# Patient Record
Sex: Female | Born: 1990 | Race: Black or African American | Hispanic: No | Marital: Single | Smoking: Never smoker
Health system: Southern US, Community
[De-identification: ages and names within clinical notes are randomized; demographics above are authoritative.]

## PROBLEM LIST (undated history)

## (undated) ENCOUNTER — Inpatient Hospital Stay (HOSPITAL_COMMUNITY): Payer: Self-pay

## (undated) DIAGNOSIS — N3090 Cystitis, unspecified without hematuria: Secondary | ICD-10-CM

## (undated) DIAGNOSIS — I10 Essential (primary) hypertension: Secondary | ICD-10-CM

## (undated) DIAGNOSIS — L509 Urticaria, unspecified: Secondary | ICD-10-CM

## (undated) DIAGNOSIS — IMO0002 Reserved for concepts with insufficient information to code with codable children: Secondary | ICD-10-CM

## (undated) DIAGNOSIS — O1490 Unspecified pre-eclampsia, unspecified trimester: Secondary | ICD-10-CM

## (undated) DIAGNOSIS — T7840XA Allergy, unspecified, initial encounter: Secondary | ICD-10-CM

## (undated) DIAGNOSIS — N390 Urinary tract infection, site not specified: Secondary | ICD-10-CM

## (undated) HISTORY — DX: Unspecified pre-eclampsia, unspecified trimester: O14.90

## (undated) HISTORY — DX: Reserved for concepts with insufficient information to code with codable children: IMO0002

## (undated) HISTORY — PX: TONSILLECTOMY: SUR1361

## (undated) HISTORY — DX: Cystitis, unspecified without hematuria: N30.90

## (undated) HISTORY — DX: Urinary tract infection, site not specified: N39.0

## (undated) HISTORY — DX: Allergy, unspecified, initial encounter: T78.40XA

## (undated) HISTORY — PX: LAPAROSCOPIC GASTRIC SLEEVE RESECTION: SHX5895

## (undated) HISTORY — DX: Urticaria, unspecified: L50.9

---
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
## 2009-01-11 ENCOUNTER — Emergency Department (HOSPITAL_BASED_OUTPATIENT_CLINIC_OR_DEPARTMENT_OTHER): Admission: EM | Admit: 2009-01-11 | Discharge: 2009-01-11 | Payer: Self-pay | Admitting: Emergency Medicine

## 2009-08-22 ENCOUNTER — Emergency Department (HOSPITAL_BASED_OUTPATIENT_CLINIC_OR_DEPARTMENT_OTHER): Admission: EM | Admit: 2009-08-22 | Discharge: 2009-08-22 | Payer: Self-pay | Admitting: Emergency Medicine

## 2009-08-22 ENCOUNTER — Ambulatory Visit: Payer: Self-pay | Admitting: Diagnostic Radiology

## 2010-02-03 ENCOUNTER — Emergency Department (HOSPITAL_COMMUNITY): Admission: EM | Admit: 2010-02-03 | Discharge: 2010-02-03 | Payer: Self-pay | Admitting: Emergency Medicine

## 2010-07-11 ENCOUNTER — Emergency Department (HOSPITAL_BASED_OUTPATIENT_CLINIC_OR_DEPARTMENT_OTHER): Admission: EM | Admit: 2010-07-11 | Discharge: 2010-07-11 | Payer: Self-pay | Admitting: Emergency Medicine

## 2010-07-11 ENCOUNTER — Ambulatory Visit: Payer: Self-pay | Admitting: Diagnostic Radiology

## 2010-08-02 ENCOUNTER — Emergency Department (HOSPITAL_BASED_OUTPATIENT_CLINIC_OR_DEPARTMENT_OTHER): Admission: EM | Admit: 2010-08-02 | Discharge: 2010-08-02 | Payer: Self-pay | Admitting: Emergency Medicine

## 2010-12-20 LAB — RAPID STREP SCREEN (MED CTR MEBANE ONLY): Streptococcus, Group A Screen (Direct): NEGATIVE

## 2010-12-26 LAB — URINALYSIS, ROUTINE W REFLEX MICROSCOPIC
Bilirubin Urine: NEGATIVE
Glucose, UA: NEGATIVE mg/dL
Ketones, ur: NEGATIVE mg/dL
Nitrite: NEGATIVE
Protein, ur: NEGATIVE mg/dL
Specific Gravity, Urine: 1.018 (ref 1.005–1.030)
Urobilinogen, UA: 1 mg/dL (ref 0.0–1.0)
pH: 7.5 (ref 5.0–8.0)

## 2010-12-26 LAB — URINE CULTURE: Colony Count: >=100000

## 2010-12-26 LAB — URINE MICROSCOPIC-ADD ON

## 2010-12-26 LAB — POCT PREGNANCY, URINE: Preg Test, Ur: NEGATIVE

## 2011-01-17 LAB — RAPID STREP SCREEN (MED CTR MEBANE ONLY): Streptococcus, Group A Screen (Direct): NEGATIVE

## 2011-07-05 LAB — URINE MICROSCOPIC-ADD ON

## 2011-07-05 LAB — BASIC METABOLIC PANEL
BUN: 4 — ABNORMAL LOW
CO2: 22
Calcium: 9.1
Chloride: 106
Creatinine, Ser: 0.6
Glucose, Bld: 90
Potassium: 3.4 — ABNORMAL LOW
Sodium: 137

## 2011-07-05 LAB — URINALYSIS, ROUTINE W REFLEX MICROSCOPIC
Bilirubin Urine: NEGATIVE
Glucose, UA: NEGATIVE
Ketones, ur: NEGATIVE
Nitrite: NEGATIVE
Protein, ur: NEGATIVE
Specific Gravity, Urine: 1.023
Urobilinogen, UA: 0.2
pH: 6

## 2011-07-05 LAB — CBC
HCT: 34.1 — ABNORMAL LOW
Hemoglobin: 12
MCHC: 35
MCV: 83.4
Platelets: 199
RBC: 4.09
RDW: 13
WBC: 7.5

## 2011-07-05 LAB — DIFFERENTIAL
Basophils Absolute: 0
Basophils Relative: 1
Eosinophils Absolute: 0.2
Eosinophils Relative: 3
Lymphocytes Relative: 22 — ABNORMAL LOW
Lymphs Abs: 1.7
Monocytes Absolute: 0.3
Monocytes Relative: 5
Neutro Abs: 5.3
Neutrophils Relative %: 70

## 2011-07-06 LAB — URINALYSIS, ROUTINE W REFLEX MICROSCOPIC
Bilirubin Urine: NEGATIVE
Glucose, UA: NEGATIVE
Hgb urine dipstick: NEGATIVE
Ketones, ur: NEGATIVE
Nitrite: NEGATIVE
Protein, ur: NEGATIVE
Specific Gravity, Urine: 1.02
Urobilinogen, UA: 0.2
pH: 6.5

## 2011-07-06 LAB — COMPREHENSIVE METABOLIC PANEL
ALT: 23
AST: 22
Albumin: 2.7 — ABNORMAL LOW
Alkaline Phosphatase: 57
BUN: 2 — ABNORMAL LOW
CO2: 22
Calcium: 8.9
Chloride: 104
Creatinine, Ser: 0.42
Glucose, Bld: 109 — ABNORMAL HIGH
Potassium: 3.3 — ABNORMAL LOW
Sodium: 134 — ABNORMAL LOW
Total Bilirubin: 0.4
Total Protein: 6

## 2011-07-06 LAB — CBC
HCT: 33.3 — ABNORMAL LOW
Hemoglobin: 11.3 — ABNORMAL LOW
MCHC: 34
MCV: 87.5
Platelets: 201
RBC: 3.8
RDW: 14.7
WBC: 7.7

## 2011-07-06 LAB — URIC ACID: Uric Acid, Serum: 3.3

## 2011-07-09 LAB — URINALYSIS, ROUTINE W REFLEX MICROSCOPIC
Bilirubin Urine: NEGATIVE
Glucose, UA: NEGATIVE
Ketones, ur: NEGATIVE
Nitrite: NEGATIVE
Protein, ur: NEGATIVE
Specific Gravity, Urine: 1.02
Urobilinogen, UA: 0.2
pH: 6

## 2011-07-09 LAB — URINE CULTURE

## 2011-07-09 LAB — URINE MICROSCOPIC-ADD ON

## 2011-07-10 LAB — DIFFERENTIAL
Basophils Absolute: 0
Basophils Relative: 0
Eosinophils Absolute: 0.1
Eosinophils Relative: 1
Lymphs Abs: 1.4
Monocytes Relative: 10
Neutrophils Relative %: 76

## 2011-07-10 LAB — CBC
MCHC: 32.9
MCV: 81
RBC: 3.95
RDW: 15.3

## 2011-07-10 LAB — BASIC METABOLIC PANEL
BUN: 11
CO2: 25
Calcium: 8.8
Chloride: 103
Creatinine, Ser: 0.7
Glucose, Bld: 77

## 2011-12-20 ENCOUNTER — Emergency Department (HOSPITAL_BASED_OUTPATIENT_CLINIC_OR_DEPARTMENT_OTHER)
Admission: EM | Admit: 2011-12-20 | Discharge: 2011-12-20 | Disposition: A | Discharge status: Home / Self Care | Payer: Medicaid Other | Attending: Emergency Medicine | Admitting: Emergency Medicine

## 2011-12-20 ENCOUNTER — Encounter (HOSPITAL_BASED_OUTPATIENT_CLINIC_OR_DEPARTMENT_OTHER): Payer: Self-pay | Admitting: Family Medicine

## 2011-12-20 ENCOUNTER — Emergency Department (INDEPENDENT_AMBULATORY_CARE_PROVIDER_SITE_OTHER): Payer: Medicaid Other

## 2011-12-20 DIAGNOSIS — R52 Pain, unspecified: Secondary | ICD-10-CM

## 2011-12-20 DIAGNOSIS — J069 Acute upper respiratory infection, unspecified: Secondary | ICD-10-CM

## 2011-12-20 DIAGNOSIS — R0989 Other specified symptoms and signs involving the circulatory and respiratory systems: Secondary | ICD-10-CM

## 2011-12-20 DIAGNOSIS — H6690 Otitis media, unspecified, unspecified ear: Secondary | ICD-10-CM | POA: Insufficient documentation

## 2011-12-20 DIAGNOSIS — R059 Cough, unspecified: Secondary | ICD-10-CM | POA: Insufficient documentation

## 2011-12-20 DIAGNOSIS — R05 Cough: Secondary | ICD-10-CM

## 2011-12-20 DIAGNOSIS — H9209 Otalgia, unspecified ear: Secondary | ICD-10-CM

## 2011-12-20 DIAGNOSIS — H6692 Otitis media, unspecified, left ear: Secondary | ICD-10-CM

## 2011-12-20 LAB — CBC
MCH: 28.3 pg (ref 26.0–34.0)
MCHC: 34.4 g/dL (ref 30.0–36.0)
Platelets: 206 10*3/uL (ref 150–400)
RBC: 4.91 MIL/uL (ref 3.87–5.11)

## 2011-12-20 LAB — BASIC METABOLIC PANEL
CO2: 28 mEq/L (ref 19–32)
Calcium: 9.5 mg/dL (ref 8.4–10.5)
GFR calc non Af Amer: >90 mL/min (ref >90)
Sodium: 138 mEq/L (ref 135–145)

## 2011-12-20 LAB — URINALYSIS, ROUTINE W REFLEX MICROSCOPIC
Ketones, ur: NEGATIVE mg/dL
Leukocytes, UA: NEGATIVE
Nitrite: NEGATIVE
Protein, ur: NEGATIVE mg/dL
Urobilinogen, UA: 0.2 mg/dL (ref 0.0–1.0)

## 2011-12-20 LAB — PREGNANCY, URINE: Preg Test, Ur: NEGATIVE

## 2011-12-20 MED ORDER — NAPROXEN 500 MG PO TABS: 500.0000 mg | ORAL_TABLET | Freq: Two times a day (BID) | ORAL | Status: DC

## 2011-12-20 MED ORDER — AZITHROMYCIN 250 MG PO TABS: 250.0000 mg | ORAL_TABLET | Freq: Every day | ORAL | Status: AC

## 2011-12-20 NOTE — ED Provider Notes (Addendum)
History     CSN: [id number]  Arrival date & time 12/20/11  1805   First MD Initiated Contact with Patient 12/20/11 1823      Chief Complaint  Patient presents with  . Otalgia  . Generalized Body Aches  . Cough  . Nasal Congestion    (Consider location/radiation/quality/duration/timing/severity/associated sxs/prior treatment) Patient is a 21 y.o. female presenting with ear pain and cough. The history is provided by the patient.  Otalgia This is a new problem. The current episode started more than 2 days ago (5 days ago). There is pain in the left ear. The problem occurs constantly. The problem has been gradually worsening. There has been no fever. The pain is at a severity of 2/10. The pain is mild. Associated symptoms include headaches, sore throat and cough. Pertinent negatives include no ear discharge, no abdominal pain, no diarrhea, no vomiting, no neck pain and no rash.  Cough Associated symptoms include chest pain, ear pain, headaches, sore throat and myalgias. Pertinent negatives include no chills and no eye redness.   Patient with onset of upper respiratory viral infection symptoms 5 days ago she has some mild headache bodyaches congestion left ear pain and upper chest is burning when she breathes no true fever but she does get sweaty no chills.  History reviewed. No pertinent past medical history.  History reviewed. No pertinent past surgical history.  No family history on file.  History  Substance Use Topics  . Smoking status: Never Smoker   . Smokeless tobacco: Not on file  . Alcohol Use: No    OB History    Grav Para Term Preterm Abortions TAB SAB Ect Mult Living                  Review of Systems  Constitutional: Positive for diaphoresis and fatigue. Negative for fever and chills.  HENT: Positive for ear pain, congestion and sore throat. Negative for neck pain and ear discharge.   Eyes: Negative for redness.  Respiratory: Positive for cough.     Cardiovascular: Positive for chest pain.  Gastrointestinal: Negative for nausea, vomiting, abdominal pain and diarrhea.  Genitourinary: Negative for dysuria.  Musculoskeletal: Positive for myalgias and back pain.  Skin: Negative for rash.  Neurological: Positive for headaches.  Hematological: Does not bruise/bleed easily.    Allergies  Review of patient's allergies indicates no known allergies.  Home Medications   Current Outpatient Rx  Name Route Sig Dispense Refill  . ADULT MULTIVITAMIN W/MINERALS CH Oral Take 1 tablet by mouth daily.    Marland Kitchen PSEUDOEPHEDRINE-APAP-DM 30-865-78 MG/30ML PO LIQD Oral Take 10 mLs by mouth daily as needed. Patient used this medication for cold.    . AZITHROMYCIN 250 MG PO TABS Oral Take 1 tablet (250 mg total) by mouth daily. Take first 2 tablets together, then 1 every day until finished. 6 tablet 0  . NAPROXEN 500 MG PO TABS Oral Take 1 tablet (500 mg total) by mouth 2 (two) times daily. 14 tablet 0    BP 140/79  Pulse 71  Temp(Src) 97.7 F (36.5 C) (Oral)  Resp 16  Ht 5\' 6"  (1.676 m)  Wt 250 lb (113.399 kg)  BMI 40.35 kg/m2  SpO2 100%  LMP 12/06/2011  Physical Exam  Nursing note and vitals reviewed. Constitutional: She is oriented to person, place, and time. She appears well-developed and well-nourished. No distress.  HENT:  Head: Normocephalic and atraumatic.  Mouth/Throat: Oropharynx is clear and moist.  Eyes: Conjunctivae and EOM are  normal. Pupils are equal, round, and reactive to light.  Neck: Normal range of motion. Neck supple.  Cardiovascular: Normal rate, regular rhythm, normal heart sounds and intact distal pulses.   No murmur heard. Pulmonary/Chest: Effort normal and breath sounds normal. She has no wheezes. She has no rales. She exhibits no tenderness.  Abdominal: Soft. Bowel sounds are normal. There is no tenderness.  Musculoskeletal: Normal range of motion. She exhibits no edema and no tenderness.  Lymphadenopathy:    She has  no cervical adenopathy.  Neurological: She is alert and oriented to person, place, and time. No cranial nerve deficit. She exhibits normal muscle tone. Coordination normal.  Skin: Skin is warm. No rash noted. She is not diaphoretic.    ED Course  Procedures (including critical care time)   Labs Reviewed  CBC  BASIC METABOLIC PANEL  PREGNANCY, URINE  URINALYSIS, ROUTINE W REFLEX MICROSCOPIC   Dg Chest 2 View  12/20/2011  *RADIOLOGY REPORT*  Clinical Data: Cough.  Congestion.  Bodyaches.  CHEST - 2 VIEW  Comparison: None.  Findings: Midline trachea.  Normal heart size and mediastinal contours. No pleural effusion or pneumothorax.  Mildly low lung volumes. Clear lungs.  IMPRESSION: No acute cardiopulmonary disease.  Original Report Authenticated By: Consuello Bossier, M.D.   Results for orders placed during the hospital encounter of 12/20/11  CBC      Component Value Range   WBC 4.8  4.0 - 10.5 (K/uL)   RBC 4.91  3.87 - 5.11 (MIL/uL)   Hemoglobin 13.9  12.0 - 15.0 (g/dL)   HCT 04.5  40.9 - 81.1 (%)   MCV 82.3  78.0 - 100.0 (fL)   MCH 28.3  26.0 - 34.0 (pg)   MCHC 34.4  30.0 - 36.0 (g/dL)   RDW 91.4  78.2 - 95.6 (%)   Platelets 206  150 - 400 (K/uL)  BASIC METABOLIC PANEL      Component Value Range   Sodium 138  135 - 145 (mEq/L)   Potassium 3.7  3.5 - 5.1 (mEq/L)   Chloride 101  96 - 112 (mEq/L)   CO2 28  19 - 32 (mEq/L)   Glucose, Bld 77  70 - 99 (mg/dL)   BUN 9  6 - 23 (mg/dL)   Creatinine, Ser 2.13  0.50 - 1.10 (mg/dL)   Calcium 9.5  8.4 - 08.6 (mg/dL)   GFR calc non Af Amer >90  >90 (mL/min)   GFR calc Af Amer >90  >90 (mL/min)  PREGNANCY, URINE      Component Value Range   Preg Test, Ur NEGATIVE  NEGATIVE   URINALYSIS, ROUTINE W REFLEX MICROSCOPIC      Component Value Range   Color, Urine YELLOW  YELLOW    APPearance CLEAR  CLEAR    Specific Gravity, Urine 1.027  1.005 - 1.030    pH 6.0  5.0 - 8.0    Glucose, UA NEGATIVE  NEGATIVE (mg/dL)   Hgb urine dipstick  NEGATIVE  NEGATIVE    Bilirubin Urine NEGATIVE  NEGATIVE    Ketones, ur NEGATIVE  NEGATIVE (mg/dL)   Protein, ur NEGATIVE  NEGATIVE (mg/dL)   Urobilinogen, UA 0.2  0.0 - 1.0 (mg/dL)   Nitrite NEGATIVE  NEGATIVE    Leukocytes, UA NEGATIVE  NEGATIVE      1. Upper respiratory infection   2. Otitis media of left ear       MDM  Symptoms consistent with viral upper respiratory infection early left otitis media  with TM redness will be treated with an antibiotic chest x-ray negative for pneumonia basic labs without any significant abnormalities pregnancy test is negative.        Shelda Jakes, MD 12/20/11 2012  Shelda Jakes, MD 12/20/11 2012

## 2011-12-20 NOTE — Discharge Instructions (Signed)
Take antibiotic as directed for the left early urine infection take Naprosyn as directed. Chest x-ray was negative for pneumonia. Suspect upper respiratory infection of viral nature. Return for new or worse symptoms.

## 2011-12-20 NOTE — ED Notes (Signed)
Pt c/o runny nose, cough, congestion and "chest cold" x 5 days. Pt sts she is afraid she has pneumonia.

## 2012-07-18 ENCOUNTER — Inpatient Hospital Stay (HOSPITAL_COMMUNITY): Payer: Medicaid Other

## 2012-07-18 ENCOUNTER — Encounter (HOSPITAL_COMMUNITY): Payer: Self-pay | Admitting: *Deleted

## 2012-07-18 ENCOUNTER — Inpatient Hospital Stay (HOSPITAL_COMMUNITY)
Admission: AD | Admit: 2012-07-18 | Discharge: 2012-07-18 | Disposition: A | Discharge status: Home / Self Care | Payer: Medicaid Other | Source: Ambulatory Visit | Attending: Family Medicine | Admitting: Family Medicine

## 2012-07-18 DIAGNOSIS — O99891 Other specified diseases and conditions complicating pregnancy: Secondary | ICD-10-CM | POA: Insufficient documentation

## 2012-07-18 DIAGNOSIS — O26899 Other specified pregnancy related conditions, unspecified trimester: Secondary | ICD-10-CM

## 2012-07-18 DIAGNOSIS — R197 Diarrhea, unspecified: Secondary | ICD-10-CM | POA: Insufficient documentation

## 2012-07-18 DIAGNOSIS — Z3201 Encounter for pregnancy test, result positive: Secondary | ICD-10-CM

## 2012-07-18 DIAGNOSIS — R109 Unspecified abdominal pain: Secondary | ICD-10-CM | POA: Insufficient documentation

## 2012-07-18 LAB — URINALYSIS, ROUTINE W REFLEX MICROSCOPIC
Bilirubin Urine: NEGATIVE
Ketones, ur: 15 mg/dL — AB
Nitrite: NEGATIVE
Urobilinogen, UA: 0.2 mg/dL (ref 0.0–1.0)

## 2012-07-18 LAB — WET PREP, GENITAL: Trich, Wet Prep: NONE SEEN

## 2012-07-18 MED ORDER — PRENATAL VITAMINS 28-0.8 MG PO TABS: 1.0000 | ORAL_TABLET | Freq: Every day | ORAL | Status: DC

## 2012-07-18 MED ORDER — ONDANSETRON HCL 4 MG PO TABS: 4.0000 mg | ORAL_TABLET | Freq: Three times a day (TID) | ORAL | Status: DC | PRN

## 2012-07-18 NOTE — MAU Provider Note (Signed)
Chart reviewed and agree with management and plan.  

## 2012-07-18 NOTE — MAU Provider Note (Signed)
History     CSN: [id number]  Arrival date and time: 07/18/12 4098   First Provider Initiated Contact with Patient 07/18/12 0932      Chief Complaint  Patient presents with  . Possible Pregnancy  . Abdominal Cramping  Cathy Webster 21 y.o. G2P1001 [redacted]w[redacted]d by questionable LMP.    HPI  Possible pregnancy- Patient presents today after having positive home pregnancy test. She was not trying to get pregnant. She was using natural family planning based on her cycles. She reports that her LMP is questionable as September 9th because it was not "normal" with only 3 days of light spotting. Patient reports having diffuse abdominal cramping. She has had no vaginal bleeding or discharge.  She has no current plans for prenatal care and would like a list of providers. She also reports needing prenatal vitamins. Patient reports no complication with previous pregnancy besides unplanned C-section due to prolonged labor and drops in fetal heart rate.   Diarrhea-  Patient has acute complaint of diarrhea with diffuse abdominal cramping for 3-4 days. Stools have been loose 2-3 times per day. She has had some nausea. She has had no blood in stool, fever, chills, vomiting.   She denies vaginal bleeding, vaginal itching/burning, urinary symptoms, h/a, or dizziness.   OB History    Grav Para Term Preterm Abortions TAB SAB Ect Mult Living   2 1 1       1       Past Medical History  Diagnosis Date  . No pertinent past medical history     Past Surgical History  Procedure Date  . Cesarean section   . Tonsillectomy     No family history on file.  History  Substance Use Topics  . Smoking status: Never Smoker   . Smokeless tobacco: Not on file  . Alcohol Use: No    Allergies: No Known Allergies  No prescriptions prior to admission    Review of Systems  Constitutional: Positive for diaphoresis (some sweating at night with diarrhea ). Negative for fever and chills.  Eyes: Negative for  blurred vision and double vision.  Respiratory: Negative for shortness of breath.   Cardiovascular: Negative for chest pain, palpitations and leg swelling.  Gastrointestinal: Positive for nausea, abdominal pain (diffuse cramping) and diarrhea. Negative for vomiting, constipation and blood in stool.  Genitourinary: Negative for dysuria, urgency and frequency.  Musculoskeletal: Negative for myalgias.  Skin: Negative for rash.  Neurological: Negative for dizziness, loss of consciousness, weakness and headaches.   Physical Exam   Blood pressure 121/51, pulse 85, temperature 98.3 F (36.8 C), temperature source Oral, resp. rate 18, height 5\' 4"  (1.626 m), weight 125.193 kg (276 lb), last menstrual period 06/16/2012, SpO2 100.00%.  Physical Exam  Nursing note and vitals reviewed. Constitutional: She is oriented to person, place, and time. She appears well-developed and well-nourished. No distress.  HENT:  Head: Normocephalic and atraumatic.  Mouth/Throat: Oropharynx is clear and moist.  Eyes: Conjunctivae normal and EOM are normal. Pupils are equal, round, and reactive to light. No scleral icterus.  Neck: Normal range of motion. Neck supple.  Cardiovascular: Normal rate, regular rhythm, normal heart sounds and intact distal pulses.  Exam reveals no gallop and no friction rub.   No murmur heard. Respiratory: Effort normal and breath sounds normal. No respiratory distress. She has no wheezes. She has no rales.  GI: Soft. She exhibits no distension and no mass. There is no hepatosplenomegaly. There is tenderness (mild right sided tenderness). There is  no rebound and no guarding.       Bowel sounds are distant.  Musculoskeletal: Normal range of motion. She exhibits no edema and no tenderness.  Neurological: She is alert and oriented to person, place, and time.  Skin: Skin is warm and dry. No rash noted. She is not diaphoretic. No erythema.  Psychiatric: She has a normal mood and affect. Her  behavior is normal. Thought content normal.    MAU Course  Procedures  Results for orders placed during the hospital encounter of 07/18/12 (from the past 24 hour(s))  URINALYSIS, ROUTINE W REFLEX MICROSCOPIC     Status: Abnormal   Collection Time   07/18/12  9:00 AM      Component Value Range   Color, Urine YELLOW  YELLOW   APPearance CLEAR  CLEAR   Specific Gravity, Urine 1.025  1.005 - 1.030   pH 6.0  5.0 - 8.0   Glucose, UA NEGATIVE  NEGATIVE mg/dL   Hgb urine dipstick NEGATIVE  NEGATIVE   Bilirubin Urine NEGATIVE  NEGATIVE   Ketones, ur 15 (*) NEGATIVE mg/dL   Protein, ur NEGATIVE  NEGATIVE mg/dL   Urobilinogen, UA 0.2  0.0 - 1.0 mg/dL   Nitrite NEGATIVE  NEGATIVE   Leukocytes, UA NEGATIVE  NEGATIVE  POCT PREGNANCY, URINE     Status: Abnormal   Collection Time   07/18/12  9:08 AM      Component Value Range   Preg Test, Ur POSITIVE (*) NEGATIVE  HCG, QUANTITATIVE, PREGNANCY     Status: Abnormal   Collection Time   07/18/12 10:10 AM      Component Value Range   hCG, Beta Chain, Quant, S 436 (*) <5 mIU/mL  WET PREP, GENITAL     Status: Abnormal   Collection Time   07/18/12 11:15 AM      Component Value Range   Yeast Wet Prep HPF POC NONE SEEN  NONE SEEN   Trich, Wet Prep NONE SEEN  NONE SEEN   Clue Cells Wet Prep HPF POC FEW (*) NONE SEEN   WBC, Wet Prep HPF POC FEW (*) NONE SEEN        GC/Chlamydia- results pending.    Assessment and Plan  Abdominal pain in early pregnancy- hCG quant was 436. Pregnancy was too early to rule out ectopic pregnancy. We will have her return in 48hours for repeat hCG and further evaluation. Patient instructed to return before then if she begins to have more abdominal pain, fever/chill, NV.  Once IUP is confirmed patient would like to be scheduled to [facility] for prenatal care.  We will prescribe for her prenatal vitamins to begin today and Zofran 4mg  PO to take as needed for nausea.  GC/Chlamydia results pending.  Will follow up with results as needed.   Acute diarrhea- Signs and symptoms indicate that this is likely viral in nature. Patient encouraged to maintain hydration and return if not getting better in 3-5 days.    Winfield Cunas 07/18/2012, 9:43 AM   I have seen this patient and agree with the above PA student's note.  LEFTWICH-KIRBY, Koen Antilla Certified Nurse-Midwife

## 2012-07-18 NOTE — MAU Note (Signed)
Patient states she had a positive home pregnancy test about 3-4 days ago. Has been having abdominal cramping and loose stool for about 3-4 days. Nausea, no vomiting. Denies any bleeding or vaginal discharge.

## 2012-07-20 ENCOUNTER — Inpatient Hospital Stay (HOSPITAL_COMMUNITY)
Admission: AD | Admit: 2012-07-20 | Discharge: 2012-07-20 | Disposition: A | Discharge status: Home / Self Care | Payer: Medicaid Other | Source: Ambulatory Visit | Attending: Obstetrics and Gynecology | Admitting: Obstetrics and Gynecology

## 2012-07-20 DIAGNOSIS — O99891 Other specified diseases and conditions complicating pregnancy: Secondary | ICD-10-CM | POA: Insufficient documentation

## 2012-07-20 DIAGNOSIS — R109 Unspecified abdominal pain: Secondary | ICD-10-CM | POA: Insufficient documentation

## 2012-07-20 DIAGNOSIS — O26899 Other specified pregnancy related conditions, unspecified trimester: Secondary | ICD-10-CM

## 2012-07-20 LAB — HCG, QUANTITATIVE, PREGNANCY: hCG, Beta Chain, Quant, S: 1120 m[IU]/mL — ABNORMAL HIGH (ref <5)

## 2012-07-20 NOTE — MAU Note (Signed)
Patient to MAU for repeat BHCG. Denies any pain or bleeding but does continue to have diarrhea.

## 2012-07-28 ENCOUNTER — Encounter (HOSPITAL_COMMUNITY): Payer: Self-pay | Admitting: Advanced Practice Midwife

## 2012-07-28 ENCOUNTER — Inpatient Hospital Stay (HOSPITAL_COMMUNITY)
Admission: AD | Admit: 2012-07-28 | Discharge: 2012-07-28 | Disposition: A | Discharge status: Home / Self Care | Payer: Medicaid Other | Source: Ambulatory Visit | Attending: Obstetrics and Gynecology | Admitting: Obstetrics and Gynecology

## 2012-07-28 ENCOUNTER — Ambulatory Visit (HOSPITAL_COMMUNITY)
Admission: RE | Admit: 2012-07-28 | Discharge: 2012-07-28 | Disposition: A | Discharge status: Home / Self Care | Payer: Medicaid Other | Source: Ambulatory Visit | Attending: Advanced Practice Midwife | Admitting: Advanced Practice Midwife

## 2012-07-28 DIAGNOSIS — O99891 Other specified diseases and conditions complicating pregnancy: Secondary | ICD-10-CM | POA: Insufficient documentation

## 2012-07-28 DIAGNOSIS — R109 Unspecified abdominal pain: Secondary | ICD-10-CM | POA: Insufficient documentation

## 2012-07-28 DIAGNOSIS — Z1389 Encounter for screening for other disorder: Secondary | ICD-10-CM

## 2012-07-28 DIAGNOSIS — O26899 Other specified pregnancy related conditions, unspecified trimester: Secondary | ICD-10-CM

## 2012-07-28 DIAGNOSIS — Z3689 Encounter for other specified antenatal screening: Secondary | ICD-10-CM | POA: Insufficient documentation

## 2012-07-28 DIAGNOSIS — Z3490 Encounter for supervision of normal pregnancy, unspecified, unspecified trimester: Secondary | ICD-10-CM

## 2012-07-28 DIAGNOSIS — O3680X0 Pregnancy with inconclusive fetal viability, not applicable or unspecified: Secondary | ICD-10-CM | POA: Insufficient documentation

## 2012-07-28 NOTE — MAU Note (Signed)
Patient to MAU after ultrasound. Patient denies any pain or bleeding.  

## 2012-08-06 ENCOUNTER — Ambulatory Visit (INDEPENDENT_AMBULATORY_CARE_PROVIDER_SITE_OTHER): Payer: Medicaid Other | Admitting: Obstetrics and Gynecology

## 2012-08-06 ENCOUNTER — Other Ambulatory Visit (HOSPITAL_COMMUNITY)
Admission: RE | Admit: 2012-08-06 | Discharge: 2012-08-06 | Disposition: A | Discharge status: Home / Self Care | Payer: Medicaid Other | Source: Ambulatory Visit | Attending: Obstetrics and Gynecology | Admitting: Obstetrics and Gynecology

## 2012-08-06 ENCOUNTER — Encounter: Payer: Self-pay | Admitting: Obstetrics and Gynecology

## 2012-08-06 VITALS — BP 126/75 | Temp 96.9°F | Wt 273.6 lb

## 2012-08-06 DIAGNOSIS — Z113 Encounter for screening for infections with a predominantly sexual mode of transmission: Secondary | ICD-10-CM | POA: Insufficient documentation

## 2012-08-06 DIAGNOSIS — Z01419 Encounter for gynecological examination (general) (routine) without abnormal findings: Secondary | ICD-10-CM | POA: Insufficient documentation

## 2012-08-06 DIAGNOSIS — O9989 Other specified diseases and conditions complicating pregnancy, childbirth and the puerperium: Secondary | ICD-10-CM

## 2012-08-06 DIAGNOSIS — R109 Unspecified abdominal pain: Secondary | ICD-10-CM

## 2012-08-06 DIAGNOSIS — Z3480 Encounter for supervision of other normal pregnancy, unspecified trimester: Secondary | ICD-10-CM | POA: Insufficient documentation

## 2012-08-06 LAB — POCT URINALYSIS DIP (DEVICE)
Glucose, UA: NEGATIVE mg/dL
Hgb urine dipstick: NEGATIVE
Ketones, ur: 40 mg/dL — AB
Protein, ur: NEGATIVE mg/dL
Specific Gravity, Urine: 1.025 (ref 1.005–1.030)

## 2012-08-06 MED ORDER — CONCEPT OB 130-92.4-1 MG PO CAPS: 1.0000 | ORAL_CAPSULE | ORAL | Status: DC

## 2012-08-06 NOTE — Progress Notes (Signed)
   Subjective:    Cathy Webster is a G2P1001 [redacted]w[redacted]d being seen today for her first obstetrical visit.  Her obstetrical history is significant for obesity. Patient does intend to breast feed. Pregnancy history fully reviewed.  Patient reports no complaints.  Filed Vitals:   08/06/12 0837  BP: 126/75  Temp: 96.9 F (36.1 C)  Weight: 273 lb 9.6 oz (124.104 kg)    HISTORY: OB History    Grav Para Term Preterm Abortions TAB SAB Ect Mult Living   2 1 1       1      # Outc Date GA Lbr Len/2nd Wgt Sex Del Anes PTL Lv   1 TRM 10/09 [redacted]w[redacted]d  6lb14oz(3.118kg) F CS EPI  Yes   2 CUR              Past Medical History  Diagnosis Date  . Abnormal Pap smear 2008   Past Surgical History  Procedure Date  . Cesarean section   . Tonsillectomy    Family History  Problem Relation Age of Onset  . Diabetes Mother   . Asthma Brother      Exam    Uterus:     Pelvic Exam:    Perineum: No Hemorrhoids   Vulva: normal, Bartholin's, Urethra, Skene's normal   Vagina:  normal mucosa       Cervix: no lesions   Adnexa: no mass, fullness, tenderness   Bony Pelvis: gynecoid  System: Breast:  normal appearance, no masses or tenderness, Inspection negative   Skin: normal coloration and turgor, no rashes    Neurologic: oriented, normal, normal mood   Extremities: no deformities, gait was normal for age   HEENT PERRLA and thyroid without masses   Mouth/Teeth mucous membranes moist, pharynx normal without lesions   Neck supple   Cardiovascular: regular rate and rhythm   Respiratory:  appears well, vitals normal, no respiratory distress, acyanotic, normal RR, ear and throat exam is normal, neck free of mass or lymphadenopathy, chest clear, no wheezing, crepitations, rhonchi, normal symmetric air entry   Abdomen: soft, obese NT   Urinary: urethral meatus normal      Assessment:    Pregnancy: G2P1001 Patient Active Problem List  Diagnosis  . Positive pregnancy test  . Abdominal pain in  pregnancy  . Supervision of normal subsequent pregnancy  G2P1001 at [redacted]w[redacted]d viable IUP      Plan:     Initial labs drawn. Glucola d/t RF Prenatal vitamins. Problem list reviewed and updated. Genetic Screening discussed First Screen and Integrated Screen: declined.  Ultrasound discussed; fetal survey: requested.  Follow up in 4 weeks. 50 % of 25 min visit spent on counseling and coordination of care.    Cuyler Vandyken 08/06/2012

## 2012-08-06 NOTE — Patient Instructions (Signed)
Pregnancy - First Trimester During sexual intercourse, millions of sperm go into the vagina. Only 1 sperm will penetrate and fertilize the female egg while it is in the Fallopian tube. One week later, the fertilized egg implants into the wall of the uterus. An embryo begins to develop into a baby. At 6 to 8 weeks, the eyes and face are formed and the heartbeat can be seen on ultrasound. At the end of 12 weeks (first trimester), all the baby's organs are formed. Now that you are pregnant, you will want to do everything you can to have a healthy baby. Two of the most important things are to get good prenatal care and follow your caregiver's instructions. Prenatal care is all the medical care you receive before the baby's birth. It is given to prevent, find, and treat problems during the pregnancy and childbirth. PRENATAL EXAMS  During prenatal visits, your weight, blood pressure and urine are checked. This is done to make sure you are healthy and progressing normally during the pregnancy.  A pregnant woman should gain 25 to 35 pounds during the pregnancy. However, if you are over weight or underweight, your caregiver will advise you regarding your weight.  Your caregiver will ask and answer questions for you.  Blood work, cervical cultures, other necessary tests and a Pap test are done during your prenatal exams. These tests are done to check on your health and the probable health of your baby. Tests are strongly recommended and done for HIV with your permission. This is the virus that causes AIDS. These tests are done because medications can be given to help prevent your baby from being born with this infection should you have been infected without knowing it. Blood work is also used to find out your blood type, previous infections and follow your blood levels (hemoglobin).  Low hemoglobin (anemia) is common during pregnancy. Iron and vitamins are given to help prevent this. Later in the pregnancy, blood  tests for diabetes will be done along with any other tests if any problems develop. You may need tests to make sure you and the baby are doing well.  You may need other tests to make sure you and the baby are doing well. CHANGES DURING THE FIRST TRIMESTER (THE FIRST 3 MONTHS OF PREGNANCY) Your body goes through many changes during pregnancy. They vary from person to person. Talk to your caregiver about changes you notice and are concerned about. Changes can include:  Your menstrual period stops.  The egg and sperm carry the genes that determine what you look like. Genes from you and your partner are forming a baby. The female genes determine whether the baby is a boy or a girl.  Your body increases in girth and you may feel bloated.  Feeling sick to your stomach (nauseous) and throwing up (vomiting). If the vomiting is uncontrollable, call your caregiver.  Your breasts will begin to enlarge and become tender.  Your nipples may stick out more and become darker.  The need to urinate more. Painful urination may mean you have a bladder infection.  Tiring easily.  Loss of appetite.  Cravings for certain kinds of food.  At first, you may gain or lose a couple of pounds.  You may have changes in your emotions from day to day (excited to be pregnant or concerned something may go wrong with the pregnancy and baby).  You may have more vivid and strange dreams. HOME CARE INSTRUCTIONS   It is very important   to avoid all smoking, alcohol and un-prescribed drugs during your pregnancy. These affect the formation and growth of the baby. Avoid chemicals while pregnant to ensure the delivery of a healthy infant.  Start your prenatal visits by the 12th week of pregnancy. They are usually scheduled monthly at first, then more often in the last 2 months before delivery. Keep your caregiver's appointments. Follow your caregiver's instructions regarding medication use, blood and lab tests, exercise, and  diet.  During pregnancy, you are providing food for you and your baby. Eat regular, well-balanced meals. Choose foods such as meat, fish, milk and other low fat dairy products, vegetables, fruits, and whole-grain breads and cereals. Your caregiver will tell you of the ideal weight gain.  You can help morning sickness by keeping soda crackers at the bedside. Eat a couple before arising in the morning. You may want to use the crackers without salt on them.  Eating 4 to 5 small meals rather than 3 large meals a day also may help the nausea and vomiting.  Drinking liquids between meals instead of during meals also seems to help nausea and vomiting.  A physical sexual relationship may be continued throughout pregnancy if there are no other problems. Problems may be early (premature) leaking of amniotic fluid from the membranes, vaginal bleeding, or belly (abdominal) pain.  Exercise regularly if there are no restrictions. Check with your caregiver or physical therapist if you are unsure of the safety of some of your exercises. Greater weight gain will occur in the last 2 trimesters of pregnancy. Exercising will help:  Control your weight.  Keep you in shape.  Prepare you for labor and delivery.  Help you lose your pregnancy weight after you deliver your baby.  Wear a good support or jogging bra for breast tenderness during pregnancy. This may help if worn during sleep too.  Ask when prenatal classes are available. Begin classes when they are offered.  Do not use hot tubs, steam rooms or saunas.  Wear your seat belt when driving. This protects you and your baby if you are in an accident.  Avoid raw meat, uncooked cheese, cat litter boxes and soil used by cats throughout the pregnancy. These carry germs that can cause birth defects in the baby.  The first trimester is a good time to visit your dentist for your dental health. Getting your teeth cleaned is OK. Use a softer toothbrush and brush  gently during pregnancy.  Ask for help if you have financial, counseling or nutritional needs during pregnancy. Your caregiver will be able to offer counseling for these needs as well as refer you for other special needs.  Do not take any medications or herbs unless told by your caregiver.  Inform your caregiver if there is any mental or physical domestic violence.  Make a list of emergency phone numbers of family, friends, hospital, and police and fire departments.  Write down your questions. Take them to your prenatal visit.  Do not douche.  Do not cross your legs.  If you have to stand for long periods of time, rotate you feet or take small steps in a circle.  You may have more vaginal secretions that may require a sanitary pad. Do not use tampons or scented sanitary pads. MEDICATIONS AND DRUG USE IN PREGNANCY  Take prenatal vitamins as directed. The vitamin should contain 1 milligram of folic acid. Keep all vitamins out of reach of children. Only a couple vitamins or tablets containing iron may be   fatal to a baby or young child when ingested.  Avoid use of all medications, including herbs, over-the-counter medications, not prescribed or suggested by your caregiver. Only take over-the-counter or prescription medicines for pain, discomfort, or fever as directed by your caregiver. Do not use aspirin, ibuprofen, or naproxen unless directed by your caregiver.  Let your caregiver also know about herbs you may be using.  Alcohol is related to a number of birth defects. This includes fetal alcohol syndrome. All alcohol, in any form, should be avoided completely. Smoking will cause low birth rate and premature babies.  Street or illegal drugs are very harmful to the baby. They are absolutely forbidden. A baby born to an addicted mother will be addicted at birth. The baby will go through the same withdrawal an adult does.  Let your caregiver know about any medications that you have to take  and for what reason you take them. MISCARRIAGE IS COMMON DURING PREGNANCY A miscarriage does not mean you did something wrong. It is not a reason to worry about getting pregnant again. Your caregiver will help you with questions you may have. If you have a miscarriage, you may need minor surgery. SEEK MEDICAL CARE IF:  You have any concerns or worries during your pregnancy. It is better to call with your questions if you feel they cannot wait, rather than worry about them. SEEK IMMEDIATE MEDICAL CARE IF:   An unexplained oral temperature above 102 F (38.9 C) develops, or as your caregiver suggests.  You have leaking of fluid from the vagina (birth canal). If leaking membranes are suspected, take your temperature and inform your caregiver of this when you call.  There is vaginal spotting or bleeding. Notify your caregiver of the amount and how many pads are used.  You develop a bad smelling vaginal discharge with a change in the color.  You continue to feel sick to your stomach (nauseated) and have no relief from remedies suggested. You vomit blood or coffee ground-like materials.  You lose more than 2 pounds of weight in 1 week.  You gain more than 2 pounds of weight in 1 week and you notice swelling of your face, hands, feet, or legs.  You gain 5 pounds or more in 1 week (even if you do not have swelling of your hands, face, legs, or feet).  You get exposed to German measles and have never had them.  You are exposed to fifth disease or chickenpox.  You develop belly (abdominal) pain. Round ligament discomfort is a common non-cancerous (benign) cause of abdominal pain in pregnancy. Your caregiver still must evaluate this.  You develop headache, fever, diarrhea, pain with urination, or shortness of breath.  You fall or are in a car accident or have any kind of trauma.  There is mental or physical violence in your home. Document Released: 09/18/2001 Document Revised: 12/17/2011  Document Reviewed: 03/22/2009 [employer] Patient Information 2013 [employer].  

## 2012-08-06 NOTE — Progress Notes (Signed)
Pulse- 80 Weight gain discussed 11-20 lbs New ob packet given

## 2012-08-07 ENCOUNTER — Emergency Department (HOSPITAL_BASED_OUTPATIENT_CLINIC_OR_DEPARTMENT_OTHER)
Admission: EM | Admit: 2012-08-07 | Discharge: 2012-08-07 | Disposition: A | Discharge status: Home / Self Care | Payer: Medicaid Other | Attending: Emergency Medicine | Admitting: Emergency Medicine

## 2012-08-07 ENCOUNTER — Encounter (HOSPITAL_BASED_OUTPATIENT_CLINIC_OR_DEPARTMENT_OTHER): Payer: Self-pay | Admitting: *Deleted

## 2012-08-07 DIAGNOSIS — O9989 Other specified diseases and conditions complicating pregnancy, childbirth and the puerperium: Secondary | ICD-10-CM | POA: Insufficient documentation

## 2012-08-07 DIAGNOSIS — R51 Headache: Secondary | ICD-10-CM | POA: Insufficient documentation

## 2012-08-07 DIAGNOSIS — J069 Acute upper respiratory infection, unspecified: Secondary | ICD-10-CM | POA: Insufficient documentation

## 2012-08-07 DIAGNOSIS — R5383 Other fatigue: Secondary | ICD-10-CM | POA: Insufficient documentation

## 2012-08-07 DIAGNOSIS — R5381 Other malaise: Secondary | ICD-10-CM | POA: Insufficient documentation

## 2012-08-07 DIAGNOSIS — J029 Acute pharyngitis, unspecified: Secondary | ICD-10-CM | POA: Insufficient documentation

## 2012-08-07 LAB — OBSTETRIC PANEL
Basophils Absolute: 0 10*3/uL (ref 0.0–0.1)
Basophils Relative: 0 % (ref 0–1)
Eosinophils Absolute: 0.1 10*3/uL (ref 0.0–0.7)
Hepatitis B Surface Ag: NEGATIVE
MCH: 28.3 pg (ref 26.0–34.0)
MCHC: 34.4 g/dL (ref 30.0–36.0)
Monocytes Relative: 6 % (ref 3–12)
Neutro Abs: 2.3 10*3/uL (ref 1.7–7.7)
Neutrophils Relative %: 54 % (ref 43–77)
Platelets: 221 10*3/uL (ref 150–400)
RDW: 13.1 % (ref 11.5–15.5)
Rh Type: POSITIVE

## 2012-08-07 LAB — RAPID STREP SCREEN (MED CTR MEBANE ONLY): Streptococcus, Group A Screen (Direct): NEGATIVE

## 2012-08-07 LAB — GLUCOSE TOLERANCE, 1 HOUR (50G) W/O FASTING: Glucose, 1 Hour GTT: 73 mg/dL (ref 70–140)

## 2012-08-07 MED ORDER — ACETAMINOPHEN 325 MG PO TABS
650.0000 mg | ORAL_TABLET | Freq: Once | ORAL | Status: AC
  Administered 2012-08-07: 650 mg via ORAL
  Filled 2012-08-07: qty 2

## 2012-08-07 NOTE — ED Provider Notes (Signed)
History     CSN: [id number]  Arrival date & time 08/07/12  1028   First MD Initiated Contact with Patient 08/07/12 1039      Chief Complaint  Patient presents with  . Otalgia  . Sore Throat    (Consider location/radiation/quality/duration/timing/severity/associated sxs/prior treatment) HPI Pt reports she developed sore throat, ear ache headache and general malaise yesterday evening. She states symptoms have continued this AM. She denies any fever, no vomiting. Symptoms moderate. No particular provoking or relieving factors. She is approx [redacted]wks pregnant, followed at [facility].   Past Medical History  Diagnosis Date  . Abnormal Pap smear 2008    Past Surgical History  Procedure Date  . Cesarean section   . Tonsillectomy     Family History  Problem Relation Age of Onset  . Diabetes Mother   . Asthma Brother     History  Substance Use Topics  . Smoking status: Never Smoker   . Smokeless tobacco: Never Used  . Alcohol Use: No    OB History    Grav Para Term Preterm Abortions TAB SAB Ect Mult Living   2 1 1       1       Review of Systems All other systems reviewed and are negative except as noted in HPI.   Allergies  Review of patient's allergies indicates no known allergies.  Home Medications   Current Outpatient Rx  Name Route Sig Dispense Refill  . ONDANSETRON HCL 4 MG PO TABS Oral Take 1 tablet (4 mg total) by mouth every 8 (eight) hours as needed for nausea. 20 tablet 0  . CONCEPT OB 130-92.4-1 MG PO CAPS Oral Take 1 tablet by mouth 1 day or 1 dose. 30 capsule 5  . PRENATAL VITAMINS 28-0.8 MG PO TABS Oral Take 1 tablet by mouth daily. 30 tablet 10    BP 147/80  Pulse 86  Temp 98.4 F (36.9 C) (Oral)  Resp 20  Ht 5\' 6"  (1.676 m)  Wt 273 lb (123.832 kg)  BMI 44.06 kg/m2  SpO2 100%  LMP 06/16/2012  Physical Exam  Nursing note and vitals reviewed. Constitutional: She is oriented to person, place, and time. She appears well-developed and  well-nourished.  HENT:  Head: Normocephalic and atraumatic.  Mouth/Throat: No oropharyngeal exudate.       TM normal, s/p tonsillectomy, no pharyngeal erythema  Eyes: EOM are normal. Pupils are equal, round, and reactive to light.  Neck: Normal range of motion. Neck supple.  Cardiovascular: Normal rate, normal heart sounds and intact distal pulses.   Pulmonary/Chest: Effort normal and breath sounds normal.  Abdominal: Bowel sounds are normal. She exhibits no distension. There is no tenderness.  Musculoskeletal: Normal range of motion. She exhibits no edema and no tenderness.  Neurological: She is alert and oriented to person, place, and time. She has normal strength. No cranial nerve deficit or sensory deficit.  Skin: Skin is warm and dry. No rash noted.  Psychiatric: She has a normal mood and affect.    ED Course  Procedures (including critical care time)   Labs Reviewed  RAPID STREP SCREEN   No results found.   No diagnosis found.    MDM  Strep neg, likely a viral URI. Advised OTC symptomatic relief.         Amanpreet Delmont B. Bernette Mayers, MD 08/07/12 1200

## 2012-08-07 NOTE — ED Notes (Signed)
Pt reports bilateral ear "pressure" and head "pressure" since going on a field trip with children this week. Also reports occasional cough.

## 2012-08-07 NOTE — Discharge Instructions (Signed)

## 2012-08-08 LAB — HEMOGLOBINOPATHY EVALUATION
Hemoglobin Other: 0 %
Hgb A: 97.4 % (ref 96.8–97.8)

## 2012-08-08 LAB — CULTURE, OB URINE

## 2012-09-03 ENCOUNTER — Other Ambulatory Visit: Payer: Self-pay | Admitting: Obstetrics & Gynecology

## 2012-09-03 ENCOUNTER — Ambulatory Visit (INDEPENDENT_AMBULATORY_CARE_PROVIDER_SITE_OTHER): Payer: Medicaid Other | Admitting: Obstetrics & Gynecology

## 2012-09-03 VITALS — BP 133/73 | Temp 97.3°F | Wt 268.6 lb

## 2012-09-03 DIAGNOSIS — Z3682 Encounter for antenatal screening for nuchal translucency: Secondary | ICD-10-CM

## 2012-09-03 DIAGNOSIS — Z3480 Encounter for supervision of other normal pregnancy, unspecified trimester: Secondary | ICD-10-CM

## 2012-09-03 DIAGNOSIS — O9989 Other specified diseases and conditions complicating pregnancy, childbirth and the puerperium: Secondary | ICD-10-CM

## 2012-09-03 LAB — POCT URINALYSIS DIP (DEVICE)
Glucose, UA: NEGATIVE mg/dL
Hgb urine dipstick: NEGATIVE
Ketones, ur: NEGATIVE mg/dL
Specific Gravity, Urine: 1.02 (ref 1.005–1.030)

## 2012-09-03 NOTE — Patient Instructions (Signed)
Pregnancy - Second Trimester The second trimester of pregnancy (3 to 6 months) is a period of rapid growth for you and your baby. At the end of the sixth month, your baby is about 9 inches long and weighs 1 1/2 pounds. You will begin to feel the baby move between 18 and 20 weeks of the pregnancy. This is called quickening. Weight gain is faster. A clear fluid (colostrum) may leak out of your breasts. You may feel small contractions of the womb (uterus). This is known as false labor or Braxton-Hicks contractions. This is like a practice for labor when the baby is ready to be born. Usually, the problems with morning sickness have usually passed by the end of your first trimester. Some women develop small dark blotches (called cholasma, mask of pregnancy) on their face that usually goes away after the baby is born. Exposure to the sun makes the blotches worse. Acne may also develop in some pregnant women and pregnant women who have acne, may find that it goes away. PRENATAL EXAMS  Blood work may continue to be done during prenatal exams. These tests are done to check on your health and the probable health of your baby. Blood work is used to follow your blood levels (hemoglobin). Anemia (low hemoglobin) is common during pregnancy. Iron and vitamins are given to help prevent this. You will also be checked for diabetes between 24 and 28 weeks of the pregnancy. Some of the previous blood tests may be repeated.  The size of the uterus is measured during each visit. This is to make sure that the baby is continuing to grow properly according to the dates of the pregnancy.  Your blood pressure is checked every prenatal visit. This is to make sure you are not getting toxemia.  Your urine is checked to make sure you do not have an infection, diabetes or protein in the urine.  Your weight is checked often to make sure gains are happening at the suggested rate. This is to ensure that both you and your baby are growing  normally.  Sometimes, an ultrasound is performed to confirm the proper growth and development of the baby. This is a test which bounces harmless sound waves off the baby so your caregiver can more accurately determine due dates. Sometimes, a specialized test is done on the amniotic fluid surrounding the baby. This test is called an amniocentesis. The amniotic fluid is obtained by sticking a needle into the belly (abdomen). This is done to check the chromosomes in instances where there is a concern about possible genetic problems with the baby. It is also sometimes done near the end of pregnancy if an early delivery is required. In this case, it is done to help make sure the baby's lungs are mature enough for the baby to live outside of the womb. CHANGES OCCURING IN THE SECOND TRIMESTER OF PREGNANCY Your body goes through many changes during pregnancy. They vary from person to person. Talk to your caregiver about changes you notice that you are concerned about.  During the second trimester, you will likely have an increase in your appetite. It is normal to have cravings for certain foods. This varies from person to person and pregnancy to pregnancy.  Your lower abdomen will begin to bulge.  You may have to urinate more often because the uterus and baby are pressing on your bladder. It is also common to get more bladder infections during pregnancy (pain with urination). You can help this by   drinking lots of fluids and emptying your bladder before and after intercourse.  You may begin to get stretch marks on your hips, abdomen, and breasts. These are normal changes in the body during pregnancy. There are no exercises or medications to take that prevent this change.  You may begin to develop swollen and bulging veins (varicose veins) in your legs. Wearing support hose, elevating your feet for 15 minutes, 3 to 4 times a day and limiting salt in your diet helps lessen the problem.  Heartburn may develop  as the uterus grows and pushes up against the stomach. Antacids recommended by your caregiver helps with this problem. Also, eating smaller meals 4 to 5 times a day helps.  Constipation can be treated with a stool softener or adding bulk to your diet. Drinking lots of fluids, vegetables, fruits, and whole grains are helpful.  Exercising is also helpful. If you have been very active up until your pregnancy, most of these activities can be continued during your pregnancy. If you have been less active, it is helpful to start an exercise program such as walking.  Hemorrhoids (varicose veins in the rectum) may develop at the end of the second trimester. Warm sitz baths and hemorrhoid cream recommended by your caregiver helps hemorrhoid problems.  Backaches may develop during this time of your pregnancy. Avoid heavy lifting, wear low heal shoes and practice good posture to help with backache problems.  Some pregnant women develop tingling and numbness of their hand and fingers because of swelling and tightening of ligaments in the wrist (carpel tunnel syndrome). This goes away after the baby is born.  As your breasts enlarge, you may have to get a bigger bra. Get a comfortable, cotton, support bra. Do not get a nursing bra until the last month of the pregnancy if you will be nursing the baby.  You may get a dark line from your belly button to the pubic area called the linea nigra.  You may develop rosy cheeks because of increase blood flow to the face.  You may develop spider looking lines of the face, neck, arms and chest. These go away after the baby is born. HOME CARE INSTRUCTIONS   It is extremely important to avoid all smoking, herbs, alcohol, and unprescribed drugs during your pregnancy. These chemicals affect the formation and growth of the baby. Avoid these chemicals throughout the pregnancy to ensure the delivery of a healthy infant.  Most of your home care instructions are the same as  suggested for the first trimester of your pregnancy. Keep your caregiver's appointments. Follow your caregiver's instructions regarding medication use, exercise and diet.  During pregnancy, you are providing food for you and your baby. Continue to eat regular, well-balanced meals. Choose foods such as meat, fish, milk and other low fat dairy products, vegetables, fruits, and whole-grain breads and cereals. Your caregiver will tell you of the ideal weight gain.  A physical sexual relationship may be continued up until near the end of pregnancy if there are no other problems. Problems could include early (premature) leaking of amniotic fluid from the membranes, vaginal bleeding, abdominal pain, or other medical or pregnancy problems.  Exercise regularly if there are no restrictions. Check with your caregiver if you are unsure of the safety of some of your exercises. The greatest weight gain will occur in the last 2 trimesters of pregnancy. Exercise will help you:  Control your weight.  Get you in shape for labor and delivery.  Lose weight   after you have the baby.  Wear a good support or jogging bra for breast tenderness during pregnancy. This may help if worn during sleep. Pads or tissues may be used in the bra if you are leaking colostrum.  Do not use hot tubs, steam rooms or saunas throughout the pregnancy.  Wear your seat belt at all times when driving. This protects you and your baby if you are in an accident.  Avoid raw meat, uncooked cheese, cat litter boxes and soil used by cats. These carry germs that can cause birth defects in the baby.  The second trimester is also a good time to visit your dentist for your dental health if this has not been done yet. Getting your teeth cleaned is OK. Use a soft toothbrush. Brush gently during pregnancy.  It is easier to loose urine during pregnancy. Tightening up and strengthening the pelvic muscles will help with this problem. Practice stopping your  urination while you are going to the bathroom. These are the same muscles you need to strengthen. It is also the muscles you would use as if you were trying to stop from passing gas. You can practice tightening these muscles up 10 times a set and repeating this about 3 times per day. Once you know what muscles to tighten up, do not perform these exercises during urination. It is more likely to contribute to an infection by backing up the urine.  Ask for help if you have financial, counseling or nutritional needs during pregnancy. Your caregiver will be able to offer counseling for these needs as well as refer you for other special needs.  Your skin may become oily. If so, wash your face with mild soap, use non-greasy moisturizer and oil or cream based makeup. MEDICATIONS AND DRUG USE IN PREGNANCY  Take prenatal vitamins as directed. The vitamin should contain 1 milligram of folic acid. Keep all vitamins out of reach of children. Only a couple vitamins or tablets containing iron may be fatal to a baby or young child when ingested.  Avoid use of all medications, including herbs, over-the-counter medications, not prescribed or suggested by your caregiver. Only take over-the-counter or prescription medicines for pain, discomfort, or fever as directed by your caregiver. Do not use aspirin.  Let your caregiver also know about herbs you may be using.  Alcohol is related to a number of birth defects. This includes fetal alcohol syndrome. All alcohol, in any form, should be avoided completely. Smoking will cause low birth rate and premature babies.  Street or illegal drugs are very harmful to the baby. They are absolutely forbidden. A baby born to an addicted mother will be addicted at birth. The baby will go through the same withdrawal an adult does. SEEK MEDICAL CARE IF:  You have any concerns or worries during your pregnancy. It is better to call with your questions if you feel they cannot wait, rather  than worry about them. SEEK IMMEDIATE MEDICAL CARE IF:   An unexplained oral temperature above 102 F (38.9 C) develops, or as your caregiver suggests.  You have leaking of fluid from the vagina (birth canal). If leaking membranes are suspected, take your temperature and tell your caregiver of this when you call.  There is vaginal spotting, bleeding, or passing clots. Tell your caregiver of the amount and how many pads are used. Light spotting in pregnancy is common, especially following intercourse.  You develop a bad smelling vaginal discharge with a change in the color from clear   to white.  You continue to feel sick to your stomach (nauseated) and have no relief from remedies suggested. You vomit blood or coffee ground-like materials.  You lose more than 2 pounds of weight or gain more than 2 pounds of weight over 1 week, or as suggested by your caregiver.  You notice swelling of your face, hands, feet, or legs.  You get exposed to German measles and have never had them.  You are exposed to fifth disease or chickenpox.  You develop belly (abdominal) pain. Round ligament discomfort is a common non-cancerous (benign) cause of abdominal pain in pregnancy. Your caregiver still must evaluate you.  You develop a bad headache that does not go away.  You develop fever, diarrhea, pain with urination, or shortness of breath.  You develop visual problems, blurry, or double vision.  You fall or are in a car accident or any kind of trauma.  There is mental or physical violence at home. Document Released: 09/18/2001 Document Revised: 12/17/2011 Document Reviewed: 03/23/2009 [employer] Patient Information 2013 [employer].  

## 2012-09-03 NOTE — Progress Notes (Signed)
Pulse: 91

## 2012-09-03 NOTE — Progress Notes (Signed)
No other complaints or concerns.  Obstetric precautions reviewed. Desires first trimester screening, will be ordered.

## 2012-09-12 ENCOUNTER — Ambulatory Visit (HOSPITAL_COMMUNITY)
Admission: RE | Admit: 2012-09-12 | Discharge: 2012-09-12 | Disposition: A | Discharge status: Home / Self Care | Payer: Medicaid Other | Source: Ambulatory Visit | Attending: Obstetrics & Gynecology | Admitting: Obstetrics & Gynecology

## 2012-09-12 ENCOUNTER — Other Ambulatory Visit: Payer: Self-pay

## 2012-09-12 ENCOUNTER — Encounter: Payer: Self-pay | Admitting: Obstetrics & Gynecology

## 2012-09-12 DIAGNOSIS — O3510X0 Maternal care for (suspected) chromosomal abnormality in fetus, unspecified, not applicable or unspecified: Secondary | ICD-10-CM | POA: Insufficient documentation

## 2012-09-12 DIAGNOSIS — O351XX0 Maternal care for (suspected) chromosomal abnormality in fetus, not applicable or unspecified: Secondary | ICD-10-CM | POA: Insufficient documentation

## 2012-09-12 DIAGNOSIS — Z3682 Encounter for antenatal screening for nuchal translucency: Secondary | ICD-10-CM

## 2012-09-12 DIAGNOSIS — O99210 Obesity complicating pregnancy, unspecified trimester: Secondary | ICD-10-CM | POA: Insufficient documentation

## 2012-09-12 DIAGNOSIS — E669 Obesity, unspecified: Secondary | ICD-10-CM | POA: Insufficient documentation

## 2012-09-12 DIAGNOSIS — Z3689 Encounter for other specified antenatal screening: Secondary | ICD-10-CM | POA: Insufficient documentation

## 2012-09-12 NOTE — Progress Notes (Signed)
Cathy Webster was seen for ultrasound appointment today.  Please see [facility] report for details.

## 2012-09-24 ENCOUNTER — Ambulatory Visit (INDEPENDENT_AMBULATORY_CARE_PROVIDER_SITE_OTHER): Payer: Medicaid Other | Admitting: Obstetrics & Gynecology

## 2012-09-24 ENCOUNTER — Encounter: Payer: Self-pay | Admitting: Obstetrics & Gynecology

## 2012-09-24 VITALS — BP 140/73 | Temp 97.2°F | Wt 262.4 lb

## 2012-09-24 DIAGNOSIS — O34219 Maternal care for unspecified type scar from previous cesarean delivery: Secondary | ICD-10-CM | POA: Insufficient documentation

## 2012-09-24 LAB — POCT URINALYSIS DIP (DEVICE)
Bilirubin Urine: NEGATIVE
Glucose, UA: NEGATIVE mg/dL
Hgb urine dipstick: NEGATIVE
Specific Gravity, Urine: 1.025 (ref 1.005–1.030)

## 2012-09-24 NOTE — Patient Instructions (Addendum)
Pregnancy - Second Trimester The second trimester of pregnancy (3 to 6 months) is a period of rapid growth for you and your baby. At the end of the sixth month, your baby is about 9 inches long and weighs 1 1/2 pounds. You will begin to feel the baby move between 18 and 20 weeks of the pregnancy. This is called quickening. Weight gain is faster. A clear fluid (colostrum) may leak out of your breasts. You may feel small contractions of the womb (uterus). This is known as false labor or Braxton-Hicks contractions. This is like a practice for labor when the baby is ready to be born. Usually, the problems with morning sickness have usually passed by the end of your first trimester. Some women develop small dark blotches (called cholasma, mask of pregnancy) on their face that usually goes away after the baby is born. Exposure to the sun makes the blotches worse. Acne may also develop in some pregnant women and pregnant women who have acne, may find that it goes away. PRENATAL EXAMS  Blood work may continue to be done during prenatal exams. These tests are done to check on your health and the probable health of your baby. Blood work is used to follow your blood levels (hemoglobin). Anemia (low hemoglobin) is common during pregnancy. Iron and vitamins are given to help prevent this. You will also be checked for diabetes between 24 and 28 weeks of the pregnancy. Some of the previous blood tests may be repeated.  The size of the uterus is measured during each visit. This is to make sure that the baby is continuing to grow properly according to the dates of the pregnancy.  Your blood pressure is checked every prenatal visit. This is to make sure you are not getting toxemia.  Your urine is checked to make sure you do not have an infection, diabetes or protein in the urine.  Your weight is checked often to make sure gains are happening at the suggested rate. This is to ensure that both you and your baby are growing  normally.  Sometimes, an ultrasound is performed to confirm the proper growth and development of the baby. This is a test which bounces harmless sound waves off the baby so your caregiver can more accurately determine due dates. Sometimes, a specialized test is done on the amniotic fluid surrounding the baby. This test is called an amniocentesis. The amniotic fluid is obtained by sticking a needle into the belly (abdomen). This is done to check the chromosomes in instances where there is a concern about possible genetic problems with the baby. It is also sometimes done near the end of pregnancy if an early delivery is required. In this case, it is done to help make sure the baby's lungs are mature enough for the baby to live outside of the womb. CHANGES OCCURING IN THE SECOND TRIMESTER OF PREGNANCY Your body goes through many changes during pregnancy. They vary from person to person. Talk to your caregiver about changes you notice that you are concerned about.  During the second trimester, you will likely have an increase in your appetite. It is normal to have cravings for certain foods. This varies from person to person and pregnancy to pregnancy.  Your lower abdomen will begin to bulge.  You may have to urinate more often because the uterus and baby are pressing on your bladder. It is also common to get more bladder infections during pregnancy (pain with urination). You can help this by   drinking lots of fluids and emptying your bladder before and after intercourse.  You may begin to get stretch marks on your hips, abdomen, and breasts. These are normal changes in the body during pregnancy. There are no exercises or medications to take that prevent this change.  You may begin to develop swollen and bulging veins (varicose veins) in your legs. Wearing support hose, elevating your feet for 15 minutes, 3 to 4 times a day and limiting salt in your diet helps lessen the problem.  Heartburn may develop  as the uterus grows and pushes up against the stomach. Antacids recommended by your caregiver helps with this problem. Also, eating smaller meals 4 to 5 times a day helps.  Constipation can be treated with a stool softener or adding bulk to your diet. Drinking lots of fluids, vegetables, fruits, and whole grains are helpful.  Exercising is also helpful. If you have been very active up until your pregnancy, most of these activities can be continued during your pregnancy. If you have been less active, it is helpful to start an exercise program such as walking.  Hemorrhoids (varicose veins in the rectum) may develop at the end of the second trimester. Warm sitz baths and hemorrhoid cream recommended by your caregiver helps hemorrhoid problems.  Backaches may develop during this time of your pregnancy. Avoid heavy lifting, wear low heal shoes and practice good posture to help with backache problems.  Some pregnant women develop tingling and numbness of their hand and fingers because of swelling and tightening of ligaments in the wrist (carpel tunnel syndrome). This goes away after the baby is born.  As your breasts enlarge, you may have to get a bigger bra. Get a comfortable, cotton, support bra. Do not get a nursing bra until the last month of the pregnancy if you will be nursing the baby.  You may get a dark line from your belly button to the pubic area called the linea nigra.  You may develop rosy cheeks because of increase blood flow to the face.  You may develop spider looking lines of the face, neck, arms and chest. These go away after the baby is born. HOME CARE INSTRUCTIONS   It is extremely important to avoid all smoking, herbs, alcohol, and unprescribed drugs during your pregnancy. These chemicals affect the formation and growth of the baby. Avoid these chemicals throughout the pregnancy to ensure the delivery of a healthy infant.  Most of your home care instructions are the same as  suggested for the first trimester of your pregnancy. Keep your caregiver's appointments. Follow your caregiver's instructions regarding medication use, exercise and diet.  During pregnancy, you are providing food for you and your baby. Continue to eat regular, well-balanced meals. Choose foods such as meat, fish, milk and other low fat dairy products, vegetables, fruits, and whole-grain breads and cereals. Your caregiver will tell you of the ideal weight gain.  A physical sexual relationship may be continued up until near the end of pregnancy if there are no other problems. Problems could include early (premature) leaking of amniotic fluid from the membranes, vaginal bleeding, abdominal pain, or other medical or pregnancy problems.  Exercise regularly if there are no restrictions. Check with your caregiver if you are unsure of the safety of some of your exercises. The greatest weight gain will occur in the last 2 trimesters of pregnancy. Exercise will help you:  Control your weight.  Get you in shape for labor and delivery.  Lose weight   after you have the baby.  Wear a good support or jogging bra for breast tenderness during pregnancy. This may help if worn during sleep. Pads or tissues may be used in the bra if you are leaking colostrum.  Do not use hot tubs, steam rooms or saunas throughout the pregnancy.  Wear your seat belt at all times when driving. This protects you and your baby if you are in an accident.  Avoid raw meat, uncooked cheese, cat litter boxes and soil used by cats. These carry germs that can cause birth defects in the baby.  The second trimester is also a good time to visit your dentist for your dental health if this has not been done yet. Getting your teeth cleaned is OK. Use a soft toothbrush. Brush gently during pregnancy.  It is easier to loose urine during pregnancy. Tightening up and strengthening the pelvic muscles will help with this problem. Practice stopping your  urination while you are going to the bathroom. These are the same muscles you need to strengthen. It is also the muscles you would use as if you were trying to stop from passing gas. You can practice tightening these muscles up 10 times a set and repeating this about 3 times per day. Once you know what muscles to tighten up, do not perform these exercises during urination. It is more likely to contribute to an infection by backing up the urine.  Ask for help if you have financial, counseling or nutritional needs during pregnancy. Your caregiver will be able to offer counseling for these needs as well as refer you for other special needs.  Your skin may become oily. If so, wash your face with mild soap, use non-greasy moisturizer and oil or cream based makeup. MEDICATIONS AND DRUG USE IN PREGNANCY  Take prenatal vitamins as directed. The vitamin should contain 1 milligram of folic acid. Keep all vitamins out of reach of children. Only a couple vitamins or tablets containing iron may be fatal to a baby or young child when ingested.  Avoid use of all medications, including herbs, over-the-counter medications, not prescribed or suggested by your caregiver. Only take over-the-counter or prescription medicines for pain, discomfort, or fever as directed by your caregiver. Do not use aspirin.  Let your caregiver also know about herbs you may be using.  Alcohol is related to a number of birth defects. This includes fetal alcohol syndrome. All alcohol, in any form, should be avoided completely. Smoking will cause low birth rate and premature babies.  Street or illegal drugs are very harmful to the baby. They are absolutely forbidden. A baby born to an addicted mother will be addicted at birth. The baby will go through the same withdrawal an adult does. SEEK MEDICAL CARE IF:  You have any concerns or worries during your pregnancy. It is better to call with your questions if you feel they cannot wait, rather  than worry about them. SEEK IMMEDIATE MEDICAL CARE IF:   An unexplained oral temperature above 102 F (38.9 C) develops, or as your caregiver suggests.  You have leaking of fluid from the vagina (birth canal). If leaking membranes are suspected, take your temperature and tell your caregiver of this when you call.  There is vaginal spotting, bleeding, or passing clots. Tell your caregiver of the amount and how many pads are used. Light spotting in pregnancy is common, especially following intercourse.  You develop a bad smelling vaginal discharge with a change in the color from clear   to white.  You continue to feel sick to your stomach (nauseated) and have no relief from remedies suggested. You vomit blood or coffee ground-like materials.  You lose more than 2 pounds of weight or gain more than 2 pounds of weight over 1 week, or as suggested by your caregiver.  You notice swelling of your face, hands, feet, or legs.  You get exposed to German measles and have never had them.  You are exposed to fifth disease or chickenpox.  You develop belly (abdominal) pain. Round ligament discomfort is a common non-cancerous (benign) cause of abdominal pain in pregnancy. Your caregiver still must evaluate you.  You develop a bad headache that does not go away.  You develop fever, diarrhea, pain with urination, or shortness of breath.  You develop visual problems, blurry, or double vision.  You fall or are in a car accident or any kind of trauma.  There is mental or physical violence at home. Document Released: 09/18/2001 Document Revised: 12/17/2011 Document Reviewed: 03/23/2009 Exit[employer] Patient Information 2013 [employer].   Vaginal Birth After Cesarean Delivery Vaginal birth after Cesarean delivery (VBAC) is giving birth vaginally after previously delivering a baby by a cesarean. In the past, if a woman had a Cesarean delivery, all births afterwards would be done by Cesarean delivery.  This is no longer true. It can be safe for the mother to try a vaginal delivery after having a Cesarean. The final decision to have a VBAC or repeat Cesarean delivery should be between the patient and her caregiver. The risks and benefits can be discussed relative to the reason for, and the type of the previous Cesarean delivery. WOMEN WHO PLAN TO HAVE A VBAC SHOULD CHECK WITH THEIR DOCTOR TO BE SURE THAT:  The previous Cesarean was done with a low transverse uterine incision (not a vertical classical incision).  The birth canal is big enough for the baby.  There were no other operations on the uterus.  They will have an electronic fetal monitor (EFM) on at all times during labor.  An operating room would be available and ready in case an emergency Cesarean is needed.  A doctor and surgical nursing staff would be available at all times during labor to be ready to do an emergency Cesarean if necessary.  An anesthesiologist would be present in case an emergency Cesarean is needed.  The nursery is prepared and has adequate personnel and necessary equipment available to care for the baby in case of an emergency Cesarean. BENEFITS OF VBAC:  Shorter stay in the hospital.  Lower delivery, nursery and hospital costs.  Less blood loss and need for blood transfusions.  Less fever and discomfort from major surgery.  Lower risk of blood clots.  Lower risk of infection.  Shorter recovery after going home.  Lower risk of other surgical complications, such as opening of the incision or hernia in the incision.  Decreased risk of injury to other organs.  Decreased risk for having to remove the uterus (hysterectomy).  Decreased risk for the placenta to completely or partially cover the opening of the uterus (placenta previa) with a future pregnancy.  Ability to have a larger family if desired. RISKS OF A VBAC:  Rupture of the uterus.  Having to remove the uterus (hysterectomy) if it  ruptures.  All the complications of major surgery and/or injury to other organs.  Excessive bleeding, blood clots and infection.  Lower Apgar scores (method to evaluate the newborn based on appearance, pulse, grimace,   activity, and respiration) and more risks to the baby.  There is a higher risk of uterine rupture if you induce or augment labor.  There is a higher risk of uterine rupture if you use medications to ripen the cervix. VBAC SHOULD NOT BE DONE IF:  The previous Cesarean was done with a vertical (classical) or T-shaped incision, or you do not know what kind of an incision was made.  You had a ruptured uterus.  You had surgery on your uterus.  You have medical or obstetrical problems.  There are problems with the baby.  There were two previous Cesarean deliveries and no vaginal deliveries. OTHER FACTS TO KNOW ABOUT VBAC:  It is safe to have an epidural anesthetic with VBAC.  It is safe to turn the baby from a breech position (attempt an external cephalic version).  It is safe to try a VBAC with twins.  Pregnancies later than 40 weeks have not been successful with VBAC.  There is an increased failure rate of a VBAC in obese pregnant women.  There is an increased failure rate with VABC if the baby weighs 8.8 pounds (4000 grams) or more.  There is an increased failure rate if the time between the Cesarean and VBAC is less than 19 months.  There is an increased failure rate if pre-eclampsia is present (high blood pressure, protein in the urine and swelling of face and extremities).  VBAC is very successful if there was a previous vaginal birth.  VBAC is very successful when the labor starts spontaneously before the due date.  Delivery of VBAC is similar to having a normal spontaneous vaginal delivery. It is important to discuss VBAC with your caregiver early in the pregnancy so you can understand the risks, benefits and options. It will give you time to decide what  is best in your particular case relevant to the reason for your previous Cesarean delivery. It should be understood that medical changes in the mother or pregnancy may occur during the pregnancy, which make it necessary to change you or your caregiver's initial decision. The counseling, concerns and decisions should be documented in the medical record and signed by all parties. Document Released: 03/17/2007 Document Revised: 12/17/2011 Document Reviewed: 11/05/2008 [employer] Patient Information 2013 [employer].  

## 2012-09-24 NOTE — Progress Notes (Signed)
Pulse 93 Patient reports some cramping and upper abdominal pain that makes it difficult to eat sometimes

## 2012-09-24 NOTE — Progress Notes (Signed)
Pt with no complaints.  Pt desires TOLAC.  Papers signed.  Encouraged po intake.  F/u 4 weeks Quad screen next visit.

## 2012-09-26 ENCOUNTER — Emergency Department (HOSPITAL_BASED_OUTPATIENT_CLINIC_OR_DEPARTMENT_OTHER)
Admission: EM | Admit: 2012-09-26 | Discharge: 2012-09-26 | Disposition: A | Discharge status: Home / Self Care | Payer: Medicaid Other | Attending: Emergency Medicine | Admitting: Emergency Medicine

## 2012-09-26 ENCOUNTER — Encounter (HOSPITAL_BASED_OUTPATIENT_CLINIC_OR_DEPARTMENT_OTHER): Payer: Self-pay | Admitting: Emergency Medicine

## 2012-09-26 DIAGNOSIS — R1013 Epigastric pain: Secondary | ICD-10-CM | POA: Insufficient documentation

## 2012-09-26 DIAGNOSIS — K2970 Gastritis, unspecified, without bleeding: Secondary | ICD-10-CM | POA: Insufficient documentation

## 2012-09-26 DIAGNOSIS — R1110 Vomiting, unspecified: Secondary | ICD-10-CM

## 2012-09-26 DIAGNOSIS — O219 Vomiting of pregnancy, unspecified: Secondary | ICD-10-CM | POA: Insufficient documentation

## 2012-09-26 DIAGNOSIS — R110 Nausea: Secondary | ICD-10-CM | POA: Insufficient documentation

## 2012-09-26 DIAGNOSIS — R197 Diarrhea, unspecified: Secondary | ICD-10-CM | POA: Insufficient documentation

## 2012-09-26 DIAGNOSIS — O9989 Other specified diseases and conditions complicating pregnancy, childbirth and the puerperium: Secondary | ICD-10-CM | POA: Insufficient documentation

## 2012-09-26 DIAGNOSIS — R630 Anorexia: Secondary | ICD-10-CM | POA: Insufficient documentation

## 2012-09-26 LAB — URINE MICROSCOPIC-ADD ON

## 2012-09-26 LAB — URINALYSIS, ROUTINE W REFLEX MICROSCOPIC
Glucose, UA: NEGATIVE mg/dL
Hgb urine dipstick: NEGATIVE
Specific Gravity, Urine: 1.015 (ref 1.005–1.030)
pH: 7.5 (ref 5.0–8.0)

## 2012-09-26 MED ORDER — SUCRALFATE 1 GM/10ML PO SUSP: 1.0000 g | Freq: Four times a day (QID) | ORAL | Status: DC

## 2012-09-26 MED ORDER — RANITIDINE HCL 150 MG PO TABS: 150.0000 mg | ORAL_TABLET | Freq: Two times a day (BID) | ORAL | Status: DC

## 2012-09-26 MED ORDER — ONDANSETRON 4 MG PO TBDP
4.0000 mg | ORAL_TABLET | Freq: Once | ORAL | Status: AC
  Administered 2012-09-26: 4 mg via ORAL
  Filled 2012-09-26: qty 1

## 2012-09-26 NOTE — ED Notes (Signed)
MD in with patient. Performed US of fetus.

## 2012-09-26 NOTE — ED Notes (Signed)
Started 1.5 weeks ago with "sharp" abdominal pain, generalized. Pain relieved with change in position. Had diarrhea and vomiting yesterday. Pt is [redacted] weeks pregnant.

## 2012-09-26 NOTE — ED Provider Notes (Signed)
History     CSN: [id number]  Arrival date & time 09/26/12  1028   First MD Initiated Contact with Patient 09/26/12 1054      No chief complaint on file.   (Consider location/radiation/quality/duration/timing/severity/associated sxs/prior treatment) Patient is a 21 y.o. female presenting with cramps. The history is provided by the patient.  Abdominal Cramping The primary symptoms of the illness include abdominal pain, nausea, vomiting and diarrhea. The primary symptoms of the illness do not include fever, shortness of breath, hematemesis, dysuria, vaginal discharge or vaginal bleeding. Episode onset: Abdominal pain started several weeks ago but vomiting and diarrhea who was yesterday. The onset of the illness was sudden. The problem has not changed since onset. Onset: 2 weeks ago. The abdominal pain is located in the epigastric region. The abdominal pain does not radiate. The severity of the abdominal pain is 6/10. The abdominal pain is relieved by nothing. The abdominal pain is exacerbated by eating.  The vomiting began yesterday. Vomiting occurs 2 to 5 times per day. The emesis contains stomach contents.  The diarrhea began yesterday. The diarrhea is watery. The diarrhea occurs 2 to 4 times per day.  The patient states that she believes she is currently pregnant. Additional symptoms associated with the illness include anorexia. Symptoms associated with the illness do not include chills, constipation, urgency, frequency or back pain.    Past Medical History  Diagnosis Date  . Abnormal Pap smear 2008    Past Surgical History  Procedure Date  . Cesarean section   . Tonsillectomy     Family History  Problem Relation Age of Onset  . Diabetes Mother   . Asthma Brother     History  Substance Use Topics  . Smoking status: Never Smoker   . Smokeless tobacco: Never Used  . Alcohol Use: No    OB History    Grav Para Term Preterm Abortions TAB SAB Ect Mult Living   2 1 1       1        Review of Systems  Constitutional: Negative for fever and chills.  Respiratory: Negative for shortness of breath.   Gastrointestinal: Positive for nausea, vomiting, abdominal pain, diarrhea and anorexia. Negative for constipation and hematemesis.  Genitourinary: Negative for dysuria, urgency, frequency, vaginal bleeding and vaginal discharge.  Musculoskeletal: Negative for back pain.  All other systems reviewed and are negative.    Allergies  Review of patient's allergies indicates no known allergies.  Home Medications   Current Outpatient Rx  Name  Route  Sig  Dispense  Refill  . ONDANSETRON HCL 4 MG PO TABS   Oral   Take 1 tablet (4 mg total) by mouth every 8 (eight) hours as needed for nausea.   20 tablet   0   . CONCEPT OB 130-92.4-1 MG PO CAPS   Oral   Take 1 tablet by mouth 1 day or 1 dose.   30 capsule   5     BP 118/64  Pulse 90  Temp 99.1 F (37.3 C) (Oral)  Resp 18  SpO2 100%  LMP 06/16/2012  Physical Exam  Nursing note and vitals reviewed. Constitutional: She is oriented to person, place, and time. She appears well-developed and well-nourished. No distress.  HENT:  Head: Normocephalic and atraumatic.  Mouth/Throat: Oropharynx is clear and moist.  Eyes: Conjunctivae normal and EOM are normal. Pupils are equal, round, and reactive to light.  Neck: Normal range of motion. Neck supple.  Cardiovascular: Normal rate, regular  rhythm and intact distal pulses.   No murmur heard. Pulmonary/Chest: Effort normal and breath sounds normal. No respiratory distress. She has no wheezes. She has no rales.  Abdominal: Soft. Normal appearance. She exhibits no distension. There is tenderness in the epigastric area. There is no rebound, no guarding and no CVA tenderness.  Musculoskeletal: Normal range of motion. She exhibits no edema and no tenderness.  Neurological: She is alert and oriented to person, place, and time.  Skin: Skin is warm and dry. No rash noted. No  erythema.  Psychiatric: She has a normal mood and affect. Her behavior is normal.    ED Course  Procedures (including critical care time)  Labs Reviewed  URINALYSIS, ROUTINE W REFLEX MICROSCOPIC - Abnormal; Notable for the following:    Ketones, ur 15 (*)     Leukocytes, UA TRACE (*)     All other components within normal limits  URINE MICROSCOPIC-ADD ON - Abnormal; Notable for the following:    Squamous Epithelial / LPF FEW (*)     All other components within normal limits   No results found.  EMERGENCY DEPARTMENT [place] PREGNANCY "Study: Limited Ultrasound of the Pelvis"  INDICATIONS:Pregnancy(required) Multiple views of the uterus and pelvic cavity are obtained with a multi-frequency probe.  APPROACH:Transabdominal   PERFORMED BY: Myself  IMAGES ARCHIVED?: Yes  LIMITATIONS: Body habitus  PREGNANCY FREE FLUID: None  PREGNANCY UTERUS FINDINGS:Uterus enlarged ADNEXAL FINDINGS:Left ovary not seen and Right ovary not seen  PREGNANCY FINDINGS: Fetal heart activity seen  INTERPRETATION: Viable intrauterine pregnancy  GESTATIONAL AGE, ESTIMATE: 15 w  FETAL HEART RATE: 143  COMMENT(Estimate of Gestational Age):  Fetal movement present    No diagnosis found.    MDM   Patient now [redacted] weeks pregnant with an IUP. She saw her OB/GYN 2 days ago and at that time was told she was dehydrated and need to increase her fluids. She denies receiving any antibiotics at that time. Today she presents do to 4 episodes of vomiting yesterday and several episodes of diarrhea. She claims epigastric tenderness but denies any lower abdominal pain or cramping. She denies any vaginal discharge or dysuria. On UA done 2 days ago her PCPs office and showed Trichomonas but she denied any medication for that. Her urine today shows improvement in the key tones and no longer any Trichomonas. Feel her symptoms are mostly due to gastritis and possibly viral syndrome. These were discussed with the patient.  She was given Zantac and Carafate for the epigastric tenderness which has been ongoing for 2 weeks. She will followup with her OB/GYN.        Gwyneth Sprout, MD 09/26/12 1134

## 2012-10-08 NOTE — L&D Delivery Note (Signed)
Delivery Note At 11:06 PM a viable female was delivered via VBAC, Spontaneous (Presentation: Left Occiput Anterior).  APGAR: 9, 9; weight 6 lb 10.9 oz (3030 g).   Placenta status: Intact, Spontaneous.  Cord: 3 vessels with the following complications: None.   Anesthesia: Epidural  Episiotomy: None Lacerations: Vaginal;Labial;2nd degree Suture Repair: 3.0 vicryl Est. Blood Loss (mL): 350  Mom to postpartum.  Baby to nursery-stable.  [facility] 03/25/2013, 12:54 AM

## 2012-10-08 NOTE — L&D Delivery Note (Signed)
I have seen and examined this patient and I agree with the above. Dr Shawnie Pons called to eval lac repair as it was still oozing; she placed two more continuous throws and hemostasis was achieved. Cam Hai 1:52 AM 03/25/2013

## 2012-10-22 ENCOUNTER — Ambulatory Visit (INDEPENDENT_AMBULATORY_CARE_PROVIDER_SITE_OTHER): Payer: Medicaid Other | Admitting: Family Medicine

## 2012-10-22 ENCOUNTER — Encounter: Payer: Self-pay | Admitting: *Deleted

## 2012-10-22 VITALS — BP 144/78 | Temp 96.9°F | Wt 267.6 lb

## 2012-10-22 DIAGNOSIS — K5900 Constipation, unspecified: Secondary | ICD-10-CM

## 2012-10-22 DIAGNOSIS — Z3480 Encounter for supervision of other normal pregnancy, unspecified trimester: Secondary | ICD-10-CM

## 2012-10-22 DIAGNOSIS — O169 Unspecified maternal hypertension, unspecified trimester: Secondary | ICD-10-CM

## 2012-10-22 DIAGNOSIS — O34219 Maternal care for unspecified type scar from previous cesarean delivery: Secondary | ICD-10-CM

## 2012-10-22 LAB — COMPREHENSIVE METABOLIC PANEL
Alkaline Phosphatase: 43 U/L (ref 39–117)
Glucose, Bld: 68 mg/dL — ABNORMAL LOW (ref 70–99)
Sodium: 138 mEq/L (ref 135–145)
Total Bilirubin: 0.3 mg/dL (ref 0.3–1.2)
Total Protein: 6.3 g/dL (ref 6.0–8.3)

## 2012-10-22 LAB — POCT URINALYSIS DIP (DEVICE)
Protein, ur: NEGATIVE mg/dL
Specific Gravity, Urine: 1.02 (ref 1.005–1.030)
Urobilinogen, UA: 0.2 mg/dL (ref 0.0–1.0)

## 2012-10-22 MED ORDER — DOCUSATE SODIUM 100 MG PO CAPS: 100.0000 mg | ORAL_CAPSULE | Freq: Two times a day (BID) | ORAL | Status: DC | PRN

## 2012-10-22 NOTE — Patient Instructions (Addendum)
Pregnancy - Third Trimester The third trimester begins at the 28th week of pregnancy and ends at birth. It is important to follow your doctor's instructions. HOME CARE   Go to your doctor's visits.  Do not smoke.  Do not drink alcohol or use drugs.  Only take medicine as told by your doctor.  Take prenatal vitamins as told. The vitamin should contain 1 milligram of folic acid.  Exercise.  Eat healthy foods. Eat regular, well-balanced meals.  You can have sex (intercourse) if there are no other problems with the pregnancy.  Do not use hot tubs, steam rooms, or saunas.  Wear a seat belt while driving.  Avoid raw meat, uncooked cheese, and litter boxes and soil used by cats.  Rest with your legs raised (elevated).  Make a list of emergency phone numbers. Keep this list with you.  Arrange for help when you come back home after delivering the baby.  Make a trial run to the hospital.  Take prenatal classes.  Prepare the baby's nursery.  Do not travel out of the city. If you absolutely have to, get permission from your doctor first.  Wear flat shoes. Do not wear high heels. GET HELP RIGHT AWAY IF:   You have a temperature by mouth above 102 F (38.9 C), not controlled by medicine.  You have not felt the baby move for more than 1 hour. If you think the baby is not moving as much as normal, eat something with sugar in it or lie down on your left side for an hour. The baby should move at least 4 to 5 times per hour.  Fluid is coming from the vagina.  Blood is coming from the vagina. Light spotting is common, especially after sex (intercourse).  You have belly (abdominal) pain.  You have a bad smelling fluid (discharge) coming from the vagina. The fluid changes from clear to white.  You still feel sick to your stomach (nauseous).  You throw up (vomit) for more than 24 hours.  You have the chills.  You have shortness of breath.  You have a burning feeling when you  pee (urinate).  You lose or gain more than 2 pounds (0.9 kilograms) of weight over a week, or as told by your doctor.  Your face, hands, feet, or legs get puffy (swell).  You have a bad headache that will not go away.  You start to have problems seeing (blurry or double vision).  You fall, are in a car accident, or have any kind of trauma.  There is mental or physical violence at home.  You have any concerns or worries during your pregnancy. MAKE SURE YOU:   Understand these instructions.  Will watch your condition.  Will get help right away if you are not doing well or get worse. Document Released: 12/19/2009 Document Revised: 12/17/2011 Document Reviewed: 12/19/2009 [facility] Patient Information 2013 [place], [place].  Constipation, Adult Constipation is when a person has fewer than 3 bowel movements a week; has difficulty having a bowel movement; or has stools that are dry, hard, or larger than normal. As people grow older, constipation is more common. If you try to fix constipation with medicines that make you have a bowel movement (laxatives), the problem may get worse. Long-term laxative use may cause the muscles of the colon to become weak. A low-fiber diet, not taking in enough fluids, and taking certain medicines may make constipation worse. CAUSES   Certain medicines, such as antidepressants, pain medicine, iron  supplements, antacids, and water pills.   Certain diseases, such as diabetes, irritable bowel syndrome (IBS), thyroid disease, or depression.   Not drinking enough water.   Not eating enough fiber-rich foods.   Stress or travel.  Lack of physical activity or exercise.  Not going to the restroom when there is the urge to have a bowel movement.  Ignoring the urge to have a bowel movement.  Using laxatives too much. SYMPTOMS   Having fewer than 3 bowel movements a week.   Straining to have a bowel movement.   Having hard, dry, or larger than  normal stools.   Feeling full or bloated.   Pain in the lower abdomen.  Not feeling relief after having a bowel movement. DIAGNOSIS  Your caregiver will take a medical history and perform a physical exam. Further testing may be done for severe constipation. Some tests may include:   A barium enema X-ray to examine your rectum, colon, and sometimes, your small intestine.  A sigmoidoscopy to examine your lower colon.  A colonoscopy to examine your entire colon. TREATMENT  Treatment will depend on the severity of your constipation and what is causing it. Some dietary treatments include drinking more fluids and eating more fiber-rich foods. Lifestyle treatments may include regular exercise. If these diet and lifestyle recommendations do not help, your caregiver may recommend taking over-the-counter laxative medicines to help you have bowel movements. Prescription medicines may be prescribed if over-the-counter medicines do not work.  HOME CARE INSTRUCTIONS   Increase dietary fiber in your diet, such as fruits, vegetables, whole grains, and beans. Limit high-fat and processed sugars in your diet, such as [place] fries, hamburgers, cookies, candies, and soda.   A fiber supplement may be added to your diet if you cannot get enough fiber from foods.   Drink enough fluids to keep your urine clear or pale yellow.   Exercise regularly or as directed by your caregiver.   Go to the restroom when you have the urge to go. Do not hold it.  Only take medicines as directed by your caregiver. Do not take other medicines for constipation without talking to your caregiver first. SEEK IMMEDIATE MEDICAL CARE IF:   You have bright red blood in your stool.   Your constipation lasts for more than 4 days or gets worse.   You have abdominal or rectal pain.   You have thin, pencil-like stools.  You have unexplained weight loss. MAKE SURE YOU:   Understand these instructions.  Will watch your  condition.  Will get help right away if you are not doing well or get worse. Document Released: 06/22/2004 Document Revised: 12/17/2011 Document Reviewed: 08/28/2011 [facility] Patient Information 2013 [place], [place].

## 2012-10-22 NOTE — Progress Notes (Signed)
Round ligament pain. No discharge, bleeding or dysuria. Constipation. Elevated BP for second time this pregnancy. Had GHTN last pregnancy (pt says she did not have preeclampsia). Does not take medication for HTN and does not have dx of HTN between pregnancies. Will get CMP and baseline 24 hour urine. Possible transfer to [facility].  Hx c-section for FTP (dilated to 6). Desires TOLAC. Will get records from [facility] and Dr. Tawni Levy office. Ultrasound for fetal anatomy in next few weeks. First trimester genetic screen and NT negetive. AFP drawn today.

## 2012-10-22 NOTE — Progress Notes (Signed)
Pulse: 108

## 2012-10-27 ENCOUNTER — Ambulatory Visit (HOSPITAL_COMMUNITY)
Admission: RE | Admit: 2012-10-27 | Discharge: 2012-10-27 | Disposition: A | Discharge status: Home / Self Care | Payer: Medicaid Other | Source: Ambulatory Visit | Attending: Family Medicine | Admitting: Family Medicine

## 2012-10-27 DIAGNOSIS — O34219 Maternal care for unspecified type scar from previous cesarean delivery: Secondary | ICD-10-CM

## 2012-10-27 DIAGNOSIS — O09299 Supervision of pregnancy with other poor reproductive or obstetric history, unspecified trimester: Secondary | ICD-10-CM | POA: Insufficient documentation

## 2012-10-27 DIAGNOSIS — O139 Gestational [pregnancy-induced] hypertension without significant proteinuria, unspecified trimester: Secondary | ICD-10-CM | POA: Insufficient documentation

## 2012-10-29 ENCOUNTER — Encounter: Payer: Self-pay | Admitting: Family Medicine

## 2012-11-05 ENCOUNTER — Encounter: Payer: Self-pay | Admitting: *Deleted

## 2012-11-06 ENCOUNTER — Encounter: Payer: Self-pay | Admitting: Family Medicine

## 2012-11-06 ENCOUNTER — Emergency Department (HOSPITAL_BASED_OUTPATIENT_CLINIC_OR_DEPARTMENT_OTHER)
Admission: EM | Admit: 2012-11-06 | Discharge: 2012-11-06 | Disposition: A | Discharge status: Home / Self Care | Payer: Medicaid Other | Attending: Emergency Medicine | Admitting: Emergency Medicine

## 2012-11-06 ENCOUNTER — Encounter (HOSPITAL_BASED_OUTPATIENT_CLINIC_OR_DEPARTMENT_OTHER): Payer: Self-pay | Admitting: Emergency Medicine

## 2012-11-06 DIAGNOSIS — Z79899 Other long term (current) drug therapy: Secondary | ICD-10-CM | POA: Insufficient documentation

## 2012-11-06 DIAGNOSIS — R059 Cough, unspecified: Secondary | ICD-10-CM | POA: Insufficient documentation

## 2012-11-06 DIAGNOSIS — J069 Acute upper respiratory infection, unspecified: Secondary | ICD-10-CM | POA: Insufficient documentation

## 2012-11-06 DIAGNOSIS — Z8742 Personal history of other diseases of the female genital tract: Secondary | ICD-10-CM | POA: Insufficient documentation

## 2012-11-06 DIAGNOSIS — R05 Cough: Secondary | ICD-10-CM | POA: Insufficient documentation

## 2012-11-06 LAB — RAPID STREP SCREEN (MED CTR MEBANE ONLY): Streptococcus, Group A Screen (Direct): NEGATIVE

## 2012-11-06 MED ORDER — GUAIFENESIN 100 MG/5ML PO LIQD: 100.0000 mg | ORAL | Status: DC | PRN

## 2012-11-06 NOTE — ED Notes (Signed)
Body aches, nasal congestion started Monday, sore throat started tues, , pt reports that she loses voice on and off

## 2012-11-06 NOTE — ED Provider Notes (Signed)
History     CSN: [id number]  Arrival date & time 11/06/12  2009   First MD Initiated Contact with Patient 11/06/12 2025      Chief Complaint  Patient presents with  . Sore Throat    (Consider location/radiation/quality/duration/timing/severity/associated sxs/prior treatment) HPI Patient presents to the emergency department with cough and nasal congestion, sore throat for the last 4 days.  Patient, states that she's had some body aches, as well.  Patient denies chest pain, shortness of breath, nausea, vomiting, diarrhea, weakness, dizziness, headache, or syncope patient did not take any medications for her symptoms.  Patient is also 5 months pregnant.  Past Medical History  Diagnosis Date  . Abnormal Pap smear 2008    Past Surgical History  Procedure Date  . Cesarean section   . Tonsillectomy     Family History  Problem Relation Age of Onset  . Diabetes Mother   . Asthma Brother     History  Substance Use Topics  . Smoking status: Never Smoker   . Smokeless tobacco: Never Used  . Alcohol Use: No    OB History    Grav Para Term Preterm Abortions TAB SAB Ect Mult Living   2 1 1       1       Review of Systems All other systems negative except as documented in the HPI. All pertinent positives and negatives as reviewed in the HPI.  Allergies  Review of patient's allergies indicates no known allergies.  Home Medications   Current Outpatient Rx  Name  Route  Sig  Dispense  Refill  . CONCEPT OB 130-92.4-1 MG PO CAPS   Oral   Take 1 tablet by mouth 1 day or 1 dose.   30 capsule   5   . DOCUSATE SODIUM 100 MG PO CAPS   Oral   Take 1 capsule (100 mg total) by mouth 2 (two) times daily as needed for constipation.   30 capsule   2   . ONDANSETRON HCL 4 MG PO TABS   Oral   Take 1 tablet (4 mg total) by mouth every 8 (eight) hours as needed for nausea.   20 tablet   0   . RANITIDINE HCL 150 MG PO TABS   Oral   Take 1 tablet (150 mg total) by mouth 2 (two)  times daily.   28 tablet   0   . SUCRALFATE 1 GM/10ML PO SUSP   Oral   Take 10 mLs (1 g total) by mouth 4 (four) times daily.   420 mL   0     BP 148/64  Pulse 106  Temp 98.7 F (37.1 C) (Oral)  Resp 18  Ht 5\' 6"  (1.676 m)  Wt 263 lb (119.296 kg)  BMI 42.45 kg/m2  SpO2 100%  LMP 06/16/2012  Physical Exam  Nursing note and vitals reviewed. Constitutional: She is oriented to person, place, and time. She appears well-developed and well-nourished.  HENT:  Head: Normocephalic and atraumatic.  Eyes: Pupils are equal, round, and reactive to light.  Neck: Normal range of motion. Neck supple.  Cardiovascular: Normal rate, regular rhythm and normal heart sounds.  Exam reveals no gallop and no friction rub.   No murmur heard. Pulmonary/Chest: Effort normal and breath sounds normal.  Neurological: She is alert and oriented to person, place, and time.  Skin: Skin is warm and dry. No rash noted.    ED Course  Procedures (including critical care time)  The patient  retreated for viral URI symptoms.  She is told to return here as needed for any worsening in her condition. Increase your fluids.   MDM          Carlyle Dolly, PA-C 11/06/12 2139

## 2012-11-06 NOTE — ED Provider Notes (Signed)
Medical screening examination/treatment/procedure(s) were performed by non-physician practitioner and as supervising physician I was immediately available for consultation/collaboration.  Camauri Fleece J. Damarcus Reggio, MD 11/06/12 2243 

## 2012-11-07 ENCOUNTER — Telehealth: Payer: Self-pay

## 2012-11-07 NOTE — Telephone Encounter (Signed)
Pt called and only stated her name and DOB.  Called pt and pt informed me that she has already spoken to the on-call nurse and did not have any further questions.

## 2012-11-19 ENCOUNTER — Ambulatory Visit (INDEPENDENT_AMBULATORY_CARE_PROVIDER_SITE_OTHER): Payer: Medicaid Other | Admitting: Advanced Practice Midwife

## 2012-11-19 ENCOUNTER — Encounter: Payer: Self-pay | Admitting: Advanced Practice Midwife

## 2012-11-19 VITALS — BP 124/76 | Temp 97.8°F | Wt 265.5 lb

## 2012-11-19 DIAGNOSIS — O34219 Maternal care for unspecified type scar from previous cesarean delivery: Secondary | ICD-10-CM

## 2012-11-19 LAB — POCT URINALYSIS DIP (DEVICE)
Hgb urine dipstick: NEGATIVE
Ketones, ur: NEGATIVE mg/dL
Protein, ur: NEGATIVE mg/dL
Specific Gravity, Urine: 1.02 (ref 1.005–1.030)
pH: 8.5 — ABNORMAL HIGH (ref 5.0–8.0)

## 2012-11-19 NOTE — Progress Notes (Signed)
S: G2P1001 presents today for 4 week follow up. Doing well.  No complaints.  Good fetal movement. Denies vaginal bleeding, cramping, or loss of fluid.  No contractions.  O: See flowsheet.  A/P:  Patient doing well. Will get 24 hour urine protein for baseline (patient did not complete following last visit). Follow up in 4 weeks. Glucola at next visit.

## 2012-11-19 NOTE — Patient Instructions (Addendum)
Pregnancy - Second Trimester The second trimester of pregnancy (3 to 6 months) is a period of rapid growth for you and your baby. At the end of the sixth month, your baby is about 9 inches long and weighs 1 1/2 pounds. You will begin to feel the baby move between 18 and 20 weeks of the pregnancy. This is called quickening. Weight gain is faster. A clear fluid (colostrum) may leak out of your breasts. You may feel small contractions of the womb (uterus). This is known as false labor or Braxton-Hicks contractions. This is like a practice for labor when the baby is ready to be born. Usually, the problems with morning sickness have usually passed by the end of your first trimester. Some women develop small dark blotches (called cholasma, mask of pregnancy) on their face that usually goes away after the baby is born. Exposure to the sun makes the blotches worse. Acne may also develop in some pregnant women and pregnant women who have acne, may find that it goes away. PRENATAL EXAMS  Blood work may continue to be done during prenatal exams. These tests are done to check on your health and the probable health of your baby. Blood work is used to follow your blood levels (hemoglobin). Anemia (low hemoglobin) is common during pregnancy. Iron and vitamins are given to help prevent this. You will also be checked for diabetes between 24 and 28 weeks of the pregnancy. Some of the previous blood tests may be repeated.  The size of the uterus is measured during each visit. This is to make sure that the baby is continuing to grow properly according to the dates of the pregnancy.  Your blood pressure is checked every prenatal visit. This is to make sure you are not getting toxemia.  Your urine is checked to make sure you do not have an infection, diabetes or protein in the urine.  Your weight is checked often to make sure gains are happening at the suggested rate. This is to ensure that both you and your baby are growing  normally.  Sometimes, an ultrasound is performed to confirm the proper growth and development of the baby. This is a test which bounces harmless sound waves off the baby so your caregiver can more accurately determine due dates. Sometimes, a specialized test is done on the amniotic fluid surrounding the baby. This test is called an amniocentesis. The amniotic fluid is obtained by sticking a needle into the belly (abdomen). This is done to check the chromosomes in instances where there is a concern about possible genetic problems with the baby. It is also sometimes done near the end of pregnancy if an early delivery is required. In this case, it is done to help make sure the baby's lungs are mature enough for the baby to live outside of the womb. CHANGES OCCURING IN THE SECOND TRIMESTER OF PREGNANCY Your body goes through many changes during pregnancy. They vary from person to person. Talk to your caregiver about changes you notice that you are concerned about.  During the second trimester, you will likely have an increase in your appetite. It is normal to have cravings for certain foods. This varies from person to person and pregnancy to pregnancy.  Your lower abdomen will begin to bulge.  You may have to urinate more often because the uterus and baby are pressing on your bladder. It is also common to get more bladder infections during pregnancy (pain with urination). You can help this by   drinking lots of fluids and emptying your bladder before and after intercourse.  You may begin to get stretch marks on your hips, abdomen, and breasts. These are normal changes in the body during pregnancy. There are no exercises or medications to take that prevent this change.  You may begin to develop swollen and bulging veins (varicose veins) in your legs. Wearing support hose, elevating your feet for 15 minutes, 3 to 4 times a day and limiting salt in your diet helps lessen the problem.  Heartburn may develop  as the uterus grows and pushes up against the stomach. Antacids recommended by your caregiver helps with this problem. Also, eating smaller meals 4 to 5 times a day helps.  Constipation can be treated with a stool softener or adding bulk to your diet. Drinking lots of fluids, vegetables, fruits, and whole grains are helpful.  Exercising is also helpful. If you have been very active up until your pregnancy, most of these activities can be continued during your pregnancy. If you have been less active, it is helpful to start an exercise program such as walking.  Hemorrhoids (varicose veins in the rectum) may develop at the end of the second trimester. Warm sitz baths and hemorrhoid cream recommended by your caregiver helps hemorrhoid problems.  Backaches may develop during this time of your pregnancy. Avoid heavy lifting, wear low heal shoes and practice good posture to help with backache problems.  Some pregnant women develop tingling and numbness of their hand and fingers because of swelling and tightening of ligaments in the wrist (carpel tunnel syndrome). This goes away after the baby is born.  As your breasts enlarge, you may have to get a bigger bra. Get a comfortable, cotton, support bra. Do not get a nursing bra until the last month of the pregnancy if you will be nursing the baby.  You may get a dark line from your belly button to the pubic area called the linea nigra.  You may develop rosy cheeks because of increase blood flow to the face.  You may develop spider looking lines of the face, neck, arms and chest. These go away after the baby is born. HOME CARE INSTRUCTIONS   It is extremely important to avoid all smoking, herbs, alcohol, and unprescribed drugs during your pregnancy. These chemicals affect the formation and growth of the baby. Avoid these chemicals throughout the pregnancy to ensure the delivery of a healthy infant.  Most of your home care instructions are the same as  suggested for the first trimester of your pregnancy. Keep your caregiver's appointments. Follow your caregiver's instructions regarding medication use, exercise and diet.  During pregnancy, you are providing food for you and your baby. Continue to eat regular, well-balanced meals. Choose foods such as meat, fish, milk and other low fat dairy products, vegetables, fruits, and whole-grain breads and cereals. Your caregiver will tell you of the ideal weight gain.  A physical sexual relationship may be continued up until near the end of pregnancy if there are no other problems. Problems could include early (premature) leaking of amniotic fluid from the membranes, vaginal bleeding, abdominal pain, or other medical or pregnancy problems.  Exercise regularly if there are no restrictions. Check with your caregiver if you are unsure of the safety of some of your exercises. The greatest weight gain will occur in the last 2 trimesters of pregnancy. Exercise will help you:  Control your weight.  Get you in shape for labor and delivery.  Lose weight   after you have the baby.  Wear a good support or jogging bra for breast tenderness during pregnancy. This may help if worn during sleep. Pads or tissues may be used in the bra if you are leaking colostrum.  Do not use hot tubs, steam rooms or saunas throughout the pregnancy.  Wear your seat belt at all times when driving. This protects you and your baby if you are in an accident.  Avoid raw meat, uncooked cheese, cat litter boxes and soil used by cats. These carry germs that can cause birth defects in the baby.  The second trimester is also a good time to visit your dentist for your dental health if this has not been done yet. Getting your teeth cleaned is OK. Use a soft toothbrush. Brush gently during pregnancy.  It is easier to loose urine during pregnancy. Tightening up and strengthening the pelvic muscles will help with this problem. Practice stopping your  urination while you are going to the bathroom. These are the same muscles you need to strengthen. It is also the muscles you would use as if you were trying to stop from passing gas. You can practice tightening these muscles up 10 times a set and repeating this about 3 times per day. Once you know what muscles to tighten up, do not perform these exercises during urination. It is more likely to contribute to an infection by backing up the urine.  Ask for help if you have financial, counseling or nutritional needs during pregnancy. Your caregiver will be able to offer counseling for these needs as well as refer you for other special needs.  Your skin may become oily. If so, wash your face with mild soap, use non-greasy moisturizer and oil or cream based makeup. MEDICATIONS AND DRUG USE IN PREGNANCY  Take prenatal vitamins as directed. The vitamin should contain 1 milligram of folic acid. Keep all vitamins out of reach of children. Only a couple vitamins or tablets containing iron may be fatal to a baby or young child when ingested.  Avoid use of all medications, including herbs, over-the-counter medications, not prescribed or suggested by your caregiver. Only take over-the-counter or prescription medicines for pain, discomfort, or fever as directed by your caregiver. Do not use aspirin.  Let your caregiver also know about herbs you may be using.  Alcohol is related to a number of birth defects. This includes fetal alcohol syndrome. All alcohol, in any form, should be avoided completely. Smoking will cause low birth rate and premature babies.  Street or illegal drugs are very harmful to the baby. They are absolutely forbidden. A baby born to an addicted mother will be addicted at birth. The baby will go through the same withdrawal an adult does. SEEK MEDICAL CARE IF:  You have any concerns or worries during your pregnancy. It is better to call with your questions if you feel they cannot wait, rather  than worry about them. SEEK IMMEDIATE MEDICAL CARE IF:   An unexplained oral temperature above 102 F (38.9 C) develops, or as your caregiver suggests.  You have leaking of fluid from the vagina (birth canal). If leaking membranes are suspected, take your temperature and tell your caregiver of this when you call.  There is vaginal spotting, bleeding, or passing clots. Tell your caregiver of the amount and how many pads are used. Light spotting in pregnancy is common, especially following intercourse.  You develop a bad smelling vaginal discharge with a change in the color from clear   to white.  You continue to feel sick to your stomach (nauseated) and have no relief from remedies suggested. You vomit blood or coffee ground-like materials.  You lose more than 2 pounds of weight or gain more than 2 pounds of weight over 1 week, or as suggested by your caregiver.  You notice swelling of your face, hands, feet, or legs.  You get exposed to German measles and have never had them.  You are exposed to fifth disease or chickenpox.  You develop belly (abdominal) pain. Round ligament discomfort is a common non-cancerous (benign) cause of abdominal pain in pregnancy. Your caregiver still must evaluate you.  You develop a bad headache that does not go away.  You develop fever, diarrhea, pain with urination, or shortness of breath.  You develop visual problems, blurry, or double vision.  You fall or are in a car accident or any kind of trauma.  There is mental or physical violence at home. Document Released: 09/18/2001 Document Revised: 12/17/2011 Document Reviewed: 03/23/2009 [employer] Patient Information 2013 [employer].  

## 2012-11-19 NOTE — Progress Notes (Signed)
Seen and agree with note Mahagony Grieb CNM 

## 2012-11-22 ENCOUNTER — Other Ambulatory Visit: Payer: Self-pay

## 2012-12-17 ENCOUNTER — Encounter: Payer: Medicaid Other | Admitting: Advanced Practice Midwife

## 2012-12-22 ENCOUNTER — Telehealth: Payer: Self-pay

## 2012-12-22 NOTE — Telephone Encounter (Signed)
Called [facility] and left a message we are returning your call, sorry we missed you, you may call clinic back to discuss answer with [place]. ( patient may have hot bath, may use epsom salts per Dr. Debroah Loop)

## 2012-12-22 NOTE — Telephone Encounter (Signed)
Patient wants to know if she is able to soak in a hot bath and use epsom salts.

## 2012-12-23 NOTE — Telephone Encounter (Signed)
Called pt and left message that what she asked about is fine. She can call back if she has additional questions.

## 2012-12-24 ENCOUNTER — Encounter: Payer: Self-pay | Admitting: Advanced Practice Midwife

## 2012-12-24 ENCOUNTER — Ambulatory Visit (INDEPENDENT_AMBULATORY_CARE_PROVIDER_SITE_OTHER): Payer: Medicaid Other | Admitting: Advanced Practice Midwife

## 2012-12-24 ENCOUNTER — Other Ambulatory Visit: Payer: Self-pay | Admitting: Advanced Practice Midwife

## 2012-12-24 VITALS — BP 137/78 | Temp 97.1°F | Wt 270.3 lb

## 2012-12-24 LAB — POCT URINALYSIS DIP (DEVICE)
Glucose, UA: NEGATIVE mg/dL
Hgb urine dipstick: NEGATIVE
Specific Gravity, Urine: 1.025 (ref 1.005–1.030)
Urobilinogen, UA: 0.2 mg/dL (ref 0.0–1.0)
pH: 6.5 (ref 5.0–8.0)

## 2012-12-24 NOTE — Progress Notes (Signed)
Cannot do glucola today. Will schedule as outpatient. Will schedule followup anatomy US.

## 2012-12-31 ENCOUNTER — Other Ambulatory Visit: Payer: Medicaid Other

## 2012-12-31 ENCOUNTER — Ambulatory Visit (HOSPITAL_COMMUNITY)
Admission: RE | Admit: 2012-12-31 | Discharge: 2012-12-31 | Disposition: A | Discharge status: Home / Self Care | Payer: Medicaid Other | Source: Ambulatory Visit | Attending: Advanced Practice Midwife | Admitting: Advanced Practice Midwife

## 2012-12-31 DIAGNOSIS — Z3482 Encounter for supervision of other normal pregnancy, second trimester: Secondary | ICD-10-CM

## 2012-12-31 DIAGNOSIS — Z3490 Encounter for supervision of normal pregnancy, unspecified, unspecified trimester: Secondary | ICD-10-CM

## 2012-12-31 DIAGNOSIS — Z3689 Encounter for other specified antenatal screening: Secondary | ICD-10-CM | POA: Insufficient documentation

## 2012-12-31 DIAGNOSIS — O09299 Supervision of pregnancy with other poor reproductive or obstetric history, unspecified trimester: Secondary | ICD-10-CM | POA: Insufficient documentation

## 2012-12-31 DIAGNOSIS — O99210 Obesity complicating pregnancy, unspecified trimester: Secondary | ICD-10-CM | POA: Insufficient documentation

## 2012-12-31 DIAGNOSIS — E669 Obesity, unspecified: Secondary | ICD-10-CM | POA: Insufficient documentation

## 2012-12-31 DIAGNOSIS — O34219 Maternal care for unspecified type scar from previous cesarean delivery: Secondary | ICD-10-CM | POA: Insufficient documentation

## 2012-12-31 LAB — CBC
Hemoglobin: 11.8 g/dL — ABNORMAL LOW (ref 12.0–15.0)
MCH: 27.6 pg (ref 26.0–34.0)
RBC: 4.28 MIL/uL (ref 3.87–5.11)

## 2013-01-01 LAB — RPR

## 2013-01-01 LAB — GLUCOSE TOLERANCE, 1 HOUR (50G) W/O FASTING: Glucose, 1 Hour GTT: 68 mg/dL — ABNORMAL LOW (ref 70–140)

## 2013-01-07 ENCOUNTER — Encounter: Payer: Medicaid Other | Admitting: Advanced Practice Midwife

## 2013-01-14 ENCOUNTER — Other Ambulatory Visit: Payer: Self-pay | Admitting: Family Medicine

## 2013-01-14 ENCOUNTER — Ambulatory Visit (INDEPENDENT_AMBULATORY_CARE_PROVIDER_SITE_OTHER): Payer: Medicaid Other | Admitting: Family Medicine

## 2013-01-14 VITALS — BP 126/80 | Temp 96.7°F | Wt 268.9 lb

## 2013-01-14 DIAGNOSIS — O34219 Maternal care for unspecified type scar from previous cesarean delivery: Secondary | ICD-10-CM

## 2013-01-14 DIAGNOSIS — E669 Obesity, unspecified: Secondary | ICD-10-CM

## 2013-01-14 DIAGNOSIS — Z3483 Encounter for supervision of other normal pregnancy, third trimester: Secondary | ICD-10-CM

## 2013-01-14 LAB — POCT URINALYSIS DIP (DEVICE)
Ketones, ur: NEGATIVE mg/dL
Protein, ur: NEGATIVE mg/dL
Specific Gravity, Urine: 1.025 (ref 1.005–1.030)
pH: 7 (ref 5.0–8.0)

## 2013-01-14 NOTE — Progress Notes (Signed)
Pt. C/o severe hip pain.  It comes and goes and is not relieved by sitting, standing, or lying down.

## 2013-01-14 NOTE — Patient Instructions (Addendum)
Normal Labor and Delivery  Your caregiver must first be sure you are in labor. Signs of labor include:  · You may pass what is called "the mucus plug" before labor begins. This is a small amount of blood stained mucus.  · Regular uterine contractions.  · The time between contractions get closer together.  · The discomfort and pain gradually gets more intense.  · Pains are mostly located in the back.  · Pains get worse when walking.  · The cervix (the opening of the uterus becomes thinner (begins to efface) and opens up (dilates).  Once you are in labor and admitted into the hospital or care center, your caregiver will do the following:  · A complete physical examination.  · Check your vital signs (blood pressure, pulse, temperature and the fetal heart rate).  · Do a vaginal examination (using a sterile glove and lubricant) to determine:  · The position (presentation) of the baby (head [vertex] or buttock first).  · The level (station) of the baby's head in the birth canal.  · The effacement and dilatation of the cervix.  · You may have your pubic hair shaved and be given an enema depending on your caregiver and the circumstance.  · An electronic monitor is usually placed on your abdomen. The monitor follows the length and intensity of the contractions, as well as the baby's heart rate.  · Usually, your caregiver will insert an IV in your arm with a bottle of sugar water. This is done as a precaution so that medications can be given to you quickly during labor or delivery.  NORMAL LABOR AND DELIVERY IS DIVIDED UP INTO 3 STAGES:  First Stage  This is when regular contractions begin and the cervix begins to efface and dilate. This stage can last from 3 to 15 hours. The end of the first stage is when the cervix is 100% effaced and 10 centimeters dilated. Pain medications may be given by   · Injection (morphine, demerol, etc.)  · Regional anesthesia (spinal, caudal or epidural, anesthetics given in different locations of  the spine). Paracervical pain medication may be given, which is an injection of and anesthetic on each side of the cervix.  A pregnant woman may request to have "Natural Childbirth" which is not to have any medications or anesthesia during her labor and delivery.  Second Stage  This is when the baby comes down through the birth canal (vagina) and is born. This can take 1 to 4 hours. As the baby's head comes down through the birth canal, you may feel like you are going to have a bowel movement. You will get the urge to bear down and push until the baby is delivered. As the baby's head is being delivered, the caregiver will decide if an episiotomy (a cut in the perineum and vagina area) is needed to prevent tearing of the tissue in this area. The episiotomy is sewn up after the delivery of the baby and placenta. Sometimes a mask with nitrous oxide is given for the mother to breath during the delivery of the baby to help if there is too much pain. The end of Stage 2 is when the baby is fully delivered. Then when the umbilical cord stops pulsating it is clamped and cut.  Third Stage  The third stage begins after the baby is completely delivered and ends after the placenta (afterbirth) is delivered. This usually takes 5 to 30 minutes. After the placenta is delivered, a medication   is given either by intravenous or injection to help contract the uterus and prevent bleeding. The third stage is not painful and pain medication is usually not necessary. If an episiotomy was done, it is repaired at this time.  After the delivery, the mother is watched and monitored closely for 1 to 2 hours to make sure there is no postpartum bleeding (hemorrhage). If there is a lot of bleeding, medication is given to contract the uterus and stop the bleeding.  Document Released: 07/03/2008 Document Revised: 12/17/2011 Document Reviewed: 07/03/2008  ExitCare® Patient Information ©2013 [employer].

## 2013-01-14 NOTE — Progress Notes (Signed)
Doing well. Contractions every few days, just a few at a time.

## 2013-01-14 NOTE — Progress Notes (Signed)
I saw and examined patient agree with resident note. Doing well. Napoleon Form, MD

## 2013-01-28 ENCOUNTER — Ambulatory Visit (INDEPENDENT_AMBULATORY_CARE_PROVIDER_SITE_OTHER): Payer: Medicaid Other | Admitting: Obstetrics and Gynecology

## 2013-01-28 VITALS — BP 130/77 | Temp 97.1°F | Wt 271.8 lb

## 2013-01-28 DIAGNOSIS — Z3483 Encounter for supervision of other normal pregnancy, third trimester: Secondary | ICD-10-CM

## 2013-01-28 DIAGNOSIS — Z3480 Encounter for supervision of other normal pregnancy, unspecified trimester: Secondary | ICD-10-CM

## 2013-01-28 LAB — POCT URINALYSIS DIP (DEVICE)
Glucose, UA: NEGATIVE mg/dL
Hgb urine dipstick: NEGATIVE
Protein, ur: NEGATIVE mg/dL
Specific Gravity, Urine: 1.02 (ref 1.005–1.030)
Urobilinogen, UA: 0.2 mg/dL (ref 0.0–1.0)

## 2013-01-28 NOTE — Progress Notes (Signed)
Doing well. Gestational edema and shooting hip pains> discussed repositioning, ice, Tylenol. Occ B-H. Has cut back to 2 hr /d After [employer].

## 2013-01-28 NOTE — Progress Notes (Signed)
Pt c/o pain in hips and legs.  Contractions 5-10 mins apart for 2 hours and irregular contractions throught out weekend.

## 2013-01-28 NOTE — Patient Instructions (Signed)
Round Ligament Pain  The round ligament is made up of muscle and fibrous tissue. It is attached to the uterus near the fallopian tube. The round ligament is located on both sides of the uterus and helps support the position of the uterus. It usually begins in the second trimester of pregnancy when the uterus comes out of the pelvis. The pain can come and go until the baby is delivered. Round ligament pain is not a serious problem and does not cause harm to the baby.  CAUSE  During pregnancy the uterus grows the most from the second trimester to delivery. As it grows, it stretches and slightly twists the round ligaments. When the uterus leans from one side to the other, the round ligament on the opposite side pulls and stretches. This can cause pain.  SYMPTOMS   Pain can occur on one side or both sides. The pain is usually a short, sharp, and pinching-like. Sometimes it can be a dull, lingering and aching pain. The pain is located in the lower side of the abdomen or in the groin. The pain is internal and usually starts deep in the groin and moves up to the outside of the hip area. Pain can occur with:  · Sudden change in position like getting out of bed or a chair.  · Rolling over in bed.  · Coughing or sneezing.  · Walking too much.  · Any type of physical activity.  DIAGNOSIS   Your caregiver will make sure there are no serious problems causing the pain. When nothing serious is found, the symptoms usually indicate that the pain is from the round ligament.  TREATMENT   · Sit down and relax when the pain starts.  · Flex your knees up to your belly.  · Lay on your side with a pillow under your belly (abdomen) and another one between your legs.  · Sit in a hot bath for 15 to 20 minutes or until the pain goes away.  HOME CARE INSTRUCTIONS   · Only take over-the-counter or prescriptions medicines for pain, discomfort or fever as directed by your caregiver.  · Sit and stand slowly.  · Avoid long walks if it causes  pain.  · Stop or lessen your physical activities if it causes pain.  SEEK MEDICAL CARE IF:   · The pain does not go away with any of your treatment.  · You need stronger medication for the pain.  · You develop back pain that you did not have before with the side pain.  SEEK IMMEDIATE MEDICAL CARE IF:   · You develop a temperature of 102° F (38.9° C) or higher.  · You develop uterine contractions.  · You develop vaginal bleeding.  · You develop nausea, vomiting or diarrhea.  · You develop chills.  · You have pain when you urinate.  Document Released: 07/03/2008 Document Revised: 12/17/2011 Document Reviewed: 07/03/2008  ExitCare® Patient Information ©2013 [employer].

## 2013-02-10 IMAGING — US US OB DETAIL+14 WK
1 of 2 series · 12 of 28 positions shown · non-contrast
Comparison: none

[Series 1: us ob detail +14 wk · 12 of 86 slices shown]
[im 4/86]
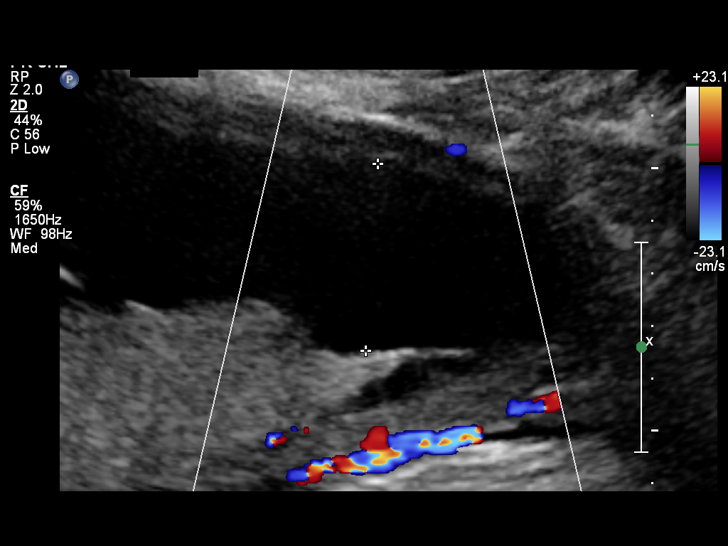
[im 10/86]
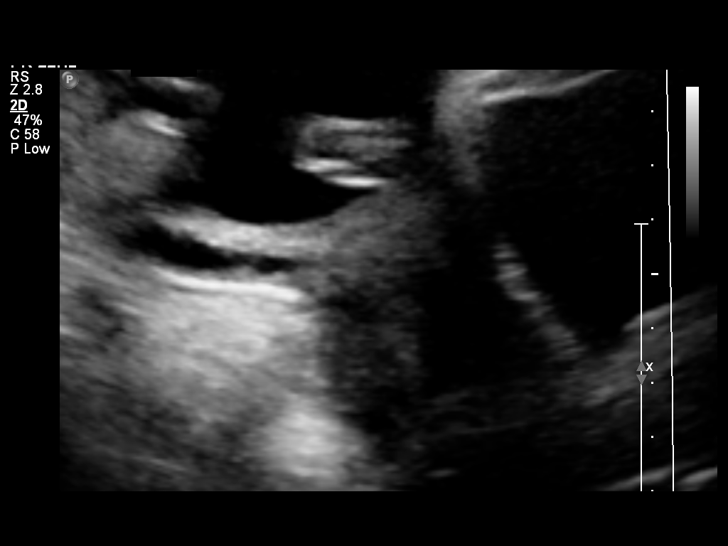
[im 17/86]
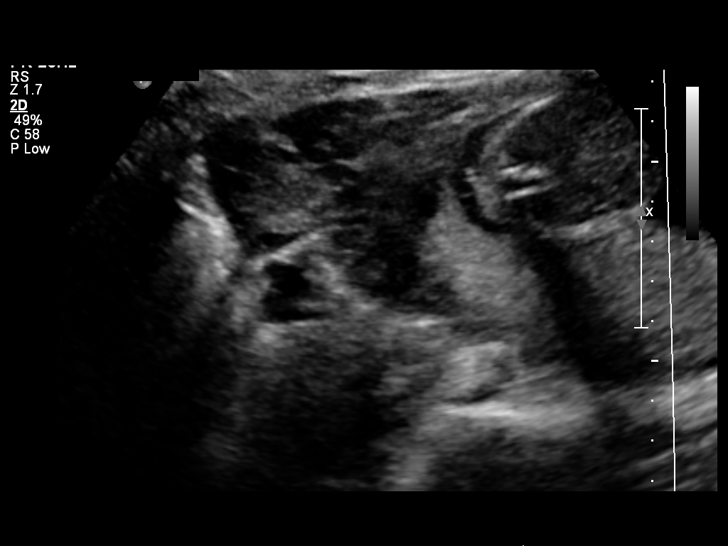
[im 27/86]
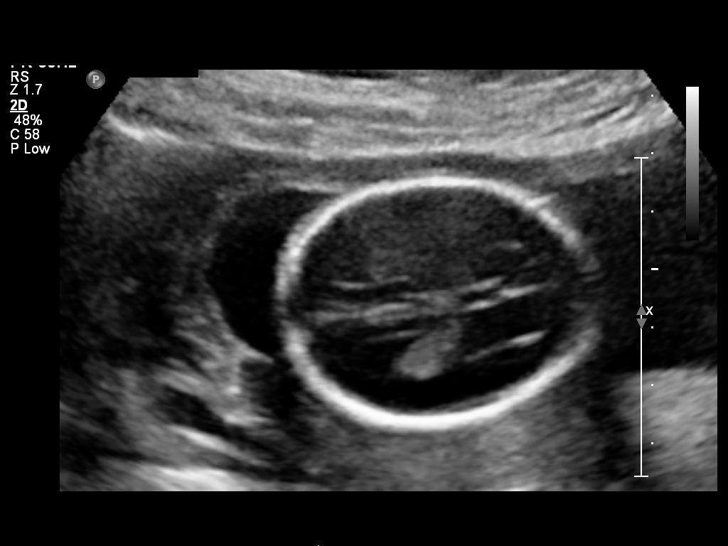
[im 33/86]
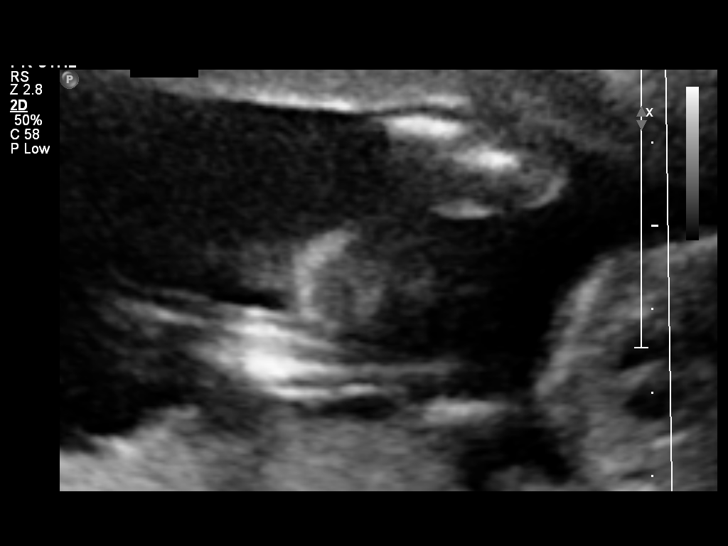
[im 40/86]
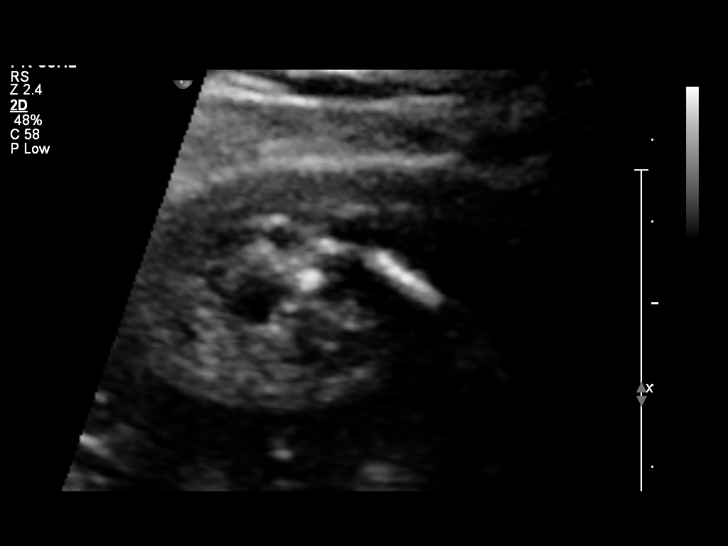
[im 50/86]
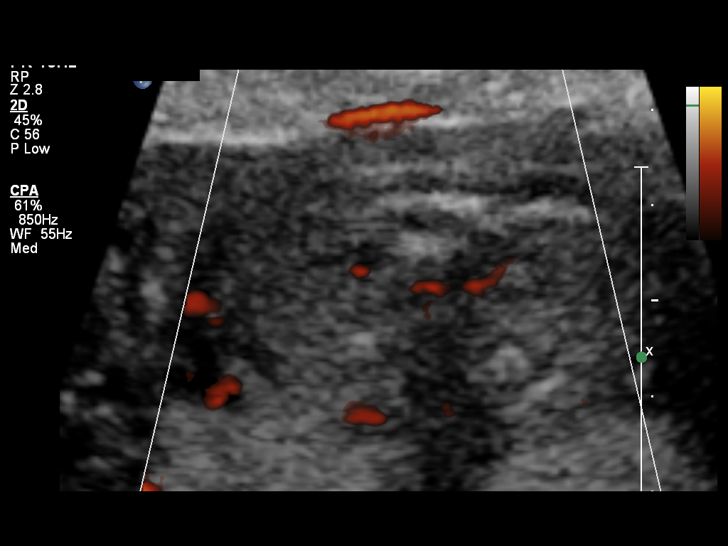
[im 56/86]
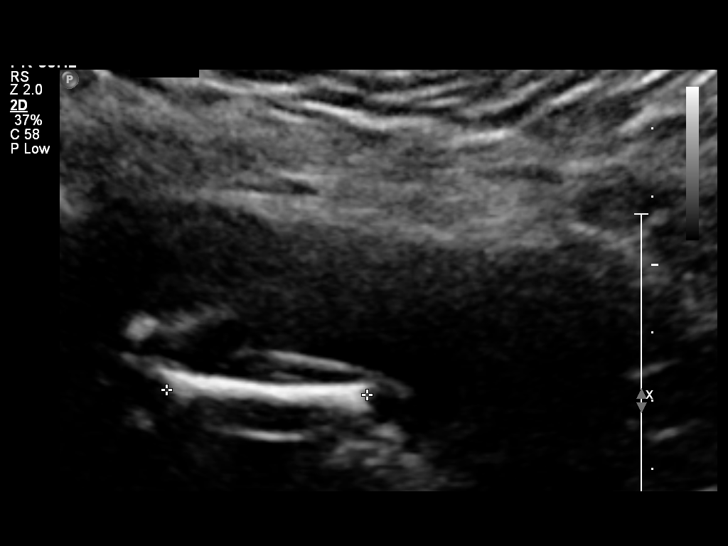
[im 63/86]
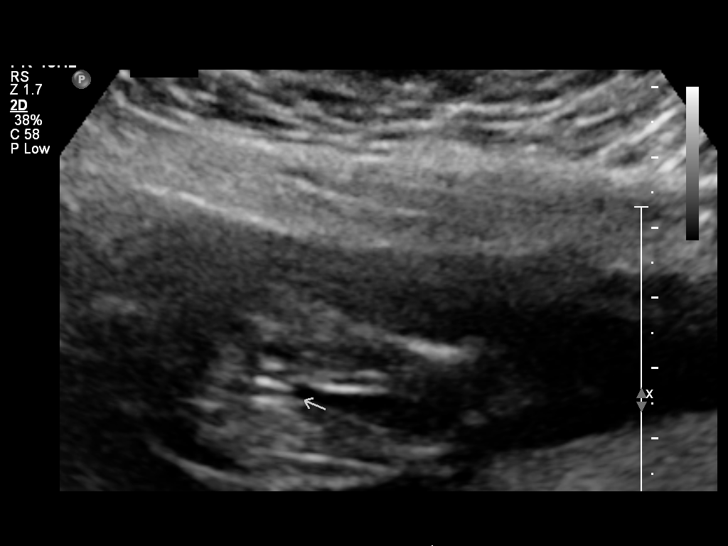
[im 72/86]
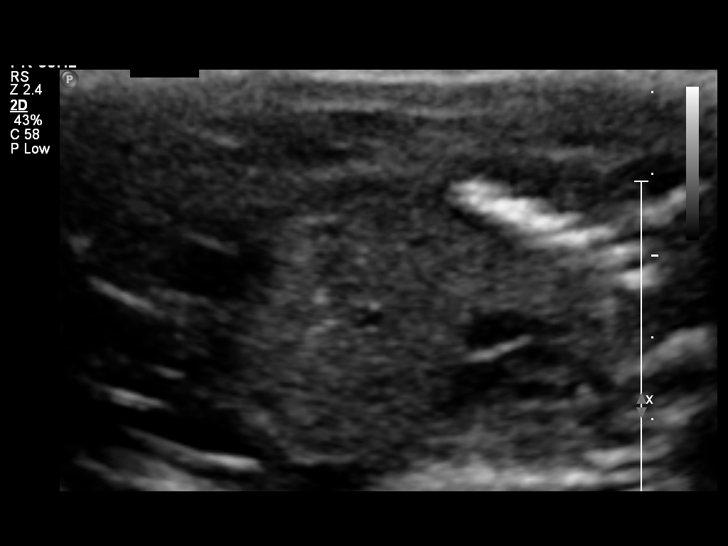
[im 79/86]
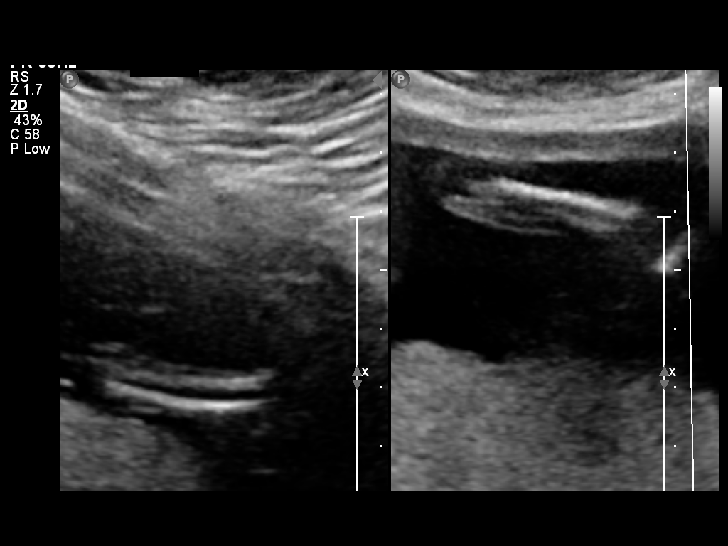
[im 86/86]
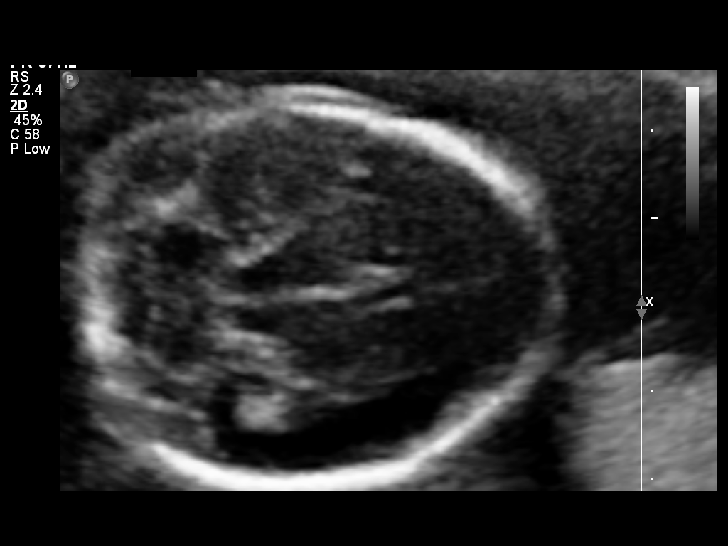

[12 of 28 positions shown; findings below may reference images not displayed]

OBSTETRICS REPORT
                      (Signed Final 10/27/2012 [DATE])

Service(s) Provided

 US OB DETAIL + 14 WK                                  76811.0
Indications

 Prior Csection
 Poor obstetric history: Previous gestational HTN
 Hypertension - Gestational  (possible)
Fetal Evaluation

 Num Of Fetuses:    1
 Fetal Heart Rate:  160                         bpm
 Cardiac Activity:  Observed
 Presentation:      Breech
 Placenta:          Posterior, above cervical
                    os
 P. Cord            Visualized, central
 Insertion:

 Amniotic Fluid
 AFI FV:      Subjectively within normal limits
                                             Larg Pckt:   3.56   cm
Biometry

 BPD:     41.1  mm    G. Age:   18w 3d                CI:        67.94   70 - 86
                                                      FL/HC:      19.3   16.1 -

 HC:     159.5  mm    G. Age:   18w 6d       33  %    HC/AC:      1.18   1.09 -

 AC:     135.5  mm    G. Age:   19w 0d       46  %    FL/BPD:
 FL:      30.8  mm    G. Age:   19w 4d       63  %    FL/AC:      22.7   20 - 24
 HUM:     28.5  mm    G. Age:   19w 1d       56  %
 NFT:     4.38  mm

 Est. FW:     279  gm    0 lb 10 oz      48  %
Gestational Age

 LMP:           19w 0d       Date:   06/16/12                 EDD:   03/23/13
 U/S Today:     19w 0d                                        EDD:   03/23/13
 Best:          19w 0d    Det. By:   LMP  (06/16/12)          EDD:   03/23/13
Anatomy
 Cranium:          Appears normal         Aortic Arch:      Not well visualized
 Fetal Cavum:      Appears normal         Ductal Arch:      Not well visualized
 Ventricles:       Appears normal         Diaphragm:        Appears normal
 Choroid Plexus:   Appears normal         Stomach:          Appears normal, left
                                                            sided
 Cerebellum:       Appears normal         Abdomen:          Appears normal
 Posterior Fossa:  Appears normal         Abdominal Wall:   Appears nml (cord
                                                            insert, abd wall)
 Nuchal Fold:      Appears normal         Cord Vessels:     Appears normal (3
                                                            vessel cord)
 Face:             Appears normal         Kidneys:          Appear normal
                   (orbits and profile)
 Lips:             Appears normal         Bladder:          Appears normal
 Heart:            Appears normal         Spine:            Appears normal
                   (4CH, axis, and
                   situs)
 RVOT:             Previously seen        Lower             Appears normal
                                          Extremities:
 LVOT:             Appears normal         Upper             Appears normal
                                          Extremities:

 Other:  Fetus appears to be a female. Heels visualized. Nasal bone
         visualized. Technically difficult due to fetal position.
Targeted Anatomy

 Fetal Central Nervous System
 Lat. Ventricles:
Cervix Uterus Adnexa

 Cervical Length:   3.93      cm

 Cervix:       Normal appearance by transabdominal scan.
 Left Ovary:   Within normal limits.
 Right Ovary:  Within normal limits.

 Adnexa:     No abnormality visualized.
Impression

 Single living intrauterine pregnancy in breech presentation.
 The estimated gestational age is 19w 0d based on LMP
 (06/16/12). No fetal anomalies are identified.

## 2013-02-11 ENCOUNTER — Inpatient Hospital Stay (HOSPITAL_COMMUNITY)
Admission: AD | Admit: 2013-02-11 | Discharge: 2013-02-11 | Disposition: A | Discharge status: Home / Self Care | Payer: Medicaid Other | Source: Ambulatory Visit | Attending: Obstetrics & Gynecology | Admitting: Obstetrics & Gynecology

## 2013-02-11 ENCOUNTER — Encounter (HOSPITAL_COMMUNITY): Payer: Self-pay | Admitting: *Deleted

## 2013-02-11 ENCOUNTER — Encounter: Payer: Medicaid Other | Admitting: Family Medicine

## 2013-02-11 DIAGNOSIS — R197 Diarrhea, unspecified: Secondary | ICD-10-CM | POA: Insufficient documentation

## 2013-02-11 DIAGNOSIS — O26899 Other specified pregnancy related conditions, unspecified trimester: Secondary | ICD-10-CM

## 2013-02-11 DIAGNOSIS — O1203 Gestational edema, third trimester: Secondary | ICD-10-CM

## 2013-02-11 DIAGNOSIS — IMO0002 Reserved for concepts with insufficient information to code with codable children: Secondary | ICD-10-CM | POA: Insufficient documentation

## 2013-02-11 DIAGNOSIS — O99891 Other specified diseases and conditions complicating pregnancy: Secondary | ICD-10-CM | POA: Insufficient documentation

## 2013-02-11 DIAGNOSIS — R109 Unspecified abdominal pain: Secondary | ICD-10-CM | POA: Insufficient documentation

## 2013-02-11 LAB — URINALYSIS, ROUTINE W REFLEX MICROSCOPIC
Glucose, UA: NEGATIVE mg/dL
Hgb urine dipstick: NEGATIVE
Protein, ur: NEGATIVE mg/dL

## 2013-02-11 LAB — URINE MICROSCOPIC-ADD ON

## 2013-02-11 LAB — WET PREP, GENITAL

## 2013-02-11 NOTE — MAU Note (Signed)
Patient state she made a day trip to the beach on Sunday and has had swelling in the feet and ankles since that time. Usually has swelling but it goes away. Missed her appointment this am at the [facility]. States she has some irregular contractions. State she has diarrhea 1-2 times a day for the past 1-2 weeks. Denies leaking or bleeding and reports good fetal movement.

## 2013-02-11 NOTE — MAU Provider Note (Signed)
History     CSN: [id number]  Arrival date & time 02/11/13  1309   First Provider Initiated Contact with Patient 02/11/13 1412      Chief Complaint  Patient presents with  . Leg Swelling  . Diarrhea  . Abdominal Cramping    HPI  Cathy Webster is a 22 y.o. G2P1001 at [redacted]w[redacted]d who presents today with complaints of feet and leg swelling, abdominal cramping, and diarrhea.  Pt says that for the last four days her feet have been swollen. The swelling started two weeks ago and usually goes away when she props hr feet up, but it hasn't gone away this time. Denies any headache, vision changes, or RUQ pain. Occasionally has some epigastric pain.  She has had diarrhea on a daily basis for the last two weeks. No blood in her stool. The stool is always loose over the last 2 weeks. She has not had any normal stools. None of her contacts have had any diarrhea.  She also complains of bilateral posterior hip pain. The pain alternates between sides. She notices it more with walking. Has also had cramping that started three days ago. Says it feels like Braxton-Hicks contractions. Hasn't taken any medicines for this.   Denies vaginal bleeding or fluid leaking. Has had good fetal movement, last time she felt baby move was just now. Has had good PO intake. Has had normal vaginal discharge.   For prenatal care goes to the [facility]. Denies having any problems during this pregnancy.  Past Medical History  Diagnosis Date  . Abnormal Pap smear 2008    Past Surgical History  Procedure Laterality Date  . Cesarean section    . Tonsillectomy      Family History  Problem Relation Age of Onset  . Diabetes Mother   . Asthma Brother     History  Substance Use Topics  . Smoking status: Never Smoker   . Smokeless tobacco: Never Used  . Alcohol Use: No    OB History   Grav Para Term Preterm Abortions TAB SAB Ect Mult Living   2 1 1       1     one prior cesarean section at 38w  gestation, desires VBAC during this pregnancy.  Review of Systems  Allergies  Review of patient's allergies indicates no known allergies.  Home Medications  Prenatal vitamins Takes tylenol prn pain Robitussin if has cold  BP 134/74  Pulse 96  Temp(Src) 98.4 F (36.9 C) (Oral)  Resp 20  Ht 5' 3.5" (1.613 m)  Wt 279 lb 3.2 oz (126.644 kg)  BMI 48.68 kg/m2  SpO2 100%  LMP 06/16/2012 Fundal height: 35cm  Physical Exam Gen: NAD. Morbidly obese HEENT: MMM Heart: RRR Lungs: CTAB, NWOB Abd: gravid but otherwise soft, nontender to palpation Ext: 1+ pitting edema in bilateral lower extremities Neuro: grossly nonfocal, speech intact GU: normal appearing external genitalia. Normal white discharge present in the vagina. Dilation: Closed Effacement (%): Thick Cervical Position: Middle Station: Ballotable Presentation: Undeterminable Exam by:: Dr. Pollie Meyer   MAU Course  Procedures (including critical care time)  Labs Reviewed  URINALYSIS, ROUTINE W REFLEX MICROSCOPIC - Abnormal; Notable for the following:    APPearance HAZY (*)    Ketones, ur 15 (*)    Leukocytes, UA TRACE (*)    All other components within normal limits  URINE MICROSCOPIC-ADD ON - Abnormal; Notable for the following:    Squamous Epithelial / LPF FEW (*)    All other components  within normal limits  GC/CHLAMYDIA PROBE AMP  WET PREP, GENITAL   No results found.   1. Pain of round ligament complicating pregnancy, antepartum   2. Edema in pregnancy, third trimester      FHR: baseline 140s, moderate variability, + accels, no decels Toco: no ctx seen    MDM  [name] is a 22 y.o. G2P1001 at [redacted]w[redacted]d who presents complaining of lower extremity edema, cramping, and diarrhea.  Cramping: cervix closed. Will send gc/chlamydia and wet prep. No ctx seen on monitor. Not concerning for preterm labor at this time.  Diarrhea: upon further questioning this is just a soft stool occuring once per day.  Counseled pt that this is not a problem as long as it is just once a day.  Hip pain: likely musculoskeletal pain from pregnancy  Swelling: likely physiologic given normal BP, no protein on dip, no HA or vision changes, no RUQ pain. Continue to monitor.  Pt instructed to f/u in clinic in one week  Levert Feinstein, MD [facility] Medicine PGY-1  I examined pt and agree with documentation above and resident plan of care. [facility]

## 2013-02-11 NOTE — MAU Note (Signed)
Cramping, diarrhea, and swelling of feet and hands x 1-2 weeks.  Swelling not resolved by laying for 3 days.

## 2013-02-12 LAB — GC/CHLAMYDIA PROBE AMP
CT Probe RNA: NEGATIVE
GC Probe RNA: NEGATIVE

## 2013-02-18 ENCOUNTER — Encounter: Payer: Self-pay | Admitting: Obstetrics and Gynecology

## 2013-02-18 ENCOUNTER — Ambulatory Visit (INDEPENDENT_AMBULATORY_CARE_PROVIDER_SITE_OTHER): Payer: Medicaid Other | Admitting: Obstetrics and Gynecology

## 2013-02-18 VITALS — BP 119/81 | Temp 97.7°F | Wt 279.0 lb

## 2013-02-18 DIAGNOSIS — Z3480 Encounter for supervision of other normal pregnancy, unspecified trimester: Secondary | ICD-10-CM

## 2013-02-18 DIAGNOSIS — Z3483 Encounter for supervision of other normal pregnancy, third trimester: Secondary | ICD-10-CM

## 2013-02-18 DIAGNOSIS — R609 Edema, unspecified: Secondary | ICD-10-CM

## 2013-02-18 DIAGNOSIS — O163 Unspecified maternal hypertension, third trimester: Secondary | ICD-10-CM

## 2013-02-18 DIAGNOSIS — O139 Gestational [pregnancy-induced] hypertension without significant proteinuria, unspecified trimester: Secondary | ICD-10-CM

## 2013-02-18 LAB — POCT URINALYSIS DIP (DEVICE)
Bilirubin Urine: NEGATIVE
Hgb urine dipstick: NEGATIVE
Ketones, ur: 15 mg/dL — AB
pH: 7 (ref 5.0–8.0)

## 2013-02-18 NOTE — Progress Notes (Signed)
Discussed dependent edema, backache and work at [employer], school in [employer] less standing, note to stop work. GBS done.

## 2013-02-18 NOTE — Patient Instructions (Signed)
Edema Edema is an abnormal build-up of fluids in tissues. Because this is partly dependent on gravity (water flows to the lowest place), it is more common in the legs and thighs (lower extremities). It is also common in the looser tissues, like around the eyes. Painless swelling of the feet and ankles is common and increases as a person ages. It may affect both legs and may include the calves or even thighs. When squeezed, the fluid may move out of the affected area and may leave a dent for a few moments. CAUSES   Prolonged standing or sitting in one place for extended periods of time. Movement helps pump tissue fluid into the veins, and absence of movement prevents this, resulting in edema.  Varicose veins. The valves in the veins do not work as well as they should. This causes fluid to leak into the tissues.  Fluid and salt overload.  Injury, burn, or surgery to the leg, ankle, or foot, may damage veins and allow fluid to leak out.  Sunburn damages vessels. Leaky vessels allow fluid to go out into the sunburned tissues.  Allergies (from insect bites or stings, medications or chemicals) cause swelling by allowing vessels to become leaky.  Protein in the blood helps keep fluid in your vessels. Low protein, as in malnutrition, allows fluid to leak out.  Hormonal changes, including pregnancy and menstruation, cause fluid retention. This fluid may leak out of vessels and cause edema.  Medications that cause fluid retention. Examples are sex hormones, blood pressure medications, steroid treatment, or anti-depressants.  Some illnesses cause edema, especially heart failure, kidney disease, or liver disease.  Surgery that cuts veins or lymph nodes, such as surgery done for the heart or for breast cancer, may result in edema. DIAGNOSIS  Your caregiver is usually easily able to determine what is causing your swelling (edema) by simply asking what is wrong (getting a history) and examining you (doing  a physical). Sometimes x-rays, EKG (electrocardiogram or heart tracing), and blood work may be done to evaluate for underlying medical illness. TREATMENT  General treatment includes:  Leg elevation (or elevation of the affected body part).  Restriction of fluid intake.  Prevention of fluid overload.  Compression of the affected body part. Compression with elastic bandages or support stockings squeezes the tissues, preventing fluid from entering and forcing it back into the blood vessels.  Diuretics (also called water pills or fluid pills) pull fluid out of your body in the form of increased urination. These are effective in reducing the swelling, but can have side effects and must be used only under your caregiver's supervision. Diuretics are appropriate only for some types of edema. The specific treatment can be directed at any underlying causes discovered. Heart, liver, or kidney disease should be treated appropriately. HOME CARE INSTRUCTIONS   Elevate the legs (or affected body part) above the level of the heart, while lying down.  Avoid sitting or standing still for prolonged periods of time.  Avoid putting anything directly under the knees when lying down, and do not wear constricting clothing or garters on the upper legs.  Exercising the legs causes the fluid to work back into the veins and lymphatic channels. This may help the swelling go down.  The pressure applied by elastic bandages or support stockings can help reduce ankle swelling.  A low-salt diet may help reduce fluid retention and decrease the ankle swelling.  Take any medications exactly as prescribed. SEEK MEDICAL CARE IF:  Your edema is   not responding to recommended treatments. SEEK IMMEDIATE MEDICAL CARE IF:   You develop shortness of breath or chest pain.  You cannot breathe when you lay down; or if, while lying down, you have to get up and go to the window to get your breath.  You are having increasing  swelling without relief from treatment.  You develop a fever over 102 F (38.9 C).  You develop pain or redness in the areas that are swollen.  Tell your caregiver right away if you have gained 3 lb/1.4 kg in 1 day or 5 lb/2.3 kg in a week. MAKE SURE YOU:   Understand these instructions.  Will watch your condition.  Will get help right away if you are not doing well or get worse. Document Released: 09/24/2005 Document Revised: 03/25/2012 Document Reviewed: 05/12/2008 ExitCare Patient Information 2013 [employer].  

## 2013-02-18 NOTE — Progress Notes (Signed)
P=98, 1+ edema in feet , c/o pelvic pressure, c/o irregular braxton hicks,

## 2013-02-21 LAB — CULTURE, BETA STREP (GROUP B ONLY)

## 2013-02-23 ENCOUNTER — Telehealth: Payer: Self-pay | Admitting: *Deleted

## 2013-02-23 NOTE — Telephone Encounter (Signed)
Patient called and stated that she is having cramps this morning. They feel like gas pain to her. They are constant and worse with walking around. She denies any bleeding, discharge, or fluid leakage. Baby is moving well. I advised patient that she could come to MAU to be evaluated if she would like to. Patient opted to wait at home for a while and drink some water and rest. I advised that if she develops bleeding or fluid leakage or if baby is not moving that she should go to MAU immediately. Pt agrees.

## 2013-03-04 ENCOUNTER — Encounter: Payer: Self-pay | Admitting: Advanced Practice Midwife

## 2013-03-04 ENCOUNTER — Ambulatory Visit (INDEPENDENT_AMBULATORY_CARE_PROVIDER_SITE_OTHER): Payer: Medicaid Other | Admitting: Advanced Practice Midwife

## 2013-03-04 VITALS — BP 125/76 | Temp 99.2°F | Wt 278.0 lb

## 2013-03-04 DIAGNOSIS — Z3483 Encounter for supervision of other normal pregnancy, third trimester: Secondary | ICD-10-CM

## 2013-03-04 DIAGNOSIS — O139 Gestational [pregnancy-induced] hypertension without significant proteinuria, unspecified trimester: Secondary | ICD-10-CM

## 2013-03-04 DIAGNOSIS — O34219 Maternal care for unspecified type scar from previous cesarean delivery: Secondary | ICD-10-CM

## 2013-03-04 LAB — POCT URINALYSIS DIP (DEVICE)
Bilirubin Urine: NEGATIVE
Glucose, UA: NEGATIVE mg/dL
Specific Gravity, Urine: 1.025 (ref 1.005–1.030)

## 2013-03-04 NOTE — Patient Instructions (Signed)
Pregnancy - Third Trimester  The third trimester begins at the 28th week of pregnancy and ends at birth. It is important to follow your doctor's instructions.  HOME CARE    Go to your doctor's visits.   Do not smoke.   Do not drink alcohol or use drugs.   Only take medicine as told by your doctor.   Take prenatal vitamins as told. The vitamin should contain 1 milligram of folic acid.   Exercise.   Eat healthy foods. Eat regular, well-balanced meals.   You can have sex (intercourse) if there are no other problems with the pregnancy.   Do not use hot tubs, steam rooms, or saunas.   Wear a seat belt while driving.   Avoid raw meat, uncooked cheese, and litter boxes and soil used by cats.   Rest with your legs raised (elevated).   Make a list of emergency phone numbers. Keep this list with you.   Arrange for help when you come back home after delivering the baby.   Make a trial run to the hospital.   Take prenatal classes.   Prepare the baby's nursery.   Do not travel out of the city. If you absolutely have to, get permission from your doctor first.   Wear flat shoes. Do not wear high heels.  GET HELP RIGHT AWAY IF:    You have a temperature by mouth above 102 F (38.9 C), not controlled by medicine.   You have not felt the baby move for more than 1 hour. If you think the baby is not moving as much as normal, eat something with sugar in it or lie down on your left side for an hour. The baby should move at least 4 to 5 times per hour.   Fluid is coming from the vagina.   Blood is coming from the vagina. Light spotting is common, especially after sex (intercourse).   You have belly (abdominal) pain.   You have a bad smelling fluid (discharge) coming from the vagina. The fluid changes from clear to white.   You still feel sick to your stomach (nauseous).   You throw up (vomit) for more than 24 hours.   You have the chills.   You have shortness of breath.   You have a burning feeling when you  pee (urinate).   You lose or gain more than 2 pounds (0.9 kilograms) of weight over a week, or as told by your doctor.   Your face, hands, feet, or legs get puffy (swell).   You have a bad headache that will not go away.   You start to have problems seeing (blurry or double vision).   You fall, are in a car accident, or have any kind of trauma.   There is mental or physical violence at home.   You have any concerns or worries during your pregnancy.  MAKE SURE YOU:    Understand these instructions.   Will watch your condition.   Will get help right away if you are not doing well or get worse.  Document Released: 12/19/2009 Document Revised: 12/17/2011 Document Reviewed: 12/19/2009  ExitCare Patient Information 2014 [employer].

## 2013-03-04 NOTE — Progress Notes (Signed)
Well, c/o some swelling which improves with rest. Some pain and a possible lump felt in ankle when swollen, not present today. Rev'd precautions.

## 2013-03-04 NOTE — Progress Notes (Signed)
Pulse 100 Edema trace in feet. C/o pain on right ankle.

## 2013-03-05 ENCOUNTER — Encounter: Payer: Self-pay | Admitting: *Deleted

## 2013-03-11 ENCOUNTER — Ambulatory Visit (INDEPENDENT_AMBULATORY_CARE_PROVIDER_SITE_OTHER): Payer: Medicaid Other | Admitting: Advanced Practice Midwife

## 2013-03-11 VITALS — BP 126/78 | Temp 98.1°F | Wt 278.6 lb

## 2013-03-11 DIAGNOSIS — O9989 Other specified diseases and conditions complicating pregnancy, childbirth and the puerperium: Secondary | ICD-10-CM

## 2013-03-11 DIAGNOSIS — R109 Unspecified abdominal pain: Secondary | ICD-10-CM

## 2013-03-11 DIAGNOSIS — O26899 Other specified pregnancy related conditions, unspecified trimester: Secondary | ICD-10-CM

## 2013-03-11 LAB — POCT URINALYSIS DIP (DEVICE)
Glucose, UA: NEGATIVE mg/dL
Hgb urine dipstick: NEGATIVE
Ketones, ur: 40 mg/dL — AB
Nitrite: NEGATIVE
Specific Gravity, Urine: >=1.03 (ref 1.005–1.030)

## 2013-03-11 NOTE — Progress Notes (Signed)
P= 110,  

## 2013-03-11 NOTE — Patient Instructions (Signed)

## 2013-03-11 NOTE — Progress Notes (Signed)
Cathy Webster 21 y.o. [redacted]w[redacted]d feeling contractions every intermittently, otherwise feeling tired. Swelling in bilateral legs and feet, that recedes when she places feet up. O: BP 126/78  Temp(Src) 98.1 F (36.7 C)  Wt 278 lb 9.6 oz (126.372 kg)  BMI 48.57 kg/m2  LMP 06/16/2012 Abd: gravid  A/P: - VBAC, papers signed - baby girl - O positive - GBS negative - F/U 1 week

## 2013-03-11 NOTE — Progress Notes (Signed)
I have seen the patient with the resident/student and agree with the above.  Kamyra Schroeck Donovan  

## 2013-03-15 ENCOUNTER — Inpatient Hospital Stay (HOSPITAL_COMMUNITY)
Admission: AD | Admit: 2013-03-15 | Discharge: 2013-03-15 | Disposition: A | Discharge status: Home / Self Care | Payer: Medicaid Other | Source: Ambulatory Visit | Attending: Obstetrics & Gynecology | Admitting: Obstetrics & Gynecology

## 2013-03-15 ENCOUNTER — Encounter (HOSPITAL_COMMUNITY): Payer: Self-pay | Admitting: *Deleted

## 2013-03-15 DIAGNOSIS — O479 False labor, unspecified: Secondary | ICD-10-CM | POA: Insufficient documentation

## 2013-03-15 NOTE — MAU Note (Signed)
Roney Marion CNM notified of pt SVE.  Orders rec'd.

## 2013-03-15 NOTE — MAU Note (Signed)
Labor

## 2013-03-15 NOTE — MAU Note (Signed)
Pt laughing, states she hopes this is it

## 2013-03-18 ENCOUNTER — Ambulatory Visit (INDEPENDENT_AMBULATORY_CARE_PROVIDER_SITE_OTHER): Payer: Medicaid Other | Admitting: Advanced Practice Midwife

## 2013-03-18 VITALS — BP 129/73 | Temp 97.1°F | Wt 281.4 lb

## 2013-03-18 DIAGNOSIS — O34219 Maternal care for unspecified type scar from previous cesarean delivery: Secondary | ICD-10-CM

## 2013-03-18 DIAGNOSIS — O139 Gestational [pregnancy-induced] hypertension without significant proteinuria, unspecified trimester: Secondary | ICD-10-CM

## 2013-03-18 DIAGNOSIS — Z3483 Encounter for supervision of other normal pregnancy, third trimester: Secondary | ICD-10-CM

## 2013-03-18 LAB — POCT URINALYSIS DIP (DEVICE)
Bilirubin Urine: NEGATIVE
Glucose, UA: NEGATIVE mg/dL
Hgb urine dipstick: NEGATIVE
Ketones, ur: 15 mg/dL — AB
Nitrite: NEGATIVE
Specific Gravity, Urine: 1.02 (ref 1.005–1.030)

## 2013-03-18 NOTE — Progress Notes (Signed)
Feeling some occasional contractions. Did not sleep well at night.

## 2013-03-18 NOTE — Progress Notes (Signed)
Pulse- 95 Patient reports occasional contractions and pelvic pressure; reports fetal movement today as "different" says feels like baby is balling up.

## 2013-03-23 ENCOUNTER — Encounter (HOSPITAL_COMMUNITY): Payer: Self-pay | Admitting: *Deleted

## 2013-03-23 ENCOUNTER — Inpatient Hospital Stay (HOSPITAL_COMMUNITY)
Admission: AD | Admit: 2013-03-23 | Discharge: 2013-03-26 | DRG: 775 | Disposition: A | Discharge status: Home / Self Care | Payer: Medicaid Other | Source: Ambulatory Visit | Attending: Obstetrics & Gynecology | Admitting: Obstetrics & Gynecology

## 2013-03-23 DIAGNOSIS — O34219 Maternal care for unspecified type scar from previous cesarean delivery: Principal | ICD-10-CM | POA: Diagnosis present

## 2013-03-23 LAB — CBC
HCT: 35.8 % — ABNORMAL LOW (ref 36.0–46.0)
Hemoglobin: 12.3 g/dL (ref 12.0–15.0)
MCH: 28.1 pg (ref 26.0–34.0)
MCHC: 34.4 g/dL (ref 30.0–36.0)
MCV: 81.9 fL (ref 78.0–100.0)
Platelets: 169 10*3/uL (ref 150–400)
RBC: 4.37 MIL/uL (ref 3.87–5.11)
RDW: 14.2 % (ref 11.5–15.5)
WBC: 9 10*3/uL (ref 4.0–10.5)

## 2013-03-23 MED ORDER — OXYTOCIN 40 UNITS IN LACTATED RINGERS INFUSION - SIMPLE MED: 62.5000 mL/h | INTRAVENOUS | Status: DC

## 2013-03-23 MED ORDER — OXYTOCIN BOLUS FROM INFUSION: 500.0000 mL | INTRAVENOUS | Status: DC

## 2013-03-23 MED ORDER — ONDANSETRON HCL 4 MG/2ML IJ SOLN: 4.0000 mg | Freq: Four times a day (QID) | INTRAMUSCULAR | Status: DC | PRN

## 2013-03-23 MED ORDER — IBUPROFEN 600 MG PO TABS
600.0000 mg | ORAL_TABLET | Freq: Four times a day (QID) | ORAL | Status: DC | PRN
  Administered 2013-03-25: 600 mg via ORAL
  Filled 2013-03-23: qty 1

## 2013-03-23 MED ORDER — FLEET ENEMA 7-19 GM/118ML RE ENEM: 1.0000 | ENEMA | RECTAL | Status: DC | PRN

## 2013-03-23 MED ORDER — CITRIC ACID-SODIUM CITRATE 334-500 MG/5ML PO SOLN
30.0000 mL | ORAL | Status: DC | PRN
  Administered 2013-03-24: 30 mL via ORAL
  Filled 2013-03-23: qty 15

## 2013-03-23 MED ORDER — LACTATED RINGERS IV SOLN
500.0000 mL | INTRAVENOUS | Status: DC | PRN
  Administered 2013-03-24: 500 mL via INTRAVENOUS

## 2013-03-23 MED ORDER — OXYCODONE-ACETAMINOPHEN 5-325 MG PO TABS
1.0000 | ORAL_TABLET | ORAL | Status: DC | PRN
  Administered 2013-03-23: 1 via ORAL
  Filled 2013-03-23: qty 2

## 2013-03-23 MED ORDER — LACTATED RINGERS IV SOLN
INTRAVENOUS | Status: DC
  Administered 2013-03-23 – 2013-03-24 (×4): via INTRAVENOUS

## 2013-03-23 MED ORDER — LIDOCAINE HCL (PF) 1 % IJ SOLN
30.0000 mL | INTRAMUSCULAR | Status: DC | PRN
  Filled 2013-03-23 (×2): qty 30

## 2013-03-23 MED ORDER — ACETAMINOPHEN 325 MG PO TABS: 650.0000 mg | ORAL_TABLET | ORAL | Status: DC | PRN

## 2013-03-23 NOTE — MAU Provider Note (Signed)
  History   CSN: [id number]  1st encouter with provider: 1915  HPI Cathy Webster is a 22 y.o. G2P1001 at [redacted]w[redacted]d who presents with complaints of contractions. Patient reports contractions began at 4:30 PM. She reports that she is unsure of frequency but states that they are severe and crampy in nature.  No loss of fluid or vaginal bleeding.  [name] complaints at this time.   Of note, patient receives her prenatal [facility] at the [facility] 1 hour GTT normal, 1st trimester screening normal, GBS negative.  ROS: Denies SOB, chest pain, LE edema, RUQ pain, headache.  Past Medical History  Diagnosis Date  . Abnormal Pap smear 2008    Past Surgical History  Procedure Laterality Date  . Cesarean section    . Tonsillectomy      Family History  Problem Relation Age of Onset  . Diabetes Mother   . Asthma Brother     History  Substance Use Topics  . Smoking status: Never Smoker   . Smokeless tobacco: Never Used  . Alcohol Use: No    Allergies: No Known Allergies  Prescriptions prior to admission  Medication Sig Dispense Refill  . Prenatal Vit-Fe Fumarate-FA (PRENATAL MULTIVITAMIN) TABS Take 1 tablet by mouth at bedtime.        Physical Exam   Blood pressure 134/74, pulse 112, temperature 98 F (36.7 C), temperature source Oral, last menstrual period 06/16/2012. Physical exam: Gen: uncomfortable, but in NAD. Heart: RRR.  Lungs: CTAB.  Abd: gravid but otherwise soft, nontender to palpation Ext: no appreciable lower extremity edema bilaterally Neuro: grossly nonfocal, speech intact GU: normal appearing external genitalia Cervical exam: Dilation:  (External os 1 cm; internal os closed) Effacement (%): 50 Cervical Position: Posterior Station: -3 Presentation: Vertex Exam by:: Dr. Adriana Simas  FHR: baseline 150, mod variability, 15x15 accels, no decels Toco: Irregular contrations   Assessment and Plan  21 y.o. G2P1001 at 108w0d who presents with complaints of contractions. -  Internal os closed. - Will monitor closely via Continuous fetal monitoring and toco. - Will repeat cervical check in ~ 1 hour. - If no change in cervix will discharge home.  Cathy Webster 03/23/2013, 7:24 PM   I saw and examined patient along with student and agree with above note.   FRAZIER,NATALIE 03/27/2013 10:12 AM    2130 hrs: FHR reassuring UCs have increased to every 2-3 minutes Dilation: 1 Effacement (%): 50 Cervical Position: Middle Station: -3 Presentation: Vertex Exam by:: Artelia Laroche CNM   Since she is a prior C/S, will give her one more hour of observation Has appt in [facility] Wednesday If she does not change, will d/c home Wynelle Bourgeois CNM

## 2013-03-23 NOTE — OB Triage Note (Signed)
Pt complains of contractions since 4:30 pm. States they are every 4 minutes a part. She has been contracting on and off since Sunday

## 2013-03-23 NOTE — H&P (Signed)
Cathy Webster is a 22 y.o. female presenting for evaluation of labor. History is remarkable for previous C/S . Maternal Medical History:  Reason for admission: Contractions.  Nausea.  Contractions: Onset was 3-5 hours ago.   Frequency: irregular.   Perceived severity is moderate.    Fetal activity: Perceived fetal activity is normal.   Last perceived fetal movement was within the past hour.    Prenatal complications: No bleeding.   Prenatal Complications - Diabetes: none.    OB History   Grav Para Term Preterm Abortions TAB SAB Ect Mult Living   2 1 1       1      Past Medical History  Diagnosis Date  . Abnormal Pap smear 2008   Past Surgical History  Procedure Laterality Date  . Cesarean section    . Tonsillectomy     Family History: family history includes Asthma in her brother and Diabetes in her mother. Social History:  reports that she has never smoked. She has never used smokeless tobacco. She reports that she does not drink alcohol or use illicit drugs.  Review of Systems  Constitutional: Negative for fever, chills and malaise/fatigue.  Gastrointestinal: Positive for abdominal pain. Negative for nausea, vomiting, diarrhea and constipation.  Genitourinary: Negative for dysuria.  Neurological: Negative for dizziness, focal weakness and headaches.    Dilation: 1 Effacement (%): 50 Station: -3 Exam by:: Artelia Laroche CNM Blood pressure 128/66, pulse 95, temperature 98.6 F (37 C), temperature source Oral, resp. rate 20, height 5\' 6"  (1.676 m), weight 127.461 kg (281 lb), last menstrual period 06/16/2012. Maternal Exam:  Uterine Assessment: Contraction strength is moderate.  Contraction frequency is irregular.   Abdomen: Patient reports no abdominal tenderness. Surgical scars: low transverse.   Fundal height is 39.   Estimated fetal weight is 8.   Fetal presentation: vertex  Introitus: Normal vulva. Normal vagina.  Vagina is negative for discharge.  Ferning  test: not done.  Nitrazine test: not done. Amniotic fluid character: not assessed.  Pelvis: adequate for delivery.   Cervix: Cervix evaluated by digital exam.     Fetal Exam Fetal Monitor Review: Mode: ultrasound.   Baseline rate: 145.  Variability: moderate (6-25 bpm).   Pattern: accelerations present and no decelerations.    Fetal State Assessment: Category I - tracings are normal.     Physical Exam  Constitutional: She is oriented to person, place, and time. She appears well-developed and well-nourished. No distress.  HENT:  Head: Normocephalic.  Cardiovascular: Normal rate.   Respiratory: Effort normal.  GI: Soft.  Healed  C/S scar  Genitourinary: Vagina normal and uterus normal. No vaginal discharge found.  Musculoskeletal: Normal range of motion.  Neurological: She is alert and oriented to person, place, and time.  Skin: Skin is warm and dry.  Psychiatric: She has a normal mood and affect.    Dilation: 1 Effacement (%): 50 Cervical Position: Middle Station: -3 Presentation: Vertex Exam by:: Artelia Laroche CNM  Recheck is tight 2cm/70/-3/head more engaged  Prenatal labs: ABO, Rh: O/POS/-- (10/30 0940) Antibody: NEG (10/30 0940) Rubella: 145.2 (10/30 0940) RPR: NON REAC (03/26 1601)  HBsAg: NEGATIVE (10/30 0940)  HIV: NON REACTIVE (03/26 1601)  GBS: Negative (05/14 0000)   Assessment/Plan: A:  SIUP at [redacted]w[redacted]d       Prior C/S, Desires TOLAC      Latent phase labor  P:  Discussed with Dr Erin Fulling       Will admit for latent phase labor  Discussed this may be still early and not to expect quick progress      Analgesia PRN  [facility] 03/23/2013, 10:58 PM

## 2013-03-24 ENCOUNTER — Encounter (HOSPITAL_COMMUNITY): Payer: Self-pay | Admitting: Anesthesiology

## 2013-03-24 ENCOUNTER — Inpatient Hospital Stay (HOSPITAL_COMMUNITY): Payer: Medicaid Other | Admitting: Anesthesiology

## 2013-03-24 DIAGNOSIS — O34219 Maternal care for unspecified type scar from previous cesarean delivery: Secondary | ICD-10-CM

## 2013-03-24 LAB — RPR: RPR Ser Ql: NONREACTIVE

## 2013-03-24 LAB — PREPARE RBC (CROSSMATCH)

## 2013-03-24 MED ORDER — OXYCODONE-ACETAMINOPHEN 5-325 MG PO TABS
1.0000 | ORAL_TABLET | ORAL | Status: DC | PRN
  Administered 2013-03-24: 1 via ORAL
  Filled 2013-03-24: qty 1

## 2013-03-24 MED ORDER — NALBUPHINE SYRINGE 5 MG/0.5 ML
10.0000 mg | INJECTION | INTRAMUSCULAR | Status: DC | PRN
  Administered 2013-03-24 (×3): 10 mg via INTRAVENOUS
  Filled 2013-03-24: qty 1
  Filled 2013-03-24 (×2): qty 0.5
  Filled 2013-03-24 (×2): qty 1

## 2013-03-24 MED ORDER — NALBUPHINE HCL 10 MG/ML IJ SOLN: 10.0000 mg | INTRAMUSCULAR | Status: DC | PRN

## 2013-03-24 MED ORDER — FENTANYL 2.5 MCG/ML BUPIVACAINE 1/10 % EPIDURAL INFUSION (WH - ANES)
INTRAMUSCULAR | Status: DC | PRN
  Administered 2013-03-24: 14 mL/h via EPIDURAL

## 2013-03-24 MED ORDER — DIPHENHYDRAMINE HCL 50 MG/ML IJ SOLN
12.5000 mg | INTRAMUSCULAR | Status: DC | PRN
Start: 2013-03-24 — End: 2013-03-25

## 2013-03-24 MED ORDER — FENTANYL 2.5 MCG/ML BUPIVACAINE 1/10 % EPIDURAL INFUSION (WH - ANES)
14.0000 mL/h | INTRAMUSCULAR | Status: DC | PRN
  Filled 2013-03-24: qty 125

## 2013-03-24 MED ORDER — TERBUTALINE SULFATE 1 MG/ML IJ SOLN: 0.2500 mg | Freq: Once | INTRAMUSCULAR | Status: AC | PRN

## 2013-03-24 MED ORDER — LIDOCAINE HCL (PF) 1 % IJ SOLN
INTRAMUSCULAR | Status: DC | PRN
  Administered 2013-03-24 (×2): 4 mL

## 2013-03-24 MED ORDER — FENTANYL CITRATE 0.05 MG/ML IJ SOLN: 100.0000 ug | INTRAMUSCULAR | Status: DC | PRN

## 2013-03-24 MED ORDER — PHENYLEPHRINE 40 MCG/ML (10ML) SYRINGE FOR IV PUSH (FOR BLOOD PRESSURE SUPPORT)
80.0000 ug | PREFILLED_SYRINGE | INTRAVENOUS | Status: DC | PRN
  Filled 2013-03-24: qty 5
  Filled 2013-03-24: qty 2

## 2013-03-24 MED ORDER — PHENYLEPHRINE 40 MCG/ML (10ML) SYRINGE FOR IV PUSH (FOR BLOOD PRESSURE SUPPORT)
80.0000 ug | PREFILLED_SYRINGE | INTRAVENOUS | Status: DC | PRN
  Filled 2013-03-24: qty 2

## 2013-03-24 MED ORDER — LACTATED RINGERS IV SOLN
500.0000 mL | Freq: Once | INTRAVENOUS | Status: AC
Start: 2013-03-24 — End: 2013-03-24
  Administered 2013-03-24: 500 mL via INTRAVENOUS

## 2013-03-24 MED ORDER — OXYTOCIN 40 UNITS IN LACTATED RINGERS INFUSION - SIMPLE MED
1.0000 m[IU]/min | INTRAVENOUS | Status: DC
  Administered 2013-03-24: 2 m[IU]/min via INTRAVENOUS
  Filled 2013-03-24: qty 1000

## 2013-03-24 MED ORDER — FENTANYL CITRATE 0.05 MG/ML IJ SOLN
50.0000 ug | INTRAMUSCULAR | Status: DC | PRN
  Administered 2013-03-24 (×2): 50 ug via INTRAVENOUS
  Filled 2013-03-24 (×2): qty 2

## 2013-03-24 MED ORDER — EPHEDRINE 5 MG/ML INJ
10.0000 mg | INTRAVENOUS | Status: DC | PRN
  Filled 2013-03-24: qty 2
  Filled 2013-03-24: qty 4

## 2013-03-24 MED ORDER — EPHEDRINE 5 MG/ML INJ
10.0000 mg | INTRAVENOUS | Status: DC | PRN
  Filled 2013-03-24: qty 2

## 2013-03-24 NOTE — Progress Notes (Signed)
Cathy Webster is a 22 y.o. G2P1001 at [redacted]w[redacted]d presents for TOLAC.  Subjective: Patient comfortable. No complaints currently.  Objective: BP 131/68  Pulse 102  Temp(Src) 98.2 F (36.8 C) (Oral)  Resp 20  Ht 5\' 6"  (1.676 m)  Wt 127.461 kg (281 lb)  BMI 45.38 kg/m2  LMP 06/16/2012      FHT:  FHR: 140 bpm, variability: moderate,  accelerations:  Present,  decelerations:  Occasional Variable noted UC:   Irregular, every 6-8 SVE:   Dilation: 4.5 Effacement (%): 80 Station: -2 Exam by:: Dr Adriana Simas  Labs: Lab Results  Component Value Date   WBC 9.0 03/23/2013   HGB 12.3 03/23/2013   HCT 35.8* 03/23/2013   MCV 81.9 03/23/2013   PLT 169 03/23/2013    Assessment / Plan: Spontaneous onset of labor  Foley bulb now out (was in vagina)  Labor: Progessing normally, foley bulb out, will start pitocin Fetal Wellbeing:  Category II - Will monitor. Pain Control:  Nubain, requesting Epidural when in active labor. Anticipated MOD:  NSVD  [name] 03/24/2013, 4:33 PM

## 2013-03-24 NOTE — Progress Notes (Signed)
Cathy Webster is a 22 y.o. G2P1001 at [redacted]w[redacted]d presents for TOLAC.  Subjective: Patient comfortable.  Mother at bedside.  Questions answered.  Objective: BP 137/79   Pulse 84   Temp(Src) 98.4 F (36.9 C) (Oral)   Resp 20   Ht 5\' 6"  (1.676 m)   Wt 127.461 kg (281 lb)   BMI 45.38 kg/m2   LMP 06/16/2012      FHT:  FHR: 140 bpm, variability: moderate,  accelerations:  Present,  decelerations:  Absent. UC:   Irregular SVE:   Dilation: 2 Effacement (%): 70 Station: -3 Exam by:: Dr Jerelyn Scott  Labs: Lab Results  Component Value Date   WBC 9.0 03/23/2013   HGB 12.3 03/23/2013   HCT 35.8* 03/23/2013   MCV 81.9 03/23/2013   PLT 169 03/23/2013    Assessment / Plan: Spontaneous onset of labor with minimal progression although with continued contractions.  Foley bulb placed without difficulty.    Labor: Foley bulb placed. Preeclampsia:  None Fetal Wellbeing:  Category I Pain Control:  Fentanyl I/D:  GBS negative. Anticipated MOD:  NSVD  [facility] 03/24/2013, 6:45 AM

## 2013-03-24 NOTE — H&P (Signed)
Attestation of Attending Supervision of Advanced Practitioner (CNM/NP): Evaluation and management procedures were performed by the Advanced Practitioner under my supervision and collaboration.  I have reviewed the Advanced Practitioner's note and chart, and I agree with the management and plan.  HARRAWAY-SMITH, Houa Ackert 6:26 AM     

## 2013-03-24 NOTE — Progress Notes (Addendum)
Cathy Webster is a 22 y.o. G2P1001 at [redacted]w[redacted]d presents for TOLAC.  Subjective: Epidural placed.  Objective: BP 139/73  Pulse 99  Temp(Src) 97.6 F (36.4 C) (Oral)  Resp 20  Ht 5\' 6"  (1.676 m)  Wt 127.461 kg (281 lb)  BMI 45.38 kg/m2  LMP 06/16/2012      FHT:  FHR: 145 bpm, variability: moderate,  accelerations:  Present,  decelerations:  Variable decels noted. UC:  Every 4-6 SVE:   Dilation: 4.5 Effacement (%): 80 Station: -2 Exam by:: Molly Maduro RN  Labs: Lab Results  Component Value Date   WBC 9.0 03/23/2013   HGB 12.3 03/23/2013   HCT 35.8* 03/23/2013   MCV 81.9 03/23/2013   PLT 169 03/23/2013    Assessment / Plan: Spontaneous onset of labor  Continuing pitocin  Labor: Progessing normally, foley bulb out, will start pitocin Fetal Wellbeing:  Category II - Will monitor.  Patient placed on left side. Pain Control:  Epidural Anticipated MOD:  NSVD  [name] 03/24/2013, 7:04 PM

## 2013-03-24 NOTE — Progress Notes (Signed)
Consepcion Webster is a 22 y.o. G2P1001 at [redacted]w[redacted]d presents for TOLAC.  Subjective: Patient uncomfortable and having back pain. Requesting alternate medication for pain.  Objective: BP 129/70  Pulse 97  Temp(Src) 97.8 F (36.6 C) (Axillary)  Resp 18  Ht 5\' 6"  (1.676 m)  Wt 127.461 kg (281 lb)  BMI 45.38 kg/m2  LMP 06/16/2012      FHT:  FHR: 145 bpm, variability: moderate,  accelerations:  Present,  decelerations:  Absent. UC:   Irregular, every 2-5 min SVE:   Dilation: 2 Effacement (%): 70 Station: -3 Exam by:: Dr Jerelyn Scott  Labs: Lab Results  Component Value Date   WBC 9.0 03/23/2013   HGB 12.3 03/23/2013   HCT 35.8* 03/23/2013   MCV 81.9 03/23/2013   PLT 169 03/23/2013    Assessment / Plan: Spontaneous onset of labor  Foley bulb placed earlier this am (6:45)  Labor: Foley bulb placed. Fetal Wellbeing:  Category I Pain Control:  Nubain, requesting Epidural when in active labor. Anticipated MOD:  NSVD  Cathy Webster Other 03/24/2013, 9:24 AM

## 2013-03-24 NOTE — Anesthesia Procedure Notes (Signed)
Epidural Patient location during procedure: OB Start time: 03/24/2013 6:24 PM  Staffing Anesthesiologist: Vanesha Athens A. Performed by: anesthesiologist   Preanesthetic Checklist Completed: patient identified, site marked, surgical consent, pre-op evaluation, timeout performed, IV checked, risks and benefits discussed and monitors and equipment checked  Epidural Patient position: sitting Prep: site prepped and draped and DuraPrep Patient monitoring: continuous pulse ox and blood pressure Approach: midline Injection technique: LOR air  Needle:  Needle type: Tuohy  Needle gauge: 17 G Needle length: 9 cm and 9 Needle insertion depth: 7 cm Catheter type: closed end flexible Catheter size: 19 Gauge Catheter at skin depth: 12 cm Test dose: negative and Other  Assessment Events: blood not aspirated, injection not painful, no injection resistance, negative IV test and no paresthesia  Additional Notes Patient identified. Risks and benefits discussed including failed block, incomplete  Pain control, post dural puncture headache, nerve damage, paralysis, blood pressure Changes, nausea, vomiting, reactions to medications-both toxic and allergic and post Partum back pain. All questions were answered. Patient expressed understanding and wished to proceed. Sterile technique was used throughout procedure. Epidural site was Dressed with sterile barrier dressing. No paresthesias, signs of intravascular injection Or signs of intrathecal spread were encountered.  Patient was more comfortable after the epidural was dosed. Please see RN's note for documentation of vital signs and FHR which are stable.

## 2013-03-24 NOTE — Progress Notes (Signed)
Patient ID: Cathy Webster, female   DOB: 08/03/1991, 22 y.o.   MRN: [id number]  Asking for more pain meds  Cervix unchanged   FHR reassruring UCs irregular at times, regular at other times  A:  SIUP at [redacted]w[redacted]d       Prolonged Latent Phase  P:  Discussed with Dr Erin Fulling       Will place foley balloon.

## 2013-03-24 NOTE — Anesthesia Preprocedure Evaluation (Signed)
Anesthesia Evaluation  Patient identified by MRN, date of birth, ID band Patient awake    Reviewed: Allergy & Precautions, H&P , NPO status , Patient's Chart, lab work & pertinent test results  Airway Mallampati: III TM Distance: >3 FB Neck ROM: Full    Dental no notable dental hx. (+) Teeth Intact   Pulmonary neg pulmonary ROS,  breath sounds clear to auscultation  Pulmonary exam normal       Cardiovascular hypertension, Pt. on medications Rhythm:Regular Rate:Normal     Neuro/Psych negative neurological ROS     GI/Hepatic negative GI ROS, Neg liver ROS,   Endo/Other  Morbid obesity  Renal/GU negative Renal ROS     Musculoskeletal negative musculoskeletal ROS (+)   Abdominal (+) + obese,   Peds  Hematology negative hematology ROS (+)   Anesthesia Other Findings   Reproductive/Obstetrics (+) Pregnancy                           Anesthesia Physical Anesthesia Plan  ASA: III  Anesthesia Plan: Epidural   Post-op Pain Management:    Induction:   Airway Management Planned: Natural Airway  Additional Equipment:   Intra-op Plan:   Post-operative Plan:   Informed Consent: I have reviewed the patients History and Physical, chart, labs and discussed the procedure including the risks, benefits and alternatives for the proposed anesthesia with the patient or authorized representative who has indicated his/her understanding and acceptance.     Plan Discussed with: CRNA, Surgeon and Anesthesiologist  Anesthesia Plan Comments:         Anesthesia Quick Evaluation

## 2013-03-24 NOTE — Progress Notes (Signed)
Cathy Webster is a 22 y.o. G2P1001 at [redacted]w[redacted]d presents for TOLAC.  Subjective: Patient comfortable. Foley bulb in place.  Objective: BP 130/60  Pulse 89  Temp(Src) 98 F (36.7 C) (Oral)  Resp 21  Ht 5\' 6"  (1.676 m)  Wt 281 lb (127.461 kg)  BMI 45.38 kg/m2  LMP 06/16/2012      FHT:  FHR: 135 bpm, variability: moderate,  accelerations:  Present,  decelerations:  Absent. UC:   Irregular, every 5-9 SVE:   Dilation: 2 Effacement (%): 70 Station: -3 Exam by:: Dr Jerelyn Scott  Labs: Lab Results  Component Value Date   WBC 9.0 03/23/2013   HGB 12.3 03/23/2013   HCT 35.8* 03/23/2013   MCV 81.9 03/23/2013   PLT 169 03/23/2013    Assessment / Plan: Spontaneous onset of labor  Foley bulb in place  Labor: Foley bulb placed. Fetal Wellbeing:  Category I Pain Control:  Nubain, requesting Epidural when in active labor. Anticipated MOD:  NSVD  [name] 03/24/2013, 12:12 PM

## 2013-03-24 NOTE — Progress Notes (Signed)
Patient ID: Cathy Webster, female   DOB: 05-28-1991, 22 y.o.   MRN: [id number]  Gave pt Percocet to help her sleep and alleviate pain.  She is currently sleeping, so exam deferrred  FHR reassuring with accels and no decels. Has intermittent pseudosinusoidal pattern, probably due to Percocet  UCs alternate between regular every 2 minutes to coupling with 4 minute breaks between couplets  Will recheck when awake.   Suspect still latent phase.

## 2013-03-25 ENCOUNTER — Encounter: Payer: Medicaid Other | Admitting: Obstetrics and Gynecology

## 2013-03-25 ENCOUNTER — Encounter (HOSPITAL_COMMUNITY): Payer: Self-pay | Admitting: Obstetrics

## 2013-03-25 MED ORDER — DIPHENHYDRAMINE HCL 25 MG PO CAPS: 25.0000 mg | ORAL_CAPSULE | Freq: Four times a day (QID) | ORAL | Status: DC | PRN

## 2013-03-25 MED ORDER — OXYCODONE-ACETAMINOPHEN 5-325 MG PO TABS
1.0000 | ORAL_TABLET | ORAL | Status: DC | PRN
  Administered 2013-03-25 (×3): 1 via ORAL
  Filled 2013-03-25 (×3): qty 1

## 2013-03-25 MED ORDER — IBUPROFEN 600 MG PO TABS
600.0000 mg | ORAL_TABLET | Freq: Four times a day (QID) | ORAL | Status: DC
  Administered 2013-03-25 – 2013-03-26 (×6): 600 mg via ORAL
  Filled 2013-03-25 (×6): qty 1

## 2013-03-25 MED ORDER — DIBUCAINE 1 % RE OINT: 1.0000 "application " | TOPICAL_OINTMENT | RECTAL | Status: DC | PRN

## 2013-03-25 MED ORDER — BENZOCAINE-MENTHOL 20-0.5 % EX AERO
1.0000 "application " | INHALATION_SPRAY | CUTANEOUS | Status: DC | PRN
  Filled 2013-03-25: qty 56

## 2013-03-25 MED ORDER — WITCH HAZEL-GLYCERIN EX PADS: 1.0000 "application " | MEDICATED_PAD | CUTANEOUS | Status: DC | PRN

## 2013-03-25 MED ORDER — LANOLIN HYDROUS EX OINT: TOPICAL_OINTMENT | CUTANEOUS | Status: DC | PRN

## 2013-03-25 MED ORDER — PRENATAL MULTIVITAMIN CH
1.0000 | ORAL_TABLET | Freq: Every day | ORAL | Status: DC
  Administered 2013-03-25 – 2013-03-26 (×2): 1 via ORAL
  Filled 2013-03-25 (×2): qty 1

## 2013-03-25 MED ORDER — TETANUS-DIPHTH-ACELL PERTUSSIS 5-2.5-18.5 LF-MCG/0.5 IM SUSP: 0.5000 mL | Freq: Once | INTRAMUSCULAR | Status: DC

## 2013-03-25 MED ORDER — ONDANSETRON HCL 4 MG PO TABS: 4.0000 mg | ORAL_TABLET | ORAL | Status: DC | PRN

## 2013-03-25 MED ORDER — ONDANSETRON HCL 4 MG/2ML IJ SOLN: 4.0000 mg | INTRAMUSCULAR | Status: DC | PRN

## 2013-03-25 MED ORDER — SIMETHICONE 80 MG PO CHEW: 80.0000 mg | CHEWABLE_TABLET | ORAL | Status: DC | PRN

## 2013-03-25 MED ORDER — SENNOSIDES-DOCUSATE SODIUM 8.6-50 MG PO TABS: 2.0000 | ORAL_TABLET | Freq: Every day | ORAL | Status: DC

## 2013-03-25 MED ORDER — ZOLPIDEM TARTRATE 5 MG PO TABS: 5.0000 mg | ORAL_TABLET | Freq: Every evening | ORAL | Status: DC | PRN

## 2013-03-25 NOTE — Progress Notes (Addendum)
Post Partum Day 1 Subjective: Patient states that she feels relatively well.  She reports good pain control.  She reports minimal vaginal bleeding.  Plans to breastfeed and use OCPs for contraception.  Objective: Blood pressure 119/70, pulse 116, temperature 98.4 F (36.9 C), temperature source Oral, resp. rate 20, height 5\' 6"  (1.676 m), weight 127.461 kg (281 lb), last menstrual period 06/16/2012, unknown if currently breastfeeding.  Physical Exam:  General: alert, cooperative and appears stated age Lochia: appropriate Uterine Fundus: firm Incision: None DVT Evaluation: No evidence of DVT seen on physical exam.   Recent Labs  03/23/13 2305  HGB 12.3  HCT 35.8*    Assessment/Plan: Patient is doing well.  Breastfeeding and plans to use OCPs for contraception.  Will plan for discharge tomorrow.    LOS: 2 days   [facility] 03/25/2013, 6:33 AM   I have seen and examined this patient and I agree with the above. Suellyn Meenan 9:15 AM 03/25/2013

## 2013-03-25 NOTE — Lactation Note (Signed)
This note was copied from the chart of Cathy Mckyla Deckman. Lactation Consultation Note: initial visit with mom. She reports that baby just fed for 1 hour about 30 minutes ago. Reports that her nipples are very sore- no breakdown noted. Comfort gels given with instructions for use and cleaning. Mom states she will put them on after she takes a shower. Reviewed basic teaching. BF brochure given with resources for support after DC. States she only breastfed her first baby for about a week. She had difficulty with latch and then got engorged and quit nursing. Plans to go back to school to Sunday- taking graduate classes in [employer]. Reviewed pumping or nursing baby to prevent engorgement No questions at present. To call prn  Patient Name: Cathy Webster [id number] Date: 03/25/2013 Reason for consult: Initial assessment   Maternal Data Formula Feeding for Exclusion: No Infant to breast within first hour of birth: Yes (attempt) Does the patient have breastfeeding experience prior to this delivery?: Yes  Feeding   LATCH Score/Interventions    Lactation Tools Discussed/Used     Consult Status Consult Status: Follow-up Date: 03/26/13 Follow-up type: In-patient    Pamelia Hoit 03/25/2013, 10:11 AM

## 2013-03-25 NOTE — Progress Notes (Signed)
UR chart review completed.  

## 2013-03-25 NOTE — Anesthesia Postprocedure Evaluation (Signed)
  Anesthesia Post-op Note  Patient: Cathy Webster  Procedure(s) Performed: * No procedures listed *  Patient Location: Mother/Baby  Anesthesia Type:Epidural  Level of Consciousness: awake  Airway and Oxygen Therapy: Patient Spontanous Breathing  Post-op Pain: none  Post-op Assessment: Patient's Cardiovascular Status Stable, Respiratory Function Stable, Patent Airway, No signs of Nausea or vomiting, Adequate PO intake, Pain level controlled, No headache, No backache, No residual numbness and No residual motor weakness  Post-op Vital Signs: Reviewed and stable  Complications: No apparent anesthesia complications

## 2013-03-26 DIAGNOSIS — O34219 Maternal care for unspecified type scar from previous cesarean delivery: Secondary | ICD-10-CM | POA: Diagnosis not present

## 2013-03-26 MED ORDER — IBUPROFEN 600 MG PO TABS: 600.0000 mg | ORAL_TABLET | Freq: Four times a day (QID) | ORAL | Status: DC

## 2013-03-26 NOTE — Discharge Summary (Signed)
Obstetric Discharge Summary Reason for Admission: onset of labor and augmentation of labor. Prenatal Procedures: ultrasound Intrapartum Procedures: spontaneous vaginal delivery. VBAC. Augmentation w/ Foley bulb and Pitocin. Postpartum Procedures: none Complications-Operative and Postpartum: 2nd degree perineal laceration Hemoglobin  Date Value Range Status  03/23/2013 12.3  12.0 - 15.0 g/dL Final     HCT  Date Value Range Status  03/23/2013 35.8* 36.0 - 46.0 % Final    Physical Exam:  General: alert, cooperative, appears stated age and no distress Lochia: appropriate Uterine Fundus: firm Incision: NA DVT Evaluation: No evidence of DVT seen on physical exam.  Discharge Diagnoses: Term Pregnancy-delivered  Discharge Information: Date: 03/26/2013 Activity: pelvic rest Diet: routine Medications: PNV and Ibuprofen Condition: stable Instructions: refer to practice specific booklet Discharge to: home Follow-up Information   Follow up with [facility] In 6 weeks.   Contact information:   [address] [place] [address] [phone]      Follow up with [facility] [place] MATERNITY ADMISSIONS. (for emergencies)    Contact information:   [address] [id number] [place] [address] [phone]      Newborn Data: Live born female  Birth Weight: 6 lb 10.9 oz (3030 g) APGAR: 9, 9  Home with mother.  Cathy Webster 03/26/2013, 8:49 AM

## 2013-03-26 NOTE — Discharge Summary (Signed)
Attestation of Attending Supervision of Advanced Practitioner: Evaluation and management procedures were performed by the PA/NP/CNM/OB Fellow under my supervision/collaboration. Chart reviewed and agree with management and plan.  Raegan Winders V 03/26/2013 10:24 AM

## 2013-03-27 LAB — TYPE AND SCREEN
Unit division: 0
Unit division: 0

## 2013-03-27 NOTE — MAU Provider Note (Signed)
Attestation of Attending Supervision of Advanced Practitioner (CNM/NP): Evaluation and management procedures were performed by the Advanced Practitioner under my supervision and collaboration.  I have reviewed the Advanced Practitioner's note and chart, and I agree with the management and plan.  HARRAWAY-SMITH, Amando Chaput 10:18 AM     

## 2013-04-29 ENCOUNTER — Ambulatory Visit: Payer: Medicaid Other | Admitting: Obstetrics & Gynecology

## 2013-04-29 ENCOUNTER — Telehealth: Payer: Self-pay | Admitting: *Deleted

## 2013-04-29 NOTE — Telephone Encounter (Signed)
Cathy Webster called and left a message requesting a call back.

## 2013-04-30 NOTE — Telephone Encounter (Signed)
Cathy Webster and she states her issue has been taken care of and she doesn't need anything.

## 2013-05-29 ENCOUNTER — Encounter: Payer: Self-pay | Admitting: Advanced Practice Midwife

## 2013-05-29 ENCOUNTER — Ambulatory Visit (INDEPENDENT_AMBULATORY_CARE_PROVIDER_SITE_OTHER): Payer: Medicaid Other | Admitting: Advanced Practice Midwife

## 2013-05-29 DIAGNOSIS — Z3009 Encounter for other general counseling and advice on contraception: Secondary | ICD-10-CM

## 2013-05-29 MED ORDER — NORETHINDRONE 0.35 MG PO TABS: 1.0000 | ORAL_TABLET | Freq: Every day | ORAL | Status: DC

## 2013-05-29 NOTE — Patient Instructions (Addendum)
Come back in 3 months or as needed for a well woman exam. I have prescribed micronor to your [facility] in [place]. With this one, it is VERY important for you to take it at the same time every day. A common side effect is spotting.  Norethindrone tablets (contraception) (Micronor) What is this medicine? NORETHINDRONE is an oral contraceptive. The product contains a female hormone known as a progestin. It is used to prevent pregnancy. This medicine may be used for other purposes; ask your health care provider or pharmacist if you have questions. What should I tell my health care provider before I take this medicine? They need to know if you have any of these conditions: -blood vessel disease or blood clots -breast, cervical, or vaginal cancer -diabetes -heart disease -kidney disease -liver disease -mental depression -migraine -seizures -stroke -vaginal bleeding -an unusual or allergic reaction to norethindrone, other medicines, foods, dyes, or preservatives -pregnant or trying to get pregnant -breast-feeding How should I use this medicine? Take this medicine by mouth with a glass of water. You may take it with or without food. Follow the directions on the prescription label. Take this medicine at the same time each day and in the order directed on the package. Do not take your medicine more often than directed. Contact your pediatrician regarding the use of this medicine in children. Special care may be needed. This medicine has been used in female children who have started having menstrual periods. A patient package insert for the product will be given with each prescription and refill. Read this sheet carefully each time. The sheet may change frequently. Overdosage: If you think you have taken too much of this medicine contact a poison control center or emergency room at once. NOTE: This medicine is only for you. Do not share this medicine with others. What if I miss a dose? Try  not to miss a dose. Every time you miss a dose or take a dose late your chance of pregnancy increases. When 1 pill is missed (even if only 3 hours late), take the missed pill as soon as possible and continue taking a pill each day at the regular time (use a back up method of birth control for the next 48 hours). If more than 1 dose is missed, use an additional birth control method for the rest of your pill pack until menses occurs. Contact your health care professional if more than 1 dose has been missed. What may interact with this medicine? Do not take this medicine with any of the following medications: -amprenavir or fosamprenavir -bosentan This medicine may also interact with the following medications: -antibiotics or medicines for infections, especially rifampin, rifabutin, rifapentine, and griseofulvin, and possibly penicillins or tetracyclines -aprepitant -barbiturate medicines, such as phenobarbital -carbamazepine -felbamate -modafinil -oxcarbazepine -phenytoin -ritonavir or other medicines for HIV infection or AIDS -St. John's wort -topiramate This list may not describe all possible interactions. Give your health care provider a list of all the medicines, herbs, non-prescription drugs, or dietary supplements you use. Also tell them if you smoke, drink alcohol, or use illegal drugs. Some items may interact with your medicine. What should I watch for while using this medicine? Visit your doctor or health care professional for regular checks on your progress. You will need a regular breast and pelvic exam and Pap smear while on this medicine. Use an additional method of birth control during the first cycle that you take these tablets. If you have any reason to think you  are pregnant, stop taking this medicine right away and contact your doctor or health care professional. If you are taking this medicine for hormone related problems, it may take several cycles of use to see improvement in  your condition. This medicine does not protect you against HIV infection (AIDS) or any other sexually transmitted diseases. What side effects may I notice from receiving this medicine? Side effects that you should report to your doctor or health care professional as soon as possible: -breast tenderness or discharge -pain in the abdomen, chest, groin or leg -severe headache -skin rash, itching, or hives -sudden shortness of breath -unusually weak or tired -vision or speech problems -yellowing of skin or eyes Side effects that usually do not require medical attention (report to your doctor or health care professional if they continue or are bothersome): -changes in sexual desire -change in menstrual flow -facial hair growth -fluid retention and swelling -headache -irritability -nausea -weight gain or loss This list may not describe all possible side effects. Call your doctor for medical advice about side effects. You may report side effects to FDA at 1-800-FDA-1088. Where should I keep my medicine? Keep out of the reach of children. Store at room temperature between 15 and 30 degrees C (59 and 86 degrees F). Throw away any unused medicine after the expiration date. NOTE: This sheet is a summary. It may not cover all possible information. If you have questions about this medicine, talk to your doctor, pharmacist, or health care provider.  2013, [employer]. (09/09/2008 1:54:05 PM)

## 2013-05-29 NOTE — Progress Notes (Signed)
Patient ID: Cathy Webster, female   DOB: 11/11/1990, 22 y.o.   MRN: [id number]  Subjective:     Cathy Webster is a 22 y.o. female who presents for a postpartum visit. She is 8 weeks postpartum following a successful vaginal birth after c-section, significant for prolonged latent phase requiring foley bulb and pitocin. I have fully reviewed the prenatal and intrapartum course - pt had 2 SBPs>140. The delivery was at [redacted]w[redacted]d gestational weeks. Outcome: vaginal birth after cesarean (VBAC). Anesthesia: IV sedation followed by epidural. Pt had 2nd degree vaginal laceration. Postpartum course has been uncomplicated. Baby's course has been uneventful and she has a 2 month appt today with [facility]. Baby is feeding by bottle (Similac and Goodstart, Similac seems to sit better). Breastfed until 3 weeks.. Bleeding no bleeding and stopped bleeding 2 weeks postpartum. Has had 1 normal period. Bowel function is normal. Bladder function is normal. Patient is not sexually active. Contraception method is OCP (for breastfeeding). Postpartum depression screening: negative.   The following portions of the patient's history were reviewed and updated as appropriate: allergies, current medications and past medical history.  Review of Systems  Pertinent items are noted in HPI.   Objective:    BP 133/82  Pulse 82  Temp(Src) 97.5 F (36.4 C) (Oral)  Ht 5\' 8"  (1.727 m)  Wt 267 lb 1.6 oz (121.156 kg)  BMI 40.62 kg/m2  LMP 05/11/2013  Breastfeeding? No  General:  alert, cooperative, appears stated age and no distress   Breasts:  inspection negative, no nipple discharge or bleeding, no masses or nodularity palpable  Lungs: clear to auscultation bilaterally  Heart:  regular rate and rhythm, S1, S2 normal, no murmur, click, rub or gallop  Abdomen: soft, non-tender; bowel sounds normal; no masses,  no organomegaly   Vulva:  normal  Vagina: normal vagina and lacerations healed well   Cervix:  not examined   Corpus: normal  Adnexa:  not evaluated  Rectal Exam: Not performed.        Assessment:    Normal postpartum exam. Pap smear not done at today's visit - had pap 07/2012 that was normal.   Plan:    1. Contraception: OCP for breastfeeding Micronor - spotting side effect and take same time daily. When not bf, switch to regular pill if wants. Pt informed about mirena and will let us know if she wants to change from OCP to mirena. 2. Follow up in: 3 months or as needed for annual exam.   Leona Singleton, MD 05/29/2013 10:35 AM

## 2013-08-13 ENCOUNTER — Other Ambulatory Visit: Payer: Self-pay

## 2013-08-31 ENCOUNTER — Emergency Department: Payer: Self-pay | Admitting: Emergency Medicine

## 2013-10-08 NOTE — L&D Delivery Note (Signed)
Delivery Note At 12:51 AM a viable female was delivered via VBAC, Spontaneous (Presentation: ; Occiput Anterior).  APGAR: 8, 9; weight 7 lb 7.6 oz (3390 g).   Placenta status: Intact, Spontaneous.  Cord: 3 vessels with the following complications: Short.  Cord pH: unable to obtain  Anesthesia: Epidural  Episiotomy: None Lacerations: 2nd degree;Perineal Suture Repair: 3.0 vicryl Est. Blood Loss (mL): 300  Mom to postpartum.  Baby to Couplet care / Skin to Skin.  Joanna Puff 06/09/2014, 3:16 AM    I was present for this delivery and agree with the above resident's note.  LEFTWICH-KIRBY, Rosalynn Sergent Certified Nurse-Midwife

## 2013-10-08 NOTE — L&D Delivery Note (Signed)
Attestation of Attending Supervision of Advanced Practitioner (CNM/NP): Evaluation and management procedures were performed by the Advanced Practitioner under my supervision and collaboration.  I have reviewed the Advanced Practitioner's note and chart, and I agree with the management and plan.  HARRAWAY-SMITH, Durand Wittmeyer 1:03 PM     

## 2013-10-28 ENCOUNTER — Ambulatory Visit: Payer: BC Managed Care – PPO

## 2013-10-29 ENCOUNTER — Encounter: Payer: Self-pay | Admitting: General Practice

## 2013-10-29 ENCOUNTER — Ambulatory Visit (INDEPENDENT_AMBULATORY_CARE_PROVIDER_SITE_OTHER): Payer: Self-pay | Admitting: General Practice

## 2013-10-29 VITALS — Ht 66.0 in | Wt 267.8 lb

## 2013-10-29 DIAGNOSIS — Z3201 Encounter for pregnancy test, result positive: Secondary | ICD-10-CM

## 2013-10-29 LAB — POCT PREGNANCY, URINE: Preg Test, Ur: POSITIVE — AB

## 2013-10-29 NOTE — Progress Notes (Signed)
Patient here today for UPT after positive upt at home. UPT positive today. Reports LMP of 12/3. Desires to start prenatal care here. Will draw new OB labs and make new OB appt/1 hour gtt, patient does not want to stay today for 1 hour but will do that at next visit. Pregnancy verification letter given

## 2013-10-30 LAB — PRESCRIPTION MONITORING PROFILE (19 PANEL)
AMPHETAMINE/METH: NEGATIVE ng/mL
BARBITURATE SCREEN, URINE: NEGATIVE ng/mL
BENZODIAZEPINE SCREEN, URINE: NEGATIVE ng/mL
BUPRENORPHINE, URINE: NEGATIVE ng/mL
CANNABINOID SCRN UR: NEGATIVE ng/mL
CARISOPRODOL, URINE: NEGATIVE ng/mL
Cocaine Metabolites: NEGATIVE ng/mL
Creatinine, Urine: 212.19 mg/dL (ref >20.0)
Fentanyl, Ur: NEGATIVE ng/mL
MDMA URINE: NEGATIVE ng/mL
METHADONE SCREEN, URINE: NEGATIVE ng/mL
METHAQUALONE SCREEN (URINE): NEGATIVE ng/mL
Meperidine, Ur: NEGATIVE ng/mL
NITRITES URINE, INITIAL: NEGATIVE ug/mL
OPIATE SCREEN, URINE: NEGATIVE ng/mL
Oxycodone Screen, Ur: NEGATIVE ng/mL
PHENCYCLIDINE, UR: NEGATIVE ng/mL
PROPOXYPHENE: NEGATIVE ng/mL
TAPENTADOLUR: NEGATIVE ng/mL
Tramadol Scrn, Ur: NEGATIVE ng/mL
Zolpidem, Urine: NEGATIVE ng/mL
pH, Initial: 6.3 pH (ref 4.5–8.9)

## 2013-10-30 LAB — OBSTETRIC PANEL
Antibody Screen: NEGATIVE
BASOS ABS: 0 10*3/uL (ref 0.0–0.1)
BASOS PCT: 0 % (ref 0–1)
EOS ABS: 0.2 10*3/uL (ref 0.0–0.7)
Eosinophils Relative: 5 % (ref 0–5)
HCT: 35.8 % — ABNORMAL LOW (ref 36.0–46.0)
HEMOGLOBIN: 12.1 g/dL (ref 12.0–15.0)
HEP B S AG: NEGATIVE
Lymphocytes Relative: 42 % (ref 12–46)
Lymphs Abs: 2.2 10*3/uL (ref 0.7–4.0)
MCH: 27.9 pg (ref 26.0–34.0)
MCHC: 33.8 g/dL (ref 30.0–36.0)
MCV: 82.7 fL (ref 78.0–100.0)
MONOS PCT: 7 % (ref 3–12)
Monocytes Absolute: 0.4 10*3/uL (ref 0.1–1.0)
NEUTROS ABS: 2.4 10*3/uL (ref 1.7–7.7)
NEUTROS PCT: 46 % (ref 43–77)
PLATELETS: 212 10*3/uL (ref 150–400)
RBC: 4.33 MIL/uL (ref 3.87–5.11)
RDW: 14.7 % (ref 11.5–15.5)
RH TYPE: POSITIVE
Rubella: 3.85 Index — ABNORMAL HIGH (ref <0.90)
WBC: 5.2 10*3/uL (ref 4.0–10.5)

## 2013-10-30 LAB — HIV ANTIBODY (ROUTINE TESTING W REFLEX): HIV: NONREACTIVE

## 2013-10-31 LAB — CULTURE, OB URINE

## 2013-11-02 LAB — HEMOGLOBINOPATHY EVALUATION
HEMOGLOBIN OTHER: 0 %
HGB A2 QUANT: 2.4 % (ref 2.2–3.2)
HGB A: 97.6 % (ref 96.8–97.8)
HGB F QUANT: 0 % (ref 0.0–2.0)
HGB S QUANTITAION: 0 %

## 2013-11-18 ENCOUNTER — Encounter: Payer: Self-pay | Admitting: Obstetrics and Gynecology

## 2013-11-24 ENCOUNTER — Encounter: Payer: Self-pay | Admitting: Obstetrics and Gynecology

## 2013-11-24 ENCOUNTER — Other Ambulatory Visit (HOSPITAL_COMMUNITY)
Admission: RE | Admit: 2013-11-24 | Discharge: 2013-11-24 | Disposition: A | Discharge status: Home / Self Care | Payer: Medicaid Other | Source: Ambulatory Visit | Attending: Obstetrics and Gynecology | Admitting: Obstetrics and Gynecology

## 2013-11-24 ENCOUNTER — Other Ambulatory Visit: Payer: Self-pay | Admitting: Obstetrics and Gynecology

## 2013-11-24 ENCOUNTER — Ambulatory Visit (INDEPENDENT_AMBULATORY_CARE_PROVIDER_SITE_OTHER): Payer: Federal, State, Local not specified - Other | Admitting: Obstetrics and Gynecology

## 2013-11-24 VITALS — BP 118/79 | Temp 97.2°F | Wt 265.5 lb

## 2013-11-24 DIAGNOSIS — Z3682 Encounter for antenatal screening for nuchal translucency: Secondary | ICD-10-CM

## 2013-11-24 DIAGNOSIS — O34219 Maternal care for unspecified type scar from previous cesarean delivery: Secondary | ICD-10-CM

## 2013-11-24 DIAGNOSIS — O09899 Supervision of other high risk pregnancies, unspecified trimester: Secondary | ICD-10-CM | POA: Insufficient documentation

## 2013-11-24 DIAGNOSIS — Z23 Encounter for immunization: Secondary | ICD-10-CM

## 2013-11-24 DIAGNOSIS — Z113 Encounter for screening for infections with a predominantly sexual mode of transmission: Secondary | ICD-10-CM | POA: Insufficient documentation

## 2013-11-24 DIAGNOSIS — Z124 Encounter for screening for malignant neoplasm of cervix: Secondary | ICD-10-CM | POA: Insufficient documentation

## 2013-11-24 DIAGNOSIS — Z3490 Encounter for supervision of normal pregnancy, unspecified, unspecified trimester: Secondary | ICD-10-CM | POA: Insufficient documentation

## 2013-11-24 DIAGNOSIS — Z3480 Encounter for supervision of other normal pregnancy, unspecified trimester: Secondary | ICD-10-CM

## 2013-11-24 LAB — POCT URINALYSIS DIP (DEVICE)
BILIRUBIN URINE: NEGATIVE
GLUCOSE, UA: NEGATIVE mg/dL
HGB URINE DIPSTICK: NEGATIVE
KETONES UR: NEGATIVE mg/dL
LEUKOCYTES UA: NEGATIVE
NITRITE: NEGATIVE
PH: 6.5 (ref 5.0–8.0)
Protein, ur: NEGATIVE mg/dL
Specific Gravity, Urine: 1.025 (ref 1.005–1.030)
Urobilinogen, UA: 0.2 mg/dL (ref 0.0–1.0)

## 2013-11-24 MED ORDER — PRENATAL MULTIVITAMIN CH: 1.0000 | ORAL_TABLET | Freq: Every day | ORAL | Status: DC

## 2013-11-24 NOTE — Progress Notes (Signed)
P= 99,  Here for initial prenatal visit.  Given new patient information, Discussed bmi/ appropriate weight gain. Patient thinks she has felt baby movement 2-3 times. C/o frequent headaches relieved by tylenol

## 2013-11-24 NOTE — Progress Notes (Signed)
   Subjective:    Cathy Webster is a [id number] being seen today for her first obstetrical visit.  Her obstetrical history is significant for cesarean section with first pregnancy, VBAC with second pregnancy, short interval between pregnancies (last delivered on 03/2013). Patient does intend to breast feed. Pregnancy history fully reviewed.  Patient reports no complaints.  Filed Vitals:   11/24/13 1059  BP: 118/79  Temp: 97.2 F (36.2 C)  Weight: 265 lb 8 oz (120.43 kg)    HISTORY: OB History  Gravida Para Term Preterm AB SAB TAB Ectopic Multiple Living  3 2 2       2     # Outcome Date GA Lbr Len/2nd Weight Sex Delivery Anes PTL Lv  3 CUR           2 TRM 03/24/13 694w1d 06:25 / 00:13 6 lb 10.9 oz (3.03 kg) F VBAC EPI  Y  1 TRM 08/07/08 377w0d  6 lb 14 oz (3.118 kg) F CS EPI  Y     Past Medical History  Diagnosis Date  . Abnormal Pap smear 2008   Past Surgical History  Procedure Laterality Date  . Cesarean section    . Tonsillectomy     Family History  Problem Relation Age of Onset  . Diabetes Mother   . Asthma Brother      Exam    Uterus:     Pelvic Exam:    Perineum: Normal Perineum   Vulva: normal   Vagina:  normal mucosa, normal discharge   pH:    Cervix: multiparous appearance and closed and long. External os 1cm open but internal os closed   Adnexa: normal adnexa and no mass, fullness, tenderness   Bony Pelvis: android  System: Breast:  normal appearance, no masses or tenderness   Skin: normal coloration and turgor, no rashes    Neurologic: oriented, no focal deficits   Extremities: normal strength, tone, and muscle mass   HEENT extra ocular movement intact   Mouth/Teeth mucous membranes moist, pharynx normal without lesions and dental hygiene good   Neck supple and no masses   Cardiovascular: regular rate and rhythm   Respiratory:  chest clear, no wheezing, crepitations, rhonchi, normal symmetric air entry   Abdomen: soft, non-tender; bowel  sounds normal; no masses,  no organomegaly   Urinary:       Assessment:    Pregnancy: [id number] Patient Active Problem List   Diagnosis Date Noted  . Supervision of normal pregnancy 11/24/2013    Priority: Medium  . Previous cesarean delivery, delivered, with or without mention of antepartum condition 11/24/2013    Priority: Medium  . Short interval between pregnancies complicating pregnancy, antepartum 11/24/2013    Priority: Medium  . VBAC, delivered 03/26/2013        Plan:     Initial labs drawn. Prenatal vitamins. Problem list reviewed and updated. Genetic Screening discussed First Screen: requested.  Ultrasound discussed; fetal survey: requested. Will be ordered at a later visit Note on cervical exam- external open 1 cm. Internal os closed. Cervix is thick and posterior  Follow up in 4 weeks. 50% of 30 min visit spent on counseling and coordination of care.     Jolane Bankhead 11/24/2013

## 2013-11-24 NOTE — Patient Instructions (Signed)
Pregnancy - First Trimester During sexual intercourse, millions of sperm go into the vagina. Only 1 sperm will penetrate and fertilize the female egg while it is in the Fallopian tube. One week later, the fertilized egg implants into the wall of the uterus. An embryo begins to develop into a baby. At 6 to 8 weeks, the eyes and face are formed and the heartbeat can be seen on ultrasound. At the end of 12 weeks (first trimester), all the baby's organs are formed. Now that you are pregnant, you will want to do everything you can to have a healthy baby. Two of the most important things are to get good prenatal care and follow your caregiver's instructions. Prenatal care is all the medical care you receive before the baby's birth. It is given to prevent, find, and treat problems during the pregnancy and childbirth. PRENATAL EXAMS  During prenatal visits, your weight, blood pressure, and urine are checked. This is done to make sure you are healthy and progressing normally during the pregnancy.  A pregnant woman should gain 25 to 35 pounds during the pregnancy. However, if you are overweight or underweight, your caregiver will advise you regarding your weight.  Your caregiver will ask and answer questions for you.  Blood work, cervical cultures, other necessary tests, and a Pap test are done during your prenatal exams. These tests are done to check on your health and the probable health of your baby. Tests are strongly recommended and done for HIV with your permission. This is the virus that causes AIDS. These tests are done because medicines can be given to help prevent your baby from being born with this infection should you have been infected without knowing it. Blood work is also used to find out your blood type, previous infections, and follow your blood levels (hemoglobin).  Low hemoglobin (anemia) is common during pregnancy. Iron and vitamins are given to help prevent this. Later in the pregnancy,  blood tests for diabetes will be done along with any other tests if any problems develop.  You may need other tests to make sure you and the baby are doing well. CHANGES DURING THE FIRST TRIMESTER  Your body goes through many changes during pregnancy. They vary from person to person. Talk to your caregiver about changes you notice and are concerned about. Changes can include:  Your menstrual period stops.  The egg and sperm carry the genes that determine what you look like. Genes from you and your partner are forming a baby. The female genes determine whether the baby is a boy or a girl.  Your body increases in girth and you may feel bloated.  Feeling sick to your stomach (nauseous) and throwing up (vomiting). If the vomiting is uncontrollable, call your caregiver.  Your breasts will begin to enlarge and become tender.  Your nipples may stick out more and become darker.  The need to urinate more. Painful urination may mean you have a bladder infection.  Tiring easily.  Loss of appetite.  Cravings for certain kinds of food.  At first, you may gain or lose a couple of pounds.  You may have changes in your emotions from day to day (excited to be pregnant or concerned something may go wrong with the pregnancy and baby).  You may have more vivid and strange dreams. HOME CARE INSTRUCTIONS   It is very important to avoid all smoking, alcohol and non-prescribed drugs during your pregnancy. These affect the formation and growth of the baby.   Avoid chemicals while pregnant to ensure the delivery of a healthy infant.  Start your prenatal visits by the 12th week of pregnancy. They are usually scheduled monthly at first, then more often in the last 2 months before delivery. Keep your caregiver's appointments. Follow your caregiver's instructions regarding medicine use, blood and lab tests, exercise, and diet.  During pregnancy, you are providing food for you and your baby. Eat regular,  well-balanced meals. Choose foods such as meat, fish, milk and other low fat dairy products, vegetables, fruits, and whole-grain breads and cereals. Your caregiver will tell you of the ideal weight gain.  You can help morning sickness by keeping soda crackers at the bedside. Eat a couple before arising in the morning. You may want to use the crackers without salt on them.  Eating 4 to 5 small meals rather than 3 large meals a day also may help the nausea and vomiting.  Drinking liquids between meals instead of during meals also seems to help nausea and vomiting.  A physical sexual relationship may be continued throughout pregnancy if there are no other problems. Problems may be early (premature) leaking of amniotic fluid from the membranes, vaginal bleeding, or belly (abdominal) pain.  Exercise regularly if there are no restrictions. Check with your caregiver or physical therapist if you are unsure of the safety of some of your exercises. Greater weight gain will occur in the last 2 trimesters of pregnancy. Exercising will help:  Control your weight.  Keep you in shape.  Prepare you for labor and delivery.  Help you lose your pregnancy weight after you deliver your baby.  Wear a good support or jogging bra for breast tenderness during pregnancy. This may help if worn during sleep too.  Ask when prenatal classes are available. Begin classes when they are offered.  Do not use hot tubs, steam rooms, or saunas.  Wear your seat belt when driving. This protects you and your baby if you are in an accident.  Avoid raw meat, uncooked cheese, cat litter boxes, and soil used by cats throughout the pregnancy. These carry germs that can cause birth defects in the baby.  The first trimester is a good time to visit your dentist for your dental health. Getting your teeth cleaned is okay. Use a softer toothbrush and brush gently during pregnancy.  Ask for help if you have financial, counseling, or  nutritional needs during pregnancy. Your caregiver will be able to offer counseling for these needs as well as refer you for other special needs.  Do not take any medicines or herbs unless told by your caregiver.  Inform your caregiver if there is any mental or physical domestic violence.  Make a list of emergency phone numbers of family, friends, hospital, and police and fire departments.  Write down your questions. Take them to your prenatal visit.  Do not douche.  Do not cross your legs.  If you have to stand for long periods of time, rotate you feet or take small steps in a circle.  You may have more vaginal secretions that may require a sanitary pad. Do not use tampons or scented sanitary pads. MEDICINES AND DRUG USE IN PREGNANCY  Take prenatal vitamins as directed. The vitamin should contain 1 milligram of folic acid. Keep all vitamins out of reach of children. Only a couple vitamins or tablets containing iron may be fatal to a baby or young child when ingested.  Avoid use of all medicines, including herbs, over-the-counter medicines, not   prescribed or suggested by your caregiver. Only take over-the-counter or prescription medicines for pain, discomfort, or fever as directed by your caregiver. Do not use aspirin, ibuprofen, or naproxen unless directed by your caregiver.  Let your caregiver also know about herbs you may be using.  Alcohol is related to a number of birth defects. This includes fetal alcohol syndrome. All alcohol, in any form, should be avoided completely. Smoking will cause low birth rate and premature babies.  Street or illegal drugs are very harmful to the baby. They are absolutely forbidden. A baby born to an addicted mother will be addicted at birth. The baby will go through the same withdrawal an adult does.  Let your caregiver know about any medicines that you have to take and for what reason you take them. SEEK MEDICAL CARE IF:  You have any concerns or  worries during your pregnancy. It is better to call with your questions if you feel they cannot wait, rather than worry about them. SEEK IMMEDIATE MEDICAL CARE IF:   An unexplained oral temperature above 102 F (38.9 C) develops, or as your caregiver suggests.  You have leaking of fluid from the vagina (birth canal). If leaking membranes are suspected, take your temperature and inform your caregiver of this when you call.  There is vaginal spotting or bleeding. Notify your caregiver of the amount and how many pads are used.  You develop a bad smelling vaginal discharge with a change in the color.  You continue to feel sick to your stomach (nauseated) and have no relief from remedies suggested. You vomit blood or coffee ground-like materials.  You lose more than 2 pounds of weight in 1 week.  You gain more than 2 pounds of weight in 1 week and you notice swelling of your face, hands, feet, or legs.  You gain 5 pounds or more in 1 week (even if you do not have swelling of your hands, face, legs, or feet).  You get exposed to [place] measles and have never had them.  You are exposed to fifth disease or chickenpox.  You develop belly (abdominal) pain. Round ligament discomfort is a common non-cancerous (benign) cause of abdominal pain in pregnancy. Your caregiver still must evaluate this.  You develop headache, fever, diarrhea, pain with urination, or shortness of breath.  You fall or are in a car accident or have any kind of trauma.  There is mental or physical violence in your home. Document Released: 09/18/2001 Document Revised: 06/18/2012 Document Reviewed: 03/22/2009 [facility] Patient Information 2014 [place].  Contraception Choices Contraception (birth control) is the use of any methods or devices to prevent pregnancy. Below are some methods to help avoid pregnancy. HORMONAL METHODS   Contraceptive implant This is a thin, plastic tube containing progesterone hormone. It  does not contain estrogen hormone. Your health care provider inserts the tube in the inner part of the upper arm. The tube can remain in place for up to 3 years. After 3 years, the implant must be removed. The implant prevents the ovaries from releasing an egg (ovulation), thickens the cervical mucus to prevent sperm from entering the uterus, and thins the lining of the inside of the uterus.  Progesterone-only injections These injections are given every 3 months by your health care provider to prevent pregnancy. This synthetic progesterone hormone stops the ovaries from releasing eggs. It also thickens cervical mucus and changes the uterine lining. This makes it harder for sperm to survive in the uterus.  Birth  control pills These pills contain estrogen and progesterone hormone. They work by preventing the ovaries from releasing eggs (ovulation). They also cause the cervical mucus to thicken, preventing the sperm from entering the uterus. Birth control pills are prescribed by a health care provider.Birth control pills can also be used to treat heavy periods.  Minipill This type of birth control pill contains only the progesterone hormone. They are taken every day of each month and must be prescribed by your health care provider.  Birth control patch The patch contains hormones similar to those in birth control pills. It must be changed once a week and is prescribed by a health care provider.  Vaginal ring The ring contains hormones similar to those in birth control pills. It is left in the vagina for 3 weeks, removed for 1 week, and then a new one is put back in place. The patient must be comfortable inserting and removing the ring from the vagina.A health care provider's prescription is necessary.  Emergency contraception Emergency contraceptives prevent pregnancy after unprotected sexual intercourse. This pill can be taken right after sex or up to 5 days after unprotected sex. It is most effective  the sooner you take the pills after having sexual intercourse. Most emergency contraceptive pills are available without a prescription. Check with your pharmacist. Do not use emergency contraception as your only form of birth control. BARRIER METHODS   Female condom This is a thin sheath (latex or rubber) that is worn over the penis during sexual intercourse. It can be used with spermicide to increase effectiveness.  Female condom. This is a soft, loose-fitting sheath that is put into the vagina before sexual intercourse.  Diaphragm This is a soft, latex, dome-shaped barrier that must be fitted by a health care provider. It is inserted into the vagina, along with a spermicidal jelly. It is inserted before intercourse. The diaphragm should be left in the vagina for 6 to 8 hours after intercourse.  Cervical cap This is a round, soft, latex or plastic cup that fits over the cervix and must be fitted by a health care provider. The cap can be left in place for up to 48 hours after intercourse.  Sponge This is a soft, circular piece of polyurethane foam. The sponge has spermicide in it. It is inserted into the vagina after wetting it and before sexual intercourse.  Spermicides These are chemicals that kill or block sperm from entering the cervix and uterus. They come in the form of creams, jellies, suppositories, foam, or tablets. They do not require a prescription. They are inserted into the vagina with an applicator before having sexual intercourse. The process must be repeated every time you have sexual intercourse. INTRAUTERINE CONTRACEPTION  Intrauterine device (IUD) This is a T-shaped device that is put in a woman's uterus during a menstrual period to prevent pregnancy. There are 2 types:  Copper IUD This type of IUD is wrapped in copper wire and is placed inside the uterus. Copper makes the uterus and fallopian tubes produce a fluid that kills sperm. It can stay in place for 10 years.  Hormone IUD  This type of IUD contains the hormone progestin (synthetic progesterone). The hormone thickens the cervical mucus and prevents sperm from entering the uterus, and it also thins the uterine lining to prevent implantation of a fertilized egg. The hormone can weaken or kill the sperm that get into the uterus. It can stay in place for 3 5 years, depending on which type of  IUD is used. PERMANENT METHODS OF CONTRACEPTION  Female tubal ligation This is when the woman's fallopian tubes are surgically sealed, tied, or blocked to prevent the egg from traveling to the uterus.  Hysteroscopic sterilization This involves placing a small coil or insert into each fallopian tube. Your doctor uses a technique called hysteroscopy to do the procedure. The device causes scar tissue to form. This results in permanent blockage of the fallopian tubes, so the sperm cannot fertilize the egg. It takes about 3 months after the procedure for the tubes to become blocked. You must use another form of birth control for these 3 months.  Female sterilization This is when the female has the tubes that carry sperm tied off (vasectomy).This blocks sperm from entering the vagina during sexual intercourse. After the procedure, the man can still ejaculate fluid (semen). NATURAL PLANNING METHODS  Natural family planning This is not having sexual intercourse or using a barrier method (condom, diaphragm, cervical cap) on days the woman could become pregnant.  Calendar method This is keeping track of the length of each menstrual cycle and identifying when you are fertile.  Ovulation method This is avoiding sexual intercourse during ovulation.  Symptothermal method This is avoiding sexual intercourse during ovulation, using a thermometer and ovulation symptoms.  Post ovulation method This is timing sexual intercourse after you have ovulated. Regardless of which type or method of contraception you choose, it is important that you use condoms to  protect against the transmission of sexually transmitted infections (STIs). Talk with your health care provider about which form of contraception is most appropriate for you. Document Released: 09/24/2005 Document Revised: 05/27/2013 Document Reviewed: 03/19/2013 [facility] Patient Information 2014 [place].  Breastfeeding Deciding to breastfeed is one of the best choices you can make for you and your baby. A change in hormones during pregnancy causes your breast tissue to grow and increases the number and size of your milk ducts. These hormones also allow proteins, sugars, and fats from your blood supply to make breast milk in your milk-producing glands. Hormones prevent breast milk from being released before your baby is born as well as prompt milk flow after birth. Once breastfeeding has begun, thoughts of your baby, as well as his or her sucking or crying, can stimulate the release of milk from your milk-producing glands.  BENEFITS OF BREASTFEEDING For Your Baby  Your first milk (colostrum) helps your baby's digestive system function better.   There are antibodies in your milk that help your baby fight off infections.   Your baby has a lower incidence of asthma, allergies, and sudden infant death syndrome.   The nutrients in breast milk are better for your baby than infant formulas and are designed uniquely for your baby's needs.   Breast milk improves your baby's brain development.   Your baby is less likely to develop other conditions, such as childhood obesity, asthma, or type 2 diabetes mellitus.  For You   Breastfeeding helps to create a very special bond between you and your baby.   Breastfeeding is convenient. Breast milk is always available at the correct temperature and costs nothing.   Breastfeeding helps to burn calories and helps you lose the weight gained during pregnancy.   Breastfeeding makes your uterus contract to its prepregnancy size faster and slows  bleeding (lochia) after you give birth.   Breastfeeding helps to lower your risk of developing type 2 diabetes mellitus, osteoporosis, and breast or ovarian cancer later in life. SIGNS THAT YOUR  BABY IS HUNGRY Early Signs of Hunger  Increased alertness or activity.  Stretching.  Movement of the head from side to side.  Movement of the head and opening of the mouth when the corner of the mouth or cheek is stroked (rooting).  Increased sucking sounds, smacking lips, cooing, sighing, or squeaking.  Hand-to-mouth movements.  Increased sucking of fingers or hands. Late Signs of Hunger  Fussing.  Intermittent crying. Extreme Signs of Hunger Signs of extreme hunger will require calming and consoling before your baby will be able to breastfeed successfully. Do not wait for the following signs of extreme hunger to occur before you initiate breastfeeding:   Restlessness.  A loud, strong cry.   Screaming. BREASTFEEDING BASICS Breastfeeding Initiation  Find a comfortable place to sit or lie down, with your neck and back well supported.  Place a pillow or rolled up blanket under your baby to bring him or her to the level of your breast (if you are seated). Nursing pillows are specially designed to help support your arms and your baby while you breastfeed.  Make sure that your baby's abdomen is facing your abdomen.   Gently massage your breast. With your fingertips, massage from your chest wall toward your nipple in a circular motion. This encourages milk flow. You may need to continue this action during the feeding if your milk flows slowly.  Support your breast with 4 fingers underneath and your thumb above your nipple. Make sure your fingers are well away from your nipple and your baby's mouth.   Stroke your baby's lips gently with your finger or nipple.   When your baby's mouth is open wide enough, quickly bring your baby to your breast, placing your entire nipple and as  much of the colored area around your nipple (areola) as possible into your baby's mouth.   More areola should be visible above your baby's upper lip than below the lower lip.   Your baby's tongue should be between his or her lower gum and your breast.   Ensure that your baby's mouth is correctly positioned around your nipple (latched). Your baby's lips should create a seal on your breast and be turned out (everted).  It is common for your baby to suck about 2 3 minutes in order to start the flow of breast milk. Latching Teaching your baby how to latch on to your breast properly is very important. An improper latch can cause nipple pain and decreased milk supply for you and poor weight gain in your baby. Also, if your baby is not latched onto your nipple properly, he or she may swallow some air during feeding. This can make your baby fussy. Burping your baby when you switch breasts during the feeding can help to get rid of the air. However, teaching your baby to latch on properly is still the best way to prevent fussiness from swallowing air while breastfeeding. Signs that your baby has successfully latched on to your nipple:    Silent tugging or silent sucking, without causing you pain.   Swallowing heard between every 3 4 sucks.    Muscle movement above and in front of his or her ears while sucking.  Signs that your baby has not successfully latched on to nipple:   Sucking sounds or smacking sounds from your baby while breastfeeding.  Nipple pain. If you think your baby has not latched on correctly, slip your finger into the corner of your baby's mouth to break the suction and place  it between your baby's gums. Attempt breastfeeding initiation again. Signs of Successful Breastfeeding Signs from your baby:   A gradual decrease in the number of sucks or complete cessation of sucking.   Falling asleep.   Relaxation of his or her body.   Retention of a small amount of milk in  his or her mouth.   Letting go of your breast by himself or herself. Signs from you:  Breasts that have increased in firmness, weight, and size 1 3 hours after feeding.   Breasts that are softer immediately after breastfeeding.  Increased milk volume, as well as a change in milk consistency and color by the 5th day of breastfeeding.   Nipples that are not sore, cracked, or bleeding. Signs That Your Baby is Getting Enough Milk  Wetting at least 3 diapers in a 24-hour period. The urine should be clear and pale yellow by age 5 days.  At least 3 stools in a 24-hour period by age 5 days. The stool should be soft and yellow.  At least 3 stools in a 24-hour period by age 7 days. The stool should be seedy and yellow.  No loss of weight greater than 10% of birth weight during the first 3 days of age.  Average weight gain of 4 7 ounces (120 210 mL) per week after age 4 days.  Consistent daily weight gain by age 5 days, without weight loss after the age of 2 weeks. After a feeding, your baby may spit up a small amount. This is common. BREASTFEEDING FREQUENCY AND DURATION Frequent feeding will help you make more milk and can prevent sore nipples and breast engorgement. Breastfeed when you feel the need to reduce the fullness of your breasts or when your baby shows signs of hunger. This is called "breastfeeding on demand." Avoid introducing a pacifier to your baby while you are working to establish breastfeeding (the first 4 6 weeks after your baby is born). After this time you may choose to use a pacifier. Research has shown that pacifier use during the first year of a baby's life decreases the risk of sudden infant death syndrome (SIDS). Allow your baby to feed on each breast as long as he or she wants. Breastfeed until your baby is finished feeding. When your baby unlatches or falls asleep while feeding from the first breast, offer the second breast. Because newborns are often sleepy in the  first few weeks of life, you may need to awaken your baby to get him or her to feed. Breastfeeding times will vary from baby to baby. However, the following rules can serve as a guide to help you ensure that your baby is properly fed:  Newborns (babies 4 weeks of age or younger) may breastfeed every 1 3 hours.  Newborns should not go longer than 3 hours during the day or 5 hours during the night without breastfeeding.  You should breastfeed your baby a minimum of 8 times in a 24-hour period until you begin to introduce solid foods to your baby at around 6 months of age. BREAST MILK PUMPING Pumping and storing breast milk allows you to ensure that your baby is exclusively fed your breast milk, even at times when you are unable to breastfeed. This is especially important if you are going back to work while you are still breastfeeding or when you are not able to be present during feedings. Your lactation consultant can give you guidelines on how long it is safe to store breast   milk.  A breast pump is a machine that allows you to pump milk from your breast into a sterile bottle. The pumped breast milk can then be stored in a refrigerator or freezer. Some breast pumps are operated by hand, while others use electricity. Ask your lactation consultant which type will work best for you. Breast pumps can be purchased, but some hospitals and breastfeeding support groups lease breast pumps on a monthly basis. A lactation consultant can teach you how to hand express breast milk, if you prefer not to use a pump.  CARING FOR YOUR BREASTS WHILE YOU BREASTFEED Nipples can become dry, cracked, and sore while breastfeeding. The following recommendations can help keep your breasts moisturized and healthy:  Avoid using soap on your nipples.   Wear a supportive bra. Although not required, special nursing bras and tank tops are designed to allow access to your breasts for breastfeeding without taking off your entire bra  or top. Avoid wearing underwire style bras or extremely tight bras.  Air dry your nipples for 3 84minutes after each feeding.   Use only cotton bra pads to absorb leaked breast milk. Leaking of breast milk between feedings is normal.   Use lanolin on your nipples after breastfeeding. Lanolin helps to maintain your skin's normal moisture barrier. If you use pure lanolin you do not need to wash it off before feeding your baby again. Pure lanolin is not toxic to your baby. You may also hand express a few drops of breast milk and gently massage that milk into your nipples and allow the milk to air dry. In the first few weeks after giving birth, some women experience extremely full breasts (engorgement). Engorgement can make your breasts feel heavy, warm, and tender to the touch. Engorgement peaks within 3 5 days after you give birth. The following recommendations can help ease engorgement:  Completely empty your breasts while breastfeeding or pumping. You may want to start by applying warm, moist heat (in the shower or with warm water-soaked hand towels) just before feeding or pumping. This increases circulation and helps the milk flow. If your baby does not completely empty your breasts while breastfeeding, pump any extra milk after he or she is finished.  Wear a snug bra (nursing or regular) or tank top for 1 2 days to signal your body to slightly decrease milk production.  Apply ice packs to your breasts, unless this is too uncomfortable for you.  Make sure that your baby is latched on and positioned properly while breastfeeding. If engorgement persists after 48 hours of following these recommendations, contact your health care provider or a Science writer. OVERALL HEALTH CARE RECOMMENDATIONS WHILE BREASTFEEDING  Eat healthy foods. Alternate between meals and snacks, eating 3 of each per day. Because what you eat affects your breast milk, some of the foods may make your baby more irritable  than usual. Avoid eating these foods if you are sure that they are negatively affecting your baby.  Drink milk, fruit juice, and water to satisfy your thirst (about 10 glasses a day).   Rest often, relax, and continue to take your prenatal vitamins to prevent fatigue, stress, and anemia.  Continue breast self-awareness checks.  Avoid chewing and smoking tobacco.  Avoid alcohol and drug use. Some medicines that may be harmful to your baby can pass through breast milk. It is important to ask your health care provider before taking any medicine, including all over-the-counter and prescription medicine as well as vitamin and herbal supplements.  It is possible to become pregnant while breastfeeding. If birth control is desired, ask your health care provider about options that will be safe for your baby. SEEK MEDICAL CARE IF:   You feel like you want to stop breastfeeding or have become frustrated with breastfeeding.  You have painful breasts or nipples.  Your nipples are cracked or bleeding.  Your breasts are red, tender, or warm.  You have a swollen area on either breast.  You have a fever or chills.  You have nausea or vomiting.  You have drainage other than breast milk from your nipples.  Your breasts do not become full before feedings by the 5th day after you give birth.  You feel sad and depressed.  Your baby is too sleepy to eat well.  Your baby is having trouble sleeping.   Your baby is wetting less than 3 diapers in a 24-hour period.  Your baby has less than 3 stools in a 24-hour period.  Your baby's skin or the white part of his or her eyes becomes yellow.   Your baby is not gaining weight by 6 days of age. SEEK IMMEDIATE MEDICAL CARE IF:   Your baby is overly tired (lethargic) and does not want to wake up and feed.  Your baby develops an unexplained fever. Document Released: 09/24/2005 Document Revised: 05/27/2013 Document Reviewed: 03/18/2013 [facility]  Patient Information 2014 [place].

## 2013-11-26 ENCOUNTER — Telehealth: Payer: Self-pay | Admitting: *Deleted

## 2013-11-26 ENCOUNTER — Encounter: Payer: Self-pay | Admitting: *Deleted

## 2013-11-26 NOTE — Telephone Encounter (Signed)
Phone is busy and sounds non operational, will send letter.  Letter sent.

## 2013-11-26 NOTE — Telephone Encounter (Signed)
Message copied by Dorothyann PengHAIZLIP, Samiya Mervin E on Thu Nov 26, 2013  4:02 PM ------      Message from: CONSTANT, PEGGY      Created: Thu Nov 26, 2013 11:47 AM      Regarding: needs colpo       Please inform patient of abnormal pap smear and need for colpo            Thanks            Peggy ------

## 2013-12-03 ENCOUNTER — Ambulatory Visit (HOSPITAL_COMMUNITY)
Admission: RE | Admit: 2013-12-03 | Discharge: 2013-12-03 | Disposition: A | Discharge status: Home / Self Care | Payer: Medicaid Other | Source: Ambulatory Visit | Attending: Obstetrics and Gynecology | Admitting: Obstetrics and Gynecology

## 2013-12-03 ENCOUNTER — Telehealth: Payer: Self-pay | Admitting: *Deleted

## 2013-12-03 DIAGNOSIS — Z3682 Encounter for antenatal screening for nuchal translucency: Secondary | ICD-10-CM

## 2013-12-03 DIAGNOSIS — O3510X0 Maternal care for (suspected) chromosomal abnormality in fetus, unspecified, not applicable or unspecified: Secondary | ICD-10-CM | POA: Insufficient documentation

## 2013-12-03 DIAGNOSIS — O34219 Maternal care for unspecified type scar from previous cesarean delivery: Secondary | ICD-10-CM | POA: Insufficient documentation

## 2013-12-03 DIAGNOSIS — Z3689 Encounter for other specified antenatal screening: Secondary | ICD-10-CM | POA: Insufficient documentation

## 2013-12-03 DIAGNOSIS — O351XX0 Maternal care for (suspected) chromosomal abnormality in fetus, not applicable or unspecified: Secondary | ICD-10-CM | POA: Insufficient documentation

## 2013-12-03 NOTE — Telephone Encounter (Signed)
Message copied by Gerome ApleyZEYFANG, Clemmie Marxen L on Thu Dec 03, 2013 12:30 PM ------      Message from: CONSTANT, PEGGY      Created: Thu Dec 03, 2013  7:34 AM       colpo      ----- Message -----         From: Simona HuhLinda Lusk Manny Vitolo, RN         Sent: 12/02/2013  11:48 AM           To: Catalina AntiguaPeggy Constant, MD            Dr. Jolayne Pantheronstant - per Ms. Hefter's pap- shows LSIL- is [redacted] wk pregnant, only 23 years old so not hpv testing.   Do you want us to scheduled colpo or repeat pap in 1 year- guidelines appear to me to say either is acceptable.       Raynald BlendLinda Chrisa Hassan , RN       ------

## 2013-12-03 NOTE — Telephone Encounter (Addendum)
Called Cathy Webster to notify of abnormal pap- needs colpo. Left message we are calling with some information, and we are closed until Monday- is non- urgent and we will talk to you next week.  3/5 1645 Called pt again to notify of need for Colpo due to Abnormal Pap. I left a message stating that I am calling with test results and to please call back. Pt may state whether it is ok to leave detailed information on her voice mail when she calls back.  Diane Day RNC

## 2013-12-04 ENCOUNTER — Encounter (HOSPITAL_COMMUNITY): Payer: Self-pay

## 2013-12-07 ENCOUNTER — Encounter: Payer: Self-pay | Admitting: Obstetrics and Gynecology

## 2013-12-08 ENCOUNTER — Encounter: Payer: Self-pay | Admitting: *Deleted

## 2013-12-09 ENCOUNTER — Other Ambulatory Visit: Payer: Self-pay

## 2013-12-11 NOTE — Telephone Encounter (Signed)
Pt. Called stating she is returning call for results.

## 2013-12-11 NOTE — Telephone Encounter (Signed)
Pt called the front desk and I informed her of her abnormal pap smear and the need for a colposcopy.  I explained the colposcopy procedure and advised pt that because she is pregnant if the provider feels that they can wait to biopsy then she may have another colposcopy after she delivers but the provider may also decide to biopsy.  I advised her that the appt she has will just also include a colpo.  Pt stated understanding.

## 2013-12-16 DIAGNOSIS — Z369 Encounter for antenatal screening, unspecified: Secondary | ICD-10-CM

## 2013-12-22 ENCOUNTER — Ambulatory Visit (INDEPENDENT_AMBULATORY_CARE_PROVIDER_SITE_OTHER): Payer: Medicaid Other | Admitting: Obstetrics and Gynecology

## 2013-12-22 ENCOUNTER — Telehealth: Payer: Self-pay | Admitting: *Deleted

## 2013-12-22 VITALS — BP 131/80 | Temp 97.8°F | Wt 270.1 lb

## 2013-12-22 DIAGNOSIS — Z3480 Encounter for supervision of other normal pregnancy, unspecified trimester: Secondary | ICD-10-CM

## 2013-12-22 DIAGNOSIS — K5900 Constipation, unspecified: Secondary | ICD-10-CM

## 2013-12-22 DIAGNOSIS — R87619 Unspecified abnormal cytological findings in specimens from cervix uteri: Secondary | ICD-10-CM

## 2013-12-22 DIAGNOSIS — Z3490 Encounter for supervision of normal pregnancy, unspecified, unspecified trimester: Secondary | ICD-10-CM

## 2013-12-22 LAB — POCT URINALYSIS DIP (DEVICE)
BILIRUBIN URINE: NEGATIVE
Glucose, UA: NEGATIVE mg/dL
Hgb urine dipstick: NEGATIVE
KETONES UR: NEGATIVE mg/dL
LEUKOCYTES UA: NEGATIVE
Nitrite: NEGATIVE
Protein, ur: NEGATIVE mg/dL
Urobilinogen, UA: 0.2 mg/dL (ref 0.0–1.0)
pH: 6 (ref 5.0–8.0)

## 2013-12-22 MED ORDER — DOCUSATE SODIUM 100 MG PO CAPS: 100.0000 mg | ORAL_CAPSULE | Freq: Two times a day (BID) | ORAL | Status: DC | PRN

## 2013-12-22 NOTE — Patient Instructions (Addendum)
Colposcopy Colposcopy is a procedure to examine your cervix and vagina, or the area around the outside of your vagina, for abnormalities or signs of disease. The procedure is done using a lighted microscope called a colposcope. Tissue samples may be collected during the colposcopy if your health care provider finds any unusual cells. A colposcopy may be done if a woman has:  An abnormal Pap test. A Pap test is a medical test done to evaluate cells that are on the surface of the cervix.  A Pap test result that is suggestive of human papillomavirus (HPV). This virus can cause genital warts and is linked to the development of cervical cancer.  A sore on her cervix and the results of a Pap test were normal.  Genital warts on the cervix or in or around the outside of the vagina.  A mother who took the drug diethylstilbestrol (DES) while pregnant.  Painful intercourse.  Vaginal bleeding, especially after sexual intercourse. LET [facility] CARE PROVIDER KNOW ABOUT:  Any allergies you have.  All medicines you are taking, including vitamins, herbs, eye drops, creams, and over-the-counter medicines.  Previous problems you or members of your family have had with the use of anesthetics.  Any blood disorders you have.  Previous surgeries you have had.  Medical conditions you have. RISKS AND COMPLICATIONS Generally, a colposcopy is a safe procedure. However, as with any procedure, complications can occur. Possible complications include:  Bleeding.  Infection.  Missed lesions. BEFORE THE PROCEDURE   Tell your health care provider if you have your menstrual period. A colposcopy typically is not done during menstruation.  For 24 hours before the colposcopy, do not:  Douche.  Use tampons.  Use medicines, creams, or suppositories in the vagina.  Have sexual intercourse. PROCEDURE  During the procedure, you will be lying on your back with your feet in foot rests (stirrups). A warm  metal or plastic instrument (speculum) will be placed in your vagina to keep it open and to allow the health care provider to see the cervix. The colposcope will be placed outside the vagina. It will be used to magnify and examine the cervix, vagina, and the area around the outside of the vagina. A small amount of liquid solution will be placed on the area that is to be viewed. This solution will make it easier to see the abnormal cells. Your health care provider will use tools to suck out mucus and cells from the canal of the cervix. Then he or she will record the location of the abnormal areas. If a biopsy is done during the procedure, a medicine will usually be given to numb the area (local anesthetic). You may feel mild pain or cramping while the biopsy is done. After the procedure, tissue samples collected during the biopsy will be sent to a lab for analysis. AFTER THE PROCEDURE  You will be given instructions on when to follow up with your health care provider for your test results. It is important to keep your appointment. Document Released: 12/15/2002 Document Revised: 05/27/2013 Document Reviewed: 04/23/2013 [facility] Patient Information 2014 [place], [place]. Constipation, Adult Constipation is when a person has fewer than 3 bowel movements a week; has difficulty having a bowel movement; or has stools that are dry, hard, or larger than normal. As people grow older, constipation is more common. If you try to fix constipation with medicines that make you have a bowel movement (laxatives), the problem may get worse. Long-term laxative use may cause the  muscles of the colon to become weak. A low-fiber diet, not taking in enough fluids, and taking certain medicines may make constipation worse. CAUSES   Certain medicines, such as antidepressants, pain medicine, iron supplements, antacids, and water pills.   Certain diseases, such as diabetes, irritable bowel syndrome (IBS), thyroid disease, or  depression.   Not drinking enough water.   Not eating enough fiber-rich foods.   Stress or travel.  Lack of physical activity or exercise.  Not going to the restroom when there is the urge to have a bowel movement.  Ignoring the urge to have a bowel movement.  Using laxatives too much. SYMPTOMS   Having fewer than 3 bowel movements a week.   Straining to have a bowel movement.   Having hard, dry, or larger than normal stools.   Feeling full or bloated.   Pain in the lower abdomen.  Not feeling relief after having a bowel movement. DIAGNOSIS  Your caregiver will take a medical history and perform a physical exam. Further testing may be done for severe constipation. Some tests may include:   A barium enema X-ray to examine your rectum, colon, and sometimes, your small intestine.  A sigmoidoscopy to examine your lower colon.  A colonoscopy to examine your entire colon. TREATMENT  Treatment will depend on the severity of your constipation and what is causing it. Some dietary treatments include drinking more fluids and eating more fiber-rich foods. Lifestyle treatments may include regular exercise. If these diet and lifestyle recommendations do not help, your caregiver may recommend taking over-the-counter laxative medicines to help you have bowel movements. Prescription medicines may be prescribed if over-the-counter medicines do not work.  HOME CARE INSTRUCTIONS   Increase dietary fiber in your diet, such as fruits, vegetables, whole grains, and beans. Limit high-fat and processed sugars in your diet, such as Jamaica[place] fries, hamburgers, cookies, candies, and soda.   A fiber supplement may be added to your diet if you cannot get enough fiber from foods.   Drink enough fluids to keep your urine clear or pale yellow.   Exercise regularly or as directed by your caregiver.   Go to the restroom when you have the urge to go. Do not hold it.  Only take medicines as  directed by your caregiver. Do not take other medicines for constipation without talking to your caregiver first. SEEK IMMEDIATE MEDICAL CARE IF:   You have bright red blood in your stool.   Your constipation lasts for more than 4 days or gets worse.   You have abdominal or rectal pain.   You have thin, pencil-like stools.  You have unexplained weight loss. MAKE SURE YOU:   Understand these instructions.  Will watch your condition.  Will get help right away if you are not doing well or get worse. Document Released: 06/22/2004 Document Revised: 12/17/2011 Document Reviewed: 07/06/2013 [facility] Patient Information 2014 [place].

## 2013-12-22 NOTE — Progress Notes (Signed)
Colpo appt 01/11/14. Anatomic US scheduled. Constipation. Rx Colace.  Doing well. Fundus at U-2.

## 2013-12-22 NOTE — Progress Notes (Signed)
P=100 pt reports left abd pain, has been constipated, last bowel movement 12/22/2013 with little results.

## 2013-12-22 NOTE — Telephone Encounter (Signed)
Karlene left without getting afp or quad  Drawn- called and left message to please call clinic

## 2013-12-23 NOTE — Telephone Encounter (Signed)
Called patient and discussed missing getting her quad screen drawn. Patient stated she would just get it done at her next appt on 4/9. Made note on appt to draw at that visit. Patient had no further questions

## 2014-01-12 ENCOUNTER — Ambulatory Visit (HOSPITAL_COMMUNITY)
Admission: RE | Admit: 2014-01-12 | Discharge: 2014-01-12 | Disposition: A | Discharge status: Home / Self Care | Payer: Medicaid Other | Source: Ambulatory Visit | Attending: Obstetrics and Gynecology | Admitting: Obstetrics and Gynecology

## 2014-01-12 ENCOUNTER — Ambulatory Visit (HOSPITAL_COMMUNITY): Payer: Medicaid Other

## 2014-01-12 DIAGNOSIS — Z3689 Encounter for other specified antenatal screening: Secondary | ICD-10-CM | POA: Insufficient documentation

## 2014-01-12 DIAGNOSIS — O34219 Maternal care for unspecified type scar from previous cesarean delivery: Secondary | ICD-10-CM | POA: Insufficient documentation

## 2014-01-12 DIAGNOSIS — Z3490 Encounter for supervision of normal pregnancy, unspecified, unspecified trimester: Secondary | ICD-10-CM

## 2014-01-14 ENCOUNTER — Telehealth: Payer: Self-pay | Admitting: General Practice

## 2014-01-14 ENCOUNTER — Encounter: Payer: Self-pay | Admitting: Family Medicine

## 2014-01-14 NOTE — Telephone Encounter (Signed)
Patient called back in to front office. Told patient she doesn't need to come for the colpo today and that she will need a pap with hpv testing in one year and that we will see her for her OB appt next week. Patient verbalized understanding to all and had no further questions

## 2014-01-14 NOTE — Telephone Encounter (Signed)
Per Dr Reola CalkinsBeck, due to patient's age and pap results- no need for colpo today can just repeat pap in one year. Patient has appt today. Called patient, no answer- left message stating we are calling to talk to her about her appt today, please call us back at the clinic before this afternoon.

## 2014-01-19 ENCOUNTER — Ambulatory Visit (INDEPENDENT_AMBULATORY_CARE_PROVIDER_SITE_OTHER): Payer: Medicaid Other | Admitting: Advanced Practice Midwife

## 2014-01-19 VITALS — BP 124/78 | Temp 98.6°F | Wt 270.7 lb

## 2014-01-19 DIAGNOSIS — IMO0002 Reserved for concepts with insufficient information to code with codable children: Secondary | ICD-10-CM

## 2014-01-19 DIAGNOSIS — Z0489 Encounter for examination and observation for other specified reasons: Secondary | ICD-10-CM

## 2014-01-19 DIAGNOSIS — O09899 Supervision of other high risk pregnancies, unspecified trimester: Secondary | ICD-10-CM

## 2014-01-19 DIAGNOSIS — Z1389 Encounter for screening for other disorder: Secondary | ICD-10-CM

## 2014-01-19 NOTE — Progress Notes (Signed)
Doing well.  Good fetal movement, denies vaginal bleeding, LOF, cramping/contractions.  Reviewed normal anatomy u/s and first trimester screen results.  Needs AFP today.

## 2014-01-19 NOTE — Progress Notes (Signed)
Pulse- 101 

## 2014-01-20 LAB — POCT URINALYSIS DIP (DEVICE)
Bilirubin Urine: NEGATIVE
Glucose, UA: NEGATIVE mg/dL
Hgb urine dipstick: NEGATIVE
Ketones, ur: NEGATIVE mg/dL
Leukocytes, UA: NEGATIVE
Nitrite: NEGATIVE
PH: 6.5 (ref 5.0–8.0)
PROTEIN: NEGATIVE mg/dL
Specific Gravity, Urine: 1.025 (ref 1.005–1.030)
Urobilinogen, UA: 0.2 mg/dL (ref 0.0–1.0)

## 2014-01-22 LAB — ALPHA FETOPROTEIN, MATERNAL
AFP: 49.1 [IU]/mL
CURR GEST AGE: 19.5 wks.days
MOM FOR AFP: 1.44
OPEN SPINA BIFIDA: NEGATIVE
Osb Risk: 1:6680 {titer}

## 2014-02-02 ENCOUNTER — Encounter: Payer: Self-pay | Admitting: *Deleted

## 2014-02-02 ENCOUNTER — Telehealth: Payer: Self-pay | Admitting: *Deleted

## 2014-02-02 NOTE — Telephone Encounter (Signed)
Pt left message requesting her US to be scheduled. I called her back and the voice mailbox was full.  I scheduled her US on 5/12 @ 2:45pm and sent a message to pt via My Chart.

## 2014-02-05 ENCOUNTER — Encounter (HOSPITAL_BASED_OUTPATIENT_CLINIC_OR_DEPARTMENT_OTHER): Payer: Self-pay | Admitting: Emergency Medicine

## 2014-02-05 ENCOUNTER — Emergency Department (HOSPITAL_BASED_OUTPATIENT_CLINIC_OR_DEPARTMENT_OTHER)
Admission: EM | Admit: 2014-02-05 | Discharge: 2014-02-05 | Disposition: A | Discharge status: Home / Self Care | Payer: Medicaid Other | Attending: Emergency Medicine | Admitting: Emergency Medicine

## 2014-02-05 DIAGNOSIS — Z3480 Encounter for supervision of other normal pregnancy, unspecified trimester: Secondary | ICD-10-CM | POA: Insufficient documentation

## 2014-02-05 DIAGNOSIS — Z3492 Encounter for supervision of normal pregnancy, unspecified, second trimester: Secondary | ICD-10-CM

## 2014-02-05 NOTE — Discharge Instructions (Signed)
Stay hydrated.   Try to sleep on your left side and not on your back.   Follow up with your OB doctor.   Return to ER if you have abdominal pain or cramps, vaginal bleeding.

## 2014-02-05 NOTE — ED Notes (Addendum)
Dr. Silverio LayYao did bedside US and was able to visualize the fetus moving actively - and mother was able to observe. She said she was also able to feel it kicking at that point. HR was strong and WNL.  The fetal heart beating was also visualized.  Per Dr. Silverio LayYao there is no reason to call Rapid Response and pt will be DC'd to home at this time.  Pt advised that if she does have any complications with this pregnancy in the future [facility] She said she came here today because we are faster.

## 2014-02-05 NOTE — ED Notes (Signed)
Pt reports she realized today that she has not felt baby move "in the last few days"-denies pain, vaginal bleeding and d/c

## 2014-02-05 NOTE — ED Provider Notes (Signed)
CSN: [id number]     Arrival date & time 02/05/14  1530 History   First MD Initiated Contact with Patient 02/05/14 1551     Chief Complaint  Patient presents with  . Routine Prenatal Visit     (Consider location/radiation/quality/duration/timing/severity/associated sxs/prior Treatment) The history is provided by the patient.  Cathy Webster is a 23 y.o. female [redacted] weeks pregnant here with concern that the baby is not moving. She didn't feel her baby moving last night. She was on her side and tried to lay on her back. She also tried to drink some water. Today she went to work and came after work. Denies any vaginal bleeding or abdominal cramps. [facility] Had nl IUP documented a month ago.    Past Medical History  Diagnosis Date  . Abnormal Pap smear 2008   Past Surgical History  Procedure Laterality Date  . Cesarean section    . Tonsillectomy     Family History  Problem Relation Age of Onset  . Diabetes Mother   . Asthma Brother    History  Substance Use Topics  . Smoking status: Never Smoker   . Smokeless tobacco: Never Used  . Alcohol Use: No   OB History   Grav Para Term Preterm Abortions TAB SAB Ect Mult Living   3 2 2       2      Review of Systems  Genitourinary:       Baby not moving   All other systems reviewed and are negative.     Allergies  Review of patient's allergies indicates no known allergies.  Home Medications   Prior to Admission medications   Medication Sig Start Date End Date Taking? Authorizing Provider  docusate sodium (COLACE) 100 MG capsule Take 1 capsule (100 mg total) by mouth 2 (two) times daily as needed. 12/22/13   Deirdre Colin Mulders Poe, CNM  Prenatal Vit-Fe Fumarate-FA (PRENATAL MULTIVITAMIN) TABS tablet Take 1 tablet by mouth at bedtime. 11/24/13   Peggy Constant, MD   BP 154/73  Pulse 84  Temp(Src) 98.5 F (36.9 C) (Oral)  Resp 16  Ht 5\' 5"  (1.651 m)  Wt 267 lb (121.11 kg)  BMI 44.43 kg/m2  SpO2 100%  LMP  09/09/2013 Physical Exam  Nursing note and vitals reviewed. Constitutional: She is oriented to person, place, and time. She appears well-developed and well-nourished.  Slightly anxious   HENT:  Head: Normocephalic.  Mouth/Throat: Oropharynx is clear and moist.  Eyes: Conjunctivae are normal.  Neck: Normal range of motion. Neck supple.  Cardiovascular: Normal rate, regular rhythm and normal heart sounds.   Pulmonary/Chest: Breath sounds normal. No respiratory distress. She has no wheezes. She has no rales.  Abdominal: Soft. Bowel sounds are normal. She exhibits no distension. There is no tenderness.  Gravid uterus, nontender   Musculoskeletal: Normal range of motion.  Neurological: She is alert and oriented to person, place, and time. No cranial nerve deficit. Coordination normal.  Skin: Skin is warm and dry.  Psychiatric: She has a normal mood and affect. Her behavior is normal. Thought content normal.    ED Course  Procedures (including critical care time)  EMERGENCY DEPARTMENT US PREGNANCY "Study: Limited Ultrasound of the Pelvis"  INDICATIONS:Pregnancy(required) Multiple views of the uterus and pelvic cavity are obtained with a multi-frequency probe.  APPROACH:Transabdominal   PERFORMED BY: Myself  IMAGES ARCHIVED?: Yes  LIMITATIONS: Emergent procedure  PREGNANCY FREE FLUID: Medium  PREGNANCY UTERUS FINDINGS:Uterus enlarged and Gestational sac noted ADNEXAL  FINDINGS:Left ovary not seen and Right ovary not seen  PREGNANCY FINDINGS: Intrauterine gestational sac noted, Yolk sac noted, Fetal pole present and Fetal heart activity seen  INTERPRETATION: Viable intrauterine pregnancy  GESTATIONAL AGE, ESTIMATE: 22 weeks  FETAL HEART RATE: 180  COMMENT(Estimate of Gestational Age):  Baby is moving well, nl FHR    Labs Review Labs Reviewed - No data to display  Imaging Review No results found.   EKG Interpretation None      MDM   Final diagnoses:  Second  trimester pregnancy    Cathy BosKeyeria Muff is a 23 y.o. female here with concern that baby is not moving. Baby moving well on US. Nl FHR. Briefly on baby monitor and there are no contractions. I reassured her. She will f/u with OB.     Richardean Canalavid H Yao, MD 02/05/14 [phone]

## 2014-02-16 ENCOUNTER — Encounter: Payer: Self-pay | Admitting: Advanced Practice Midwife

## 2014-02-16 ENCOUNTER — Ambulatory Visit (INDEPENDENT_AMBULATORY_CARE_PROVIDER_SITE_OTHER): Payer: Medicaid Other | Admitting: Advanced Practice Midwife

## 2014-02-16 ENCOUNTER — Ambulatory Visit (HOSPITAL_COMMUNITY)
Admission: RE | Admit: 2014-02-16 | Discharge: 2014-02-16 | Disposition: A | Discharge status: Home / Self Care | Payer: Medicaid Other | Source: Ambulatory Visit | Attending: Advanced Practice Midwife | Admitting: Advanced Practice Midwife

## 2014-02-16 VITALS — BP 105/65 | HR 107 | Temp 97.0°F | Wt 274.5 lb

## 2014-02-16 DIAGNOSIS — Z363 Encounter for antenatal screening for malformations: Secondary | ICD-10-CM | POA: Insufficient documentation

## 2014-02-16 DIAGNOSIS — Z3490 Encounter for supervision of normal pregnancy, unspecified, unspecified trimester: Secondary | ICD-10-CM

## 2014-02-16 DIAGNOSIS — Z1389 Encounter for screening for other disorder: Secondary | ICD-10-CM | POA: Insufficient documentation

## 2014-02-16 DIAGNOSIS — Z0489 Encounter for examination and observation for other specified reasons: Secondary | ICD-10-CM

## 2014-02-16 DIAGNOSIS — IMO0002 Reserved for concepts with insufficient information to code with codable children: Secondary | ICD-10-CM

## 2014-02-16 DIAGNOSIS — O09899 Supervision of other high risk pregnancies, unspecified trimester: Secondary | ICD-10-CM

## 2014-02-16 LAB — POCT URINALYSIS DIP (DEVICE)
GLUCOSE, UA: NEGATIVE mg/dL
HGB URINE DIPSTICK: NEGATIVE
KETONES UR: 15 mg/dL — AB
Leukocytes, UA: NEGATIVE
Nitrite: NEGATIVE
Protein, ur: 30 mg/dL — AB
Urobilinogen, UA: 0.2 mg/dL (ref 0.0–1.0)
pH: 6 (ref 5.0–8.0)

## 2014-02-16 NOTE — Progress Notes (Signed)
Doing well. Followup US normal.

## 2014-02-16 NOTE — Patient Instructions (Signed)
Second Trimester of Pregnancy The second trimester is from week 13 through week 28, months 4 through 6. The second trimester is often a time when you feel your best. Your body has also adjusted to being pregnant, and you begin to feel better physically. Usually, morning sickness has lessened or quit completely, you may have more energy, and you may have an increase in appetite. The second trimester is also a time when the fetus is growing rapidly. At the end of the sixth month, the fetus is about 9 inches long and weighs about 1 pounds. You will likely begin to feel the baby move (quickening) between 18 and 20 weeks of the pregnancy. BODY CHANGES Your body goes through many changes during pregnancy. The changes vary from woman to woman.   Your weight will continue to increase. You will notice your lower abdomen bulging out.  You may begin to get stretch marks on your hips, abdomen, and breasts.  You may develop headaches that can be relieved by medicines approved by your caregiver.  You may urinate more often because the fetus is pressing on your bladder.  You may develop or continue to have heartburn as a result of your pregnancy.  You may develop constipation because certain hormones are causing the muscles that push waste through your intestines to slow down.  You may develop hemorrhoids or swollen, bulging veins (varicose veins).  You may have back pain because of the weight gain and pregnancy hormones relaxing your joints between the bones in your pelvis and as a result of a shift in weight and the muscles that support your balance.  Your breasts will continue to grow and be tender.  Your gums may bleed and may be sensitive to brushing and flossing.  Dark spots or blotches (chloasma, mask of pregnancy) may develop on your face. This will likely fade after the baby is born.  A dark line from your belly button to the pubic area (linea nigra) may appear. This will likely fade after the  baby is born. WHAT TO EXPECT AT YOUR PRENATAL VISITS During a routine prenatal visit:  You will be weighed to make sure you and the fetus are growing normally.  Your blood pressure will be taken.  Your abdomen will be measured to track your baby's growth.  The fetal heartbeat will be listened to.  Any test results from the previous visit will be discussed. Your caregiver may ask you:  How you are feeling.  If you are feeling the baby move.  If you have had any abnormal symptoms, such as leaking fluid, bleeding, severe headaches, or abdominal cramping.  If you have any questions. Other tests that may be performed during your second trimester include:  Blood tests that check for:  Low iron levels (anemia).  Gestational diabetes (between 24 and 28 weeks).  Rh antibodies.  Urine tests to check for infections, diabetes, or protein in the urine.  An ultrasound to confirm the proper growth and development of the baby.  An amniocentesis to check for possible genetic problems.  Fetal screens for spina bifida and Down syndrome. HOME CARE INSTRUCTIONS   Avoid all smoking, herbs, alcohol, and unprescribed drugs. These chemicals affect the formation and growth of the baby.  Follow your caregiver's instructions regarding medicine use. There are medicines that are either safe or unsafe to take during pregnancy.  Exercise only as directed by your caregiver. Experiencing uterine cramps is a good sign to stop exercising.  Continue to eat regular,   healthy meals.  Wear a good support bra for breast tenderness.  Do not use hot tubs, steam rooms, or saunas.  Wear your seat belt at all times when driving.  Avoid raw meat, uncooked cheese, cat litter boxes, and soil used by cats. These carry germs that can cause birth defects in the baby.  Take your prenatal vitamins.  Try taking a stool softener (if your caregiver approves) if you develop constipation. Eat more high-fiber foods,  such as fresh vegetables or fruit and whole grains. Drink plenty of fluids to keep your urine clear or pale yellow.  Take warm sitz baths to soothe any pain or discomfort caused by hemorrhoids. Use hemorrhoid cream if your caregiver approves.  If you develop varicose veins, wear support hose. Elevate your feet for 15 minutes, 3 4 times a day. Limit salt in your diet.  Avoid heavy lifting, wear low heel shoes, and practice good posture.  Rest with your legs elevated if you have leg cramps or low back pain.  Visit your dentist if you have not gone yet during your pregnancy. Use a soft toothbrush to brush your teeth and be gentle when you floss.  A sexual relationship may be continued unless your caregiver directs you otherwise.  Continue to go to all your prenatal visits as directed by your caregiver. SEEK MEDICAL CARE IF:   You have dizziness.  You have mild pelvic cramps, pelvic pressure, or nagging pain in the abdominal area.  You have persistent nausea, vomiting, or diarrhea.  You have a bad smelling vaginal discharge.  You have pain with urination. SEEK IMMEDIATE MEDICAL CARE IF:   You have a fever.  You are leaking fluid from your vagina.  You have spotting or bleeding from your vagina.  You have severe abdominal cramping or pain.  You have rapid weight gain or loss.  You have shortness of breath with chest pain.  You notice sudden or extreme swelling of your face, hands, ankles, feet, or legs.  You have not felt your baby move in over an hour.  You have severe headaches that do not go away with medicine.  You have vision changes. Document Released: 09/18/2001 Document Revised: 05/27/2013 Document Reviewed: 11/25/2012 Exit[employer] Patient Information 2014 [employer].  

## 2014-03-03 ENCOUNTER — Encounter: Payer: Self-pay | Admitting: General Practice

## 2014-03-16 ENCOUNTER — Ambulatory Visit: Payer: BC Managed Care – PPO | Admitting: Obstetrics and Gynecology

## 2014-03-16 ENCOUNTER — Encounter: Payer: Self-pay | Admitting: Obstetrics and Gynecology

## 2014-03-16 VITALS — BP 124/80 | HR 92 | Temp 97.1°F | Wt 279.5 lb

## 2014-03-16 DIAGNOSIS — Z98891 History of uterine scar from previous surgery: Secondary | ICD-10-CM | POA: Insufficient documentation

## 2014-03-16 DIAGNOSIS — Z23 Encounter for immunization: Secondary | ICD-10-CM

## 2014-03-16 DIAGNOSIS — Z3490 Encounter for supervision of normal pregnancy, unspecified, unspecified trimester: Secondary | ICD-10-CM

## 2014-03-16 LAB — POCT URINALYSIS DIP (DEVICE)
Bilirubin Urine: NEGATIVE
GLUCOSE, UA: NEGATIVE mg/dL
HGB URINE DIPSTICK: NEGATIVE
Ketones, ur: NEGATIVE mg/dL
NITRITE: NEGATIVE
Protein, ur: NEGATIVE mg/dL
Specific Gravity, Urine: >=1.03 (ref 1.005–1.030)
Urobilinogen, UA: 0.2 mg/dL (ref 0.0–1.0)
pH: 6 (ref 5.0–8.0)

## 2014-03-16 LAB — CBC
HEMATOCRIT: 35.3 % — AB (ref 36.0–46.0)
Hemoglobin: 12.1 g/dL (ref 12.0–15.0)
MCH: 28.2 pg (ref 26.0–34.0)
MCHC: 34.3 g/dL (ref 30.0–36.0)
MCV: 82.3 fL (ref 78.0–100.0)
Platelets: 166 10*3/uL (ref 150–400)
RBC: 4.29 MIL/uL (ref 3.87–5.11)
RDW: 15 % (ref 11.5–15.5)
WBC: 6.9 10*3/uL (ref 4.0–10.5)

## 2014-03-16 MED ORDER — TETANUS-DIPHTH-ACELL PERTUSSIS 5-2.5-18.5 LF-MCG/0.5 IM SUSP: 0.5000 mL | Freq: Once | INTRAMUSCULAR | Status: DC

## 2014-03-16 NOTE — Progress Notes (Signed)
TOLAC consent signed. Doing well. 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

## 2014-03-16 NOTE — Patient Instructions (Signed)
Vaginal Birth After Cesarean Delivery Vaginal birth after cesarean delivery (VBAC) is giving birth vaginally after previously delivering a baby by a cesarean. In the past, if a woman had a cesarean delivery, all births afterwards would be done by cesarean delivery. This is no longer true. It can be safe for the mother to try a vaginal delivery after having a cesarean delivery.  It is important to discuss VBAC with your health care provider early in the pregnancy so you can understand the risks, benefits, and options. It will give you time to decide what is best in your particular case. The final decision about whether to have a VBAC or repeat cesarean delivery should be between you and your health care provider. Any changes in your health or your baby's health during your pregnancy may make it necessary to change your initial decision about VBAC.  WOMEN WHO PLAN TO HAVE A VBAC SHOULD CHECK WITH THEIR HEALTH CARE PROVIDER TO BE SURE THAT:  The previous cesarean delivery was done with a low transverse uterine cut (incision) (not a vertical classical incision).   The birth canal is big enough for the baby.   There were no other operations on the uterus.   An electronic fetal monitor (EFM) will be on at all times during labor.   An operating room will be available and ready in case an emergency cesarean delivery is needed.   A health care provider and surgical nursing staff will be available at all times during labor to be ready to do an emergency delivery cesarean if necessary.   An anesthesiologist will be present in case an emergency cesarean delivery is needed.   The nursery is prepared and has adequate personnel and necessary equipment available to care for the baby in case of an emergency cesarean delivery. BENEFITS OF VBAC  Shorter stay in the hospital.   Avoidance of risks associated with cesarean delivery, such as:  Surgical complications, such as opening of the incision or  hernia in the incision.  Injury to other organs.  Fever. This can occur if an infection develops after surgery. It can also occur as a reaction to the medicine given to make you numb during the surgery.  Less blood loss and need for blood transfusions.  Lower risk of blood clots and infection.  Shorter recovery.   Decreased risk for having to remove the uterus (hysterectomy).   Decreased risk for the placenta to completely or partially cover the opening of the uterus (placenta previa) with a future pregnancy.   Decrease risk in future labor and delivery. RISKS OF A VBAC  Tearing (rupture) of the uterus. This is occurs in less than 1% of VBACs. The risk of this happening is higher if:  Steps are taken to begin the labor process (induce labor) or stimulate or strengthen contractions (augment labor).   Medicine is used to soften (ripen) the cervix.  Having to remove the uterus (hysterectomy) if it ruptures. VBAC SHOULD NOT BE DONE IF:  The previous cesarean delivery was done with a vertical (classical) or T-shaped incision or you do not know what kind of incision was made.   You had a ruptured uterus.   You have had certain types of surgery on your uterus, such as removal of uterine fibroids. Ask your health care provider about other types of surgeries that prevent you from having a VBAC.  You have certain medical or childbirth (obstetrical) problems.   There are problems with the baby.   You   have had two previous cesarean deliveries and no vaginal deliveries. OTHER FACTS TO KNOW ABOUT VBAC:  It is safe to have an epidural anesthetic with VBAC.   It is safe to turn the baby from a breech position (attempt an external cephalic version).   It is safe to try a VBAC with twins.   VBAC may not be successful if your baby weights 8.8 lb (4 kg) or more. However, weight predictions are not always accurate and should not be used alone to decide if VBAC is right for  you.  There is an increased failure rate if the time between the cesarean delivery and VBAC is less than 19 months.   Your health care provider may advise against a VBAC if you have preeclampsia (high blood pressure, protein in the urine, and swelling of face and extremities).   VBAC is often successful if you previously gave birth vaginally.   VBAC is often successful when the labor starts spontaneously before the due date.   Delivering a baby through a VBAC is similar to having a normal spontaneous vaginal delivery. Document Released: 03/17/2007 Document Revised: 07/15/2013 Document Reviewed: 04/23/2013 Exit[employer] Patient Information 2014 [employer].  

## 2014-03-17 LAB — GLUCOSE TOLERANCE, 1 HOUR (50G) W/O FASTING: Glucose, 1 Hour GTT: 65 mg/dL — ABNORMAL LOW (ref 70–140)

## 2014-03-17 LAB — RPR

## 2014-03-17 LAB — HIV ANTIBODY (ROUTINE TESTING W REFLEX): HIV 1&2 Ab, 4th Generation: NONREACTIVE

## 2014-03-24 ENCOUNTER — Encounter: Payer: Self-pay | Admitting: *Deleted

## 2014-03-30 ENCOUNTER — Encounter (HOSPITAL_COMMUNITY): Payer: Self-pay

## 2014-03-30 ENCOUNTER — Encounter: Payer: Self-pay | Admitting: Advanced Practice Midwife

## 2014-04-14 ENCOUNTER — Ambulatory Visit (INDEPENDENT_AMBULATORY_CARE_PROVIDER_SITE_OTHER): Payer: Medicaid Other | Admitting: Family

## 2014-04-14 VITALS — BP 133/80 | HR 116 | Wt 282.0 lb

## 2014-04-14 DIAGNOSIS — Z3490 Encounter for supervision of normal pregnancy, unspecified, unspecified trimester: Secondary | ICD-10-CM

## 2014-04-14 DIAGNOSIS — Z3480 Encounter for supervision of other normal pregnancy, unspecified trimester: Secondary | ICD-10-CM

## 2014-04-14 DIAGNOSIS — Z98891 History of uterine scar from previous surgery: Secondary | ICD-10-CM

## 2014-04-14 DIAGNOSIS — O34219 Maternal care for unspecified type scar from previous cesarean delivery: Secondary | ICD-10-CM

## 2014-04-14 LAB — POCT URINALYSIS DIP (DEVICE)
BILIRUBIN URINE: NEGATIVE
GLUCOSE, UA: NEGATIVE mg/dL
HGB URINE DIPSTICK: NEGATIVE
Ketones, ur: NEGATIVE mg/dL
Nitrite: NEGATIVE
Protein, ur: NEGATIVE mg/dL
Specific Gravity, Urine: 1.02 (ref 1.005–1.030)
Urobilinogen, UA: 0.2 mg/dL (ref 0.0–1.0)
pH: 7 (ref 5.0–8.0)

## 2014-04-14 NOTE — Progress Notes (Signed)
No questions or concerns.  Denies UTI symptoms.  Mod leuks > urine culture sent.

## 2014-04-27 ENCOUNTER — Encounter: Payer: Self-pay | Admitting: Obstetrics & Gynecology

## 2014-04-27 ENCOUNTER — Encounter: Payer: Self-pay | Admitting: Advanced Practice Midwife

## 2014-04-28 IMAGING — US US OB COMP +14 WK
1 series · 13 of 28 positions shown · non-contrast
Comparison: none

[Series 1: us ob comp +14 wk · 13 of 90 slices shown]
[im 4/90]
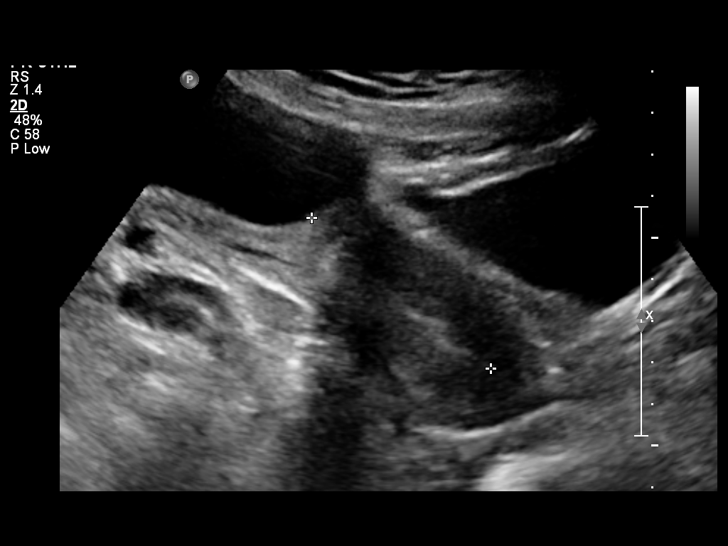
[im 10/90]
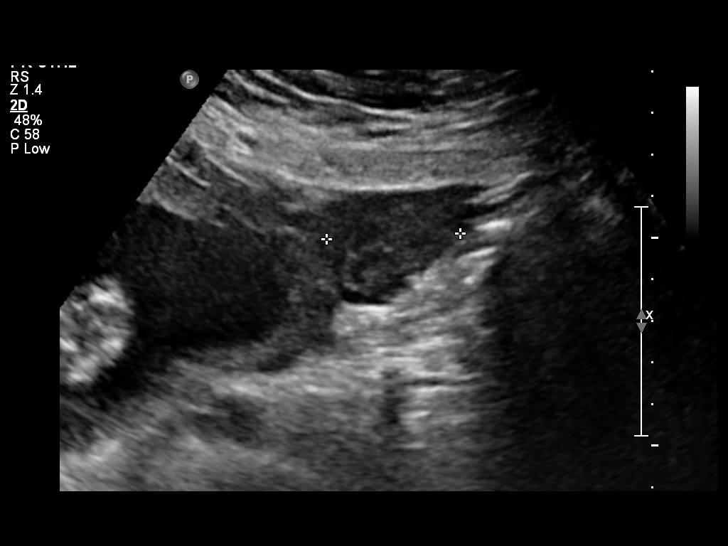
[im 17/90]
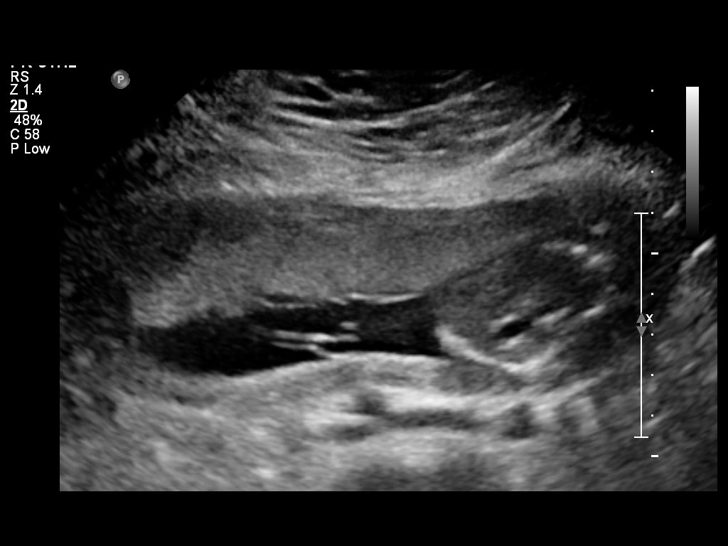
[im 24/90]
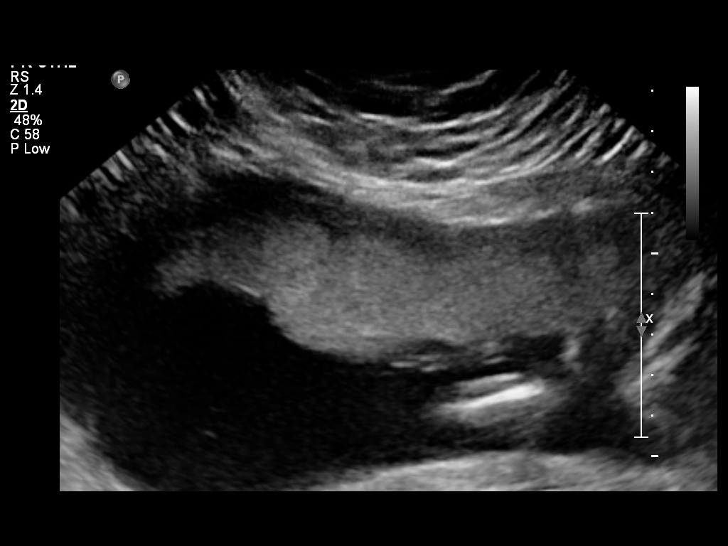
[im 30/90]
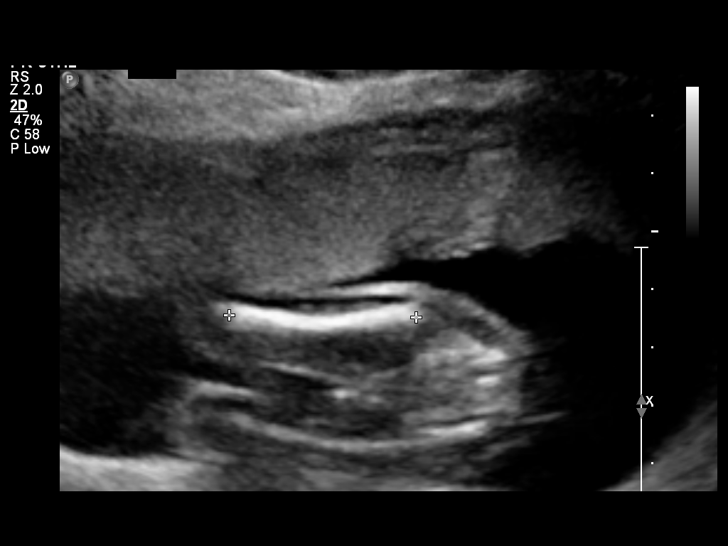
[im 37/90]
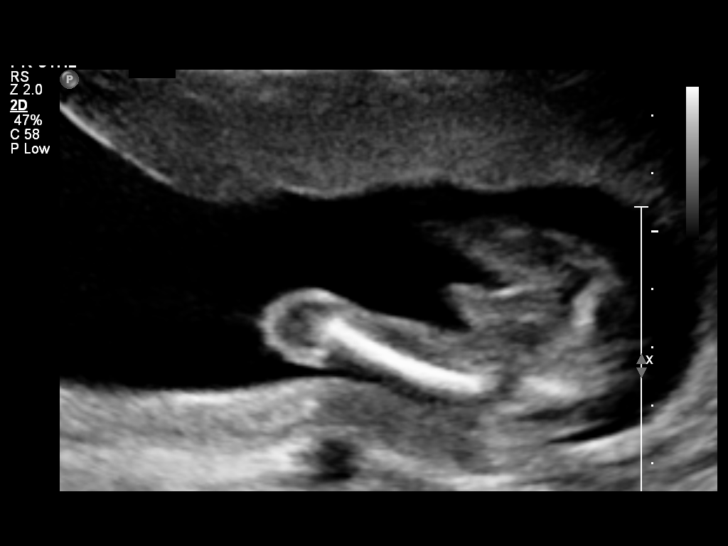
[im 47/90]
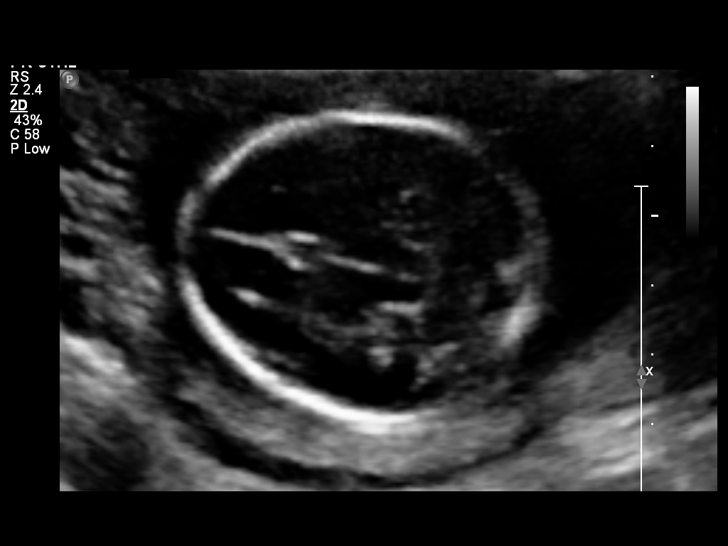
[im 53/90]
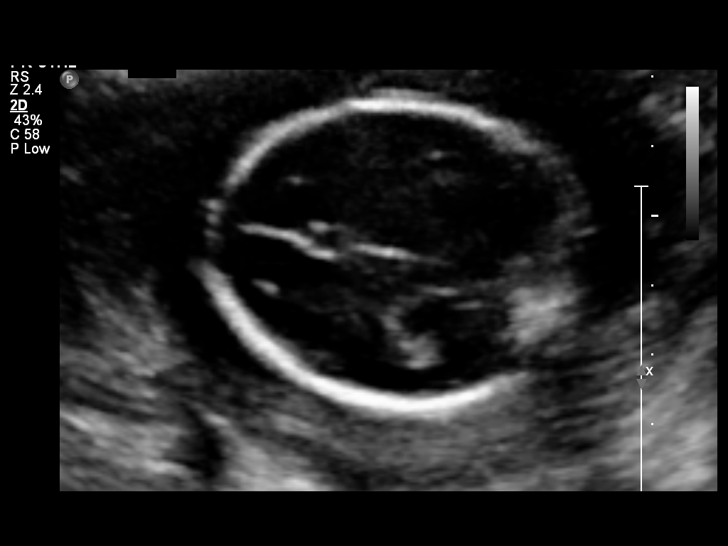
[im 60/90]
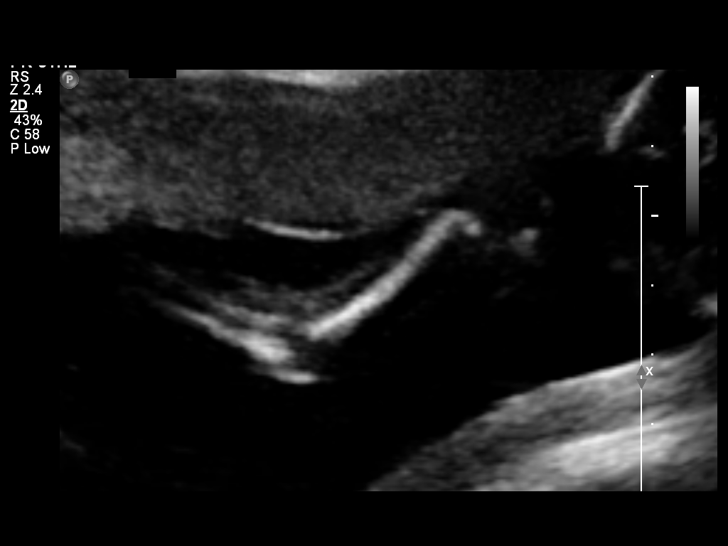
[im 66/90]
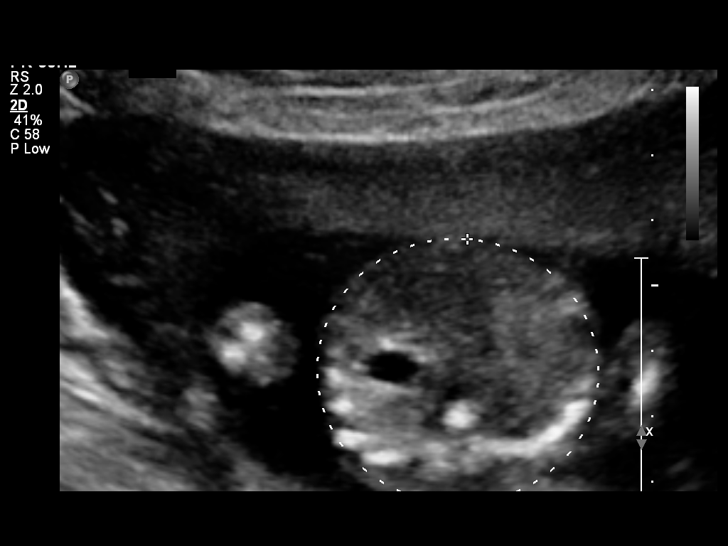
[im 73/90]
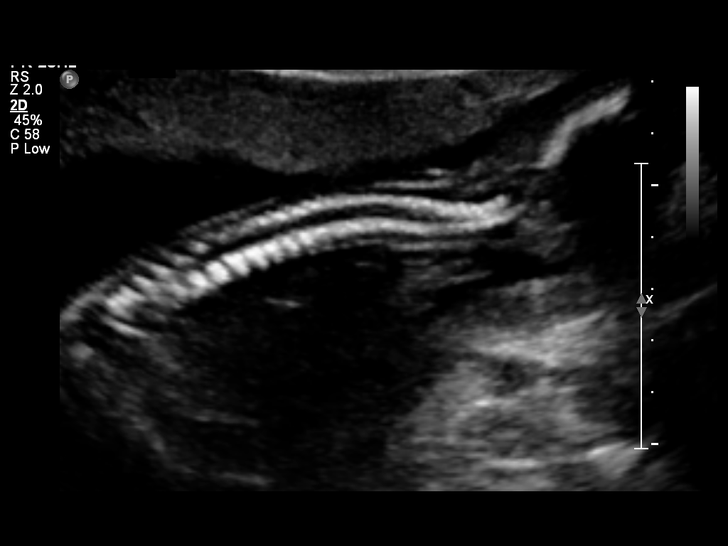
[im 80/90]
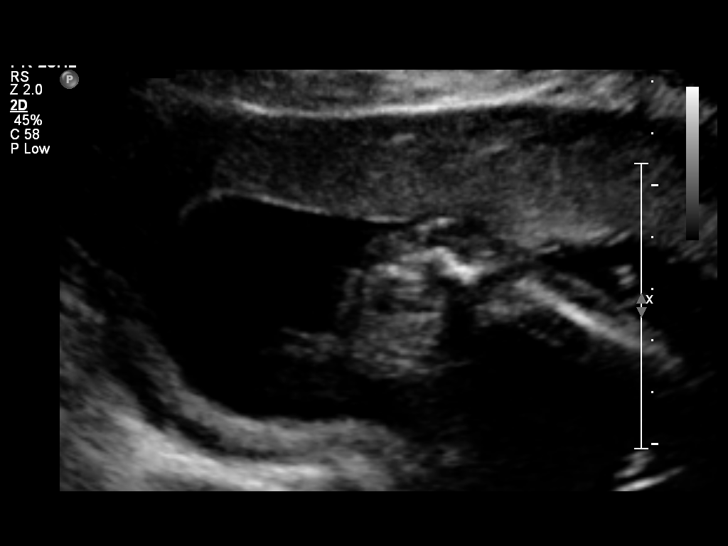
[im 86/90]
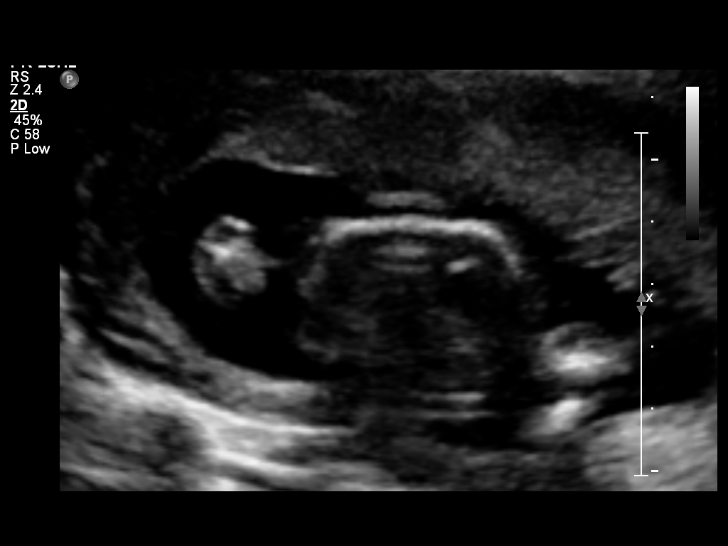

[13 of 28 positions shown; findings below may reference images not displayed]

OBSTETRICS REPORT
                      (Signed Final 01/12/2014 [DATE])

Service(s) Provided

 US OB COMP + 14 WK                                    76805.1
Indications

 Previous cesarean section
 Basic anatomic survey
Fetal Evaluation

 Num Of Fetuses:    1
 Fetal Heart Rate:  160                          bpm
 Cardiac Activity:  Observed
 Presentation:      Cephalic
 Placenta:          Anterior, above cervical os
 P. Cord            Visualized, central
 Insertion:

 Amniotic Fluid
 AFI FV:      Subjectively within normal limits
                                             Larg Pckt:    4.90  cm
Gestational Age

 LMP:           17w 6d        Date:  09/09/13                 EDD:   06/16/14
Anatomy

 Cranium:          Appears normal         Aortic Arch:      Appears normal
 Fetal Cavum:      Appears normal         Ductal Arch:      Appears normal
 Ventricles:       Appears normal         Diaphragm:        Not well visualized
 Choroid Plexus:   Appears normal         Stomach:          Appears normal, left
                                                            sided
 Cerebellum:       Appears normal         Abdomen:          Appears normal
 Posterior Fossa:  Appears normal         Abdominal Wall:   Appears nml (cord
                                                            insert, abd wall)
 Nuchal Fold:      Not applicable (>20    Cord Vessels:     Appears normal (3
                   wks GA)                                  vessel cord)
 Face:             Orbits appear          Kidneys:          Appear normal
                   normal
 Lips:             Not well visualized    Bladder:          Appears normal
 Heart:            Not well visualized    Spine:            Appears normal
 RVOT:             Not well visualized    Lower             Appears normal
                                          Extremities:
 LVOT:             Not well visualized    Upper             Appears normal
                                          Extremities:
 Other:  Fetus appears to be a male. Technically difficult due to  maternal
         habitus and early GA. Technically difficult due to fetal position.
Cervix Uterus Adnexa

 Cervical Length:    5.41     cm

 Cervix:       Normal appearance by transabdominal scan.
 Uterus:       No abnormality visualized.
 Cul De Sac:   No free fluid seen.

 Left Ovary:    Within normal limits.
 Right Ovary:   Within normal limits.
 Adnexa:     No abnormality visualized.
Impression

 Single IUP at 18w 5d
 Normal fetal anatomic survey
 Limited views of the fetal heart obtained
 No markers associated with aneuploidy noted
 Normal amniotic fluid volume
Recommendations

 Recommend follow-up ultrasound examination in 4-6 weeks
 to reevaluate the fetal heart

## 2014-04-30 ENCOUNTER — Encounter: Payer: Self-pay | Admitting: General Practice

## 2014-05-05 ENCOUNTER — Encounter: Payer: Self-pay | Admitting: General Practice

## 2014-05-05 ENCOUNTER — Ambulatory Visit (INDEPENDENT_AMBULATORY_CARE_PROVIDER_SITE_OTHER): Payer: Medicaid Other | Admitting: Advanced Practice Midwife

## 2014-05-05 VITALS — BP 124/65 | HR 102 | Temp 97.7°F | Wt 280.4 lb

## 2014-05-05 DIAGNOSIS — N949 Unspecified condition associated with female genital organs and menstrual cycle: Secondary | ICD-10-CM

## 2014-05-05 DIAGNOSIS — O26893 Other specified pregnancy related conditions, third trimester: Secondary | ICD-10-CM | POA: Insufficient documentation

## 2014-05-05 DIAGNOSIS — O26849 Uterine size-date discrepancy, unspecified trimester: Secondary | ICD-10-CM

## 2014-05-05 DIAGNOSIS — R102 Pelvic and perineal pain: Principal | ICD-10-CM

## 2014-05-05 DIAGNOSIS — O9989 Other specified diseases and conditions complicating pregnancy, childbirth and the puerperium: Secondary | ICD-10-CM

## 2014-05-05 LAB — POCT URINALYSIS DIP (DEVICE)
BILIRUBIN URINE: NEGATIVE
Glucose, UA: NEGATIVE mg/dL
Hgb urine dipstick: NEGATIVE
KETONES UR: 15 mg/dL — AB
NITRITE: NEGATIVE
Protein, ur: 30 mg/dL — AB
SPECIFIC GRAVITY, URINE: 1.02 (ref 1.005–1.030)
UROBILINOGEN UA: 1 mg/dL (ref 0.0–1.0)
pH: 6.5 (ref 5.0–8.0)

## 2014-05-05 NOTE — Progress Notes (Signed)
Doing well.  Good fetal movement, denies vaginal bleeding, LOF, regular contractions.  Pt having hip pain when getting out of bed or walking.  Discussed pelvic girdle pain.  Use good ergonomics, use pillow between knees when sleeping, pregnancy support belt.

## 2014-05-05 NOTE — Progress Notes (Signed)
Pt reports frequent headaches, denies blurred vision or visual changes.  These headaches are not relieved by Tylenol/

## 2014-05-06 ENCOUNTER — Encounter: Payer: Self-pay | Admitting: *Deleted

## 2014-05-07 ENCOUNTER — Ambulatory Visit (HOSPITAL_COMMUNITY)
Admission: RE | Admit: 2014-05-07 | Discharge: 2014-05-07 | Disposition: A | Discharge status: Home / Self Care | Payer: Medicaid Other | Source: Ambulatory Visit | Attending: Advanced Practice Midwife | Admitting: Advanced Practice Midwife

## 2014-05-07 ENCOUNTER — Other Ambulatory Visit: Payer: Self-pay | Admitting: Advanced Practice Midwife

## 2014-05-07 DIAGNOSIS — Z3689 Encounter for other specified antenatal screening: Secondary | ICD-10-CM | POA: Diagnosis not present

## 2014-05-07 DIAGNOSIS — O26849 Uterine size-date discrepancy, unspecified trimester: Secondary | ICD-10-CM | POA: Diagnosis not present

## 2014-05-07 DIAGNOSIS — O365990 Maternal care for other known or suspected poor fetal growth, unspecified trimester, not applicable or unspecified: Secondary | ICD-10-CM | POA: Diagnosis not present

## 2014-05-07 DIAGNOSIS — O403XX1 Polyhydramnios, third trimester, fetus 1: Secondary | ICD-10-CM

## 2014-05-09 DIAGNOSIS — O403XX0 Polyhydramnios, third trimester, not applicable or unspecified: Secondary | ICD-10-CM | POA: Insufficient documentation

## 2014-05-11 ENCOUNTER — Telehealth: Payer: Self-pay

## 2014-05-11 ENCOUNTER — Inpatient Hospital Stay (HOSPITAL_COMMUNITY)
Admission: AD | Admit: 2014-05-11 | Discharge: 2014-05-11 | Disposition: A | Discharge status: Home / Self Care | Payer: BC Managed Care – PPO | Source: Ambulatory Visit | Attending: Family Medicine | Admitting: Family Medicine

## 2014-05-11 ENCOUNTER — Encounter (HOSPITAL_COMMUNITY): Payer: Self-pay | Admitting: *Deleted

## 2014-05-11 DIAGNOSIS — O479 False labor, unspecified: Secondary | ICD-10-CM

## 2014-05-11 DIAGNOSIS — O4700 False labor before 37 completed weeks of gestation, unspecified trimester: Secondary | ICD-10-CM | POA: Diagnosis present

## 2014-05-11 LAB — URINALYSIS, ROUTINE W REFLEX MICROSCOPIC
BILIRUBIN URINE: NEGATIVE
Glucose, UA: NEGATIVE mg/dL
Hgb urine dipstick: NEGATIVE
Ketones, ur: 40 mg/dL — AB
Nitrite: NEGATIVE
PROTEIN: NEGATIVE mg/dL
Specific Gravity, Urine: 1.02 (ref 1.005–1.030)
UROBILINOGEN UA: 0.2 mg/dL (ref 0.0–1.0)
pH: 6 (ref 5.0–8.0)

## 2014-05-11 LAB — URINE MICROSCOPIC-ADD ON

## 2014-05-11 NOTE — [facility] Note (Signed)
uc's every 4-5 minutes since 1600, denies bleeding or LOF.

## 2014-05-11 NOTE — Telephone Encounter (Signed)
Message copied by Faythe CasaBELLAMY, Lynsey Ange M on Tue May 11, 2014  1:41 PM ------      Message from: Sharen CounterLEFTWICH-KIRBY, LISA A      Created: Sun May 09, 2014  8:46 PM       Ultrasound indicates polyhydramnios with MFM recommending twice weekly testing.  Pt aware that her fundal height measurement was large at last visit.  Call pt to let her know about polyhydramnios and schedule first visit for NST/AFI.  Thank you. ------

## 2014-05-11 NOTE — [facility] Note (Signed)
Patient presents with cx 4-5 mins apart, 1-2 min duration, since 16:00.  Reports clear, thick discharge for past week.  Denies bleeding or LOF.  Reports pain of 0/10.

## 2014-05-11 NOTE — Telephone Encounter (Signed)
Pt called and stated that she saw in MyChart that she had polyhydramnios, pt spelled it out, and wanted to know why no one called her about the results.  I explained to the pt that we were going to call her today; we wanted to have an appt scheduled so that we could call her with results and her appt.  I educated pt on polyhydramnios and the need for the testing.  I informed her that her appt for NST/AFI has been scheduled for Thursday on 05/13/14 @ 1400.  I advised pt that at that appt the nurse will go into further discussion on what it means to have poly in pregnancy.  Pt requested that her FMLA paperwork be faxed to her job.  FMLA paperwork faxed to [phone] Attn: Ignatius Speckingonna Burnette as pt requested.  Pt then asked if she could be taken out of work because she experiences so much pain while she is at work.  I informed pt that the provider's note does indicate that she is having pain in the hips and has problems walking and getting OOB but it does not say she recommended her to be out of work.  I advised pt that she could speak with HR and inform them that she wants to be out of work earlier but that the provider has not medically indicated the need for her to be out of work.  Pt stated understanding and that she will be here on 05/13/14 at 2pm for her NST/AFI.

## 2014-05-11 NOTE — Discharge Instructions (Signed)
Braxton Hicks Contractions °Contractions of the uterus can occur throughout pregnancy. Contractions are not always a sign that you are in labor.  °WHAT ARE BRAXTON HICKS CONTRACTIONS?  °Contractions that occur before labor are called Braxton Hicks contractions, or false labor. Toward the end of pregnancy (32-34 weeks), these contractions can develop more often and may become more forceful. This is not true labor because these contractions do not result in opening (dilatation) and thinning of the cervix. They are sometimes difficult to tell apart from true labor because these contractions can be forceful and people have different pain tolerances. You should not feel embarrassed if you go to the hospital with false labor. Sometimes, the only way to tell if you are in true labor is for your health care provider to look for changes in the cervix. °If there are no prenatal problems or other health problems associated with the pregnancy, it is completely safe to be sent home with false labor and await the onset of true labor. °HOW CAN YOU TELL THE DIFFERENCE BETWEEN TRUE AND FALSE LABOR? °False Labor °· The contractions of false labor are usually shorter and not as hard as those of true labor.   °· The contractions are usually irregular.   °· The contractions are often felt in the front of the lower abdomen and in the groin.   °· The contractions may go away when you walk around or change positions while lying down.   °· The contractions get weaker and are shorter lasting as time goes on.   °· The contractions do not usually become progressively stronger, regular, and closer together as with true labor.   °True Labor °· Contractions in true labor last 30-70 seconds, become very regular, usually become more intense, and increase in frequency.   °· The contractions do not go away with walking.   °· The discomfort is usually felt in the top of the uterus and spreads to the lower abdomen and low back.   °· True labor can be  determined by your health care provider with an exam. This will show that the cervix is dilating and getting thinner.   °WHAT TO REMEMBER °· Keep up with your usual exercises and follow other instructions given by your health care provider.   °· Take medicines as directed by your health care provider.   °· Keep your regular prenatal appointments.   °· Eat and drink lightly if you think you are going into labor.   °· If Braxton Hicks contractions are making you uncomfortable:   °· Change your position from lying down or resting to walking, or from walking to resting.   °· Sit and rest in a tub of warm water.   °· Drink 2-3 glasses of water. Dehydration may cause these contractions.   °· Do slow and deep breathing several times an hour.   °WHEN SHOULD I SEEK IMMEDIATE MEDICAL CARE? °Seek immediate medical care if: °· Your contractions become stronger, more regular, and closer together.   °· You have fluid leaking or gushing from your vagina.   °· You have a fever.   °· You pass blood-tinged mucus.   °· You have vaginal bleeding.   °· You have continuous abdominal pain.   °· You have low back pain that you never had before.   °· You feel your baby's head pushing down and causing pelvic pressure.   °· Your baby is not moving as much as it used to.   °Document Released: 09/24/2005 Document Revised: 09/29/2013 Document Reviewed: 07/06/2013 °Exit[employer]® Patient Information ©2015 [employer]. This information is not intended to replace advice given to you by your health care   provider. Make sure you discuss any questions you have with your health care provider. °Preterm Labor Information °Preterm labor is when labor starts at less than 37 weeks of pregnancy. The normal length of a pregnancy is 39 to 41 weeks. °CAUSES °Often, there is no identifiable underlying cause as to why a woman goes into preterm labor. One of the most common known causes of preterm labor is infection. Infections of the uterus, cervix, vagina, amniotic  sac, bladder, kidney, or even the lungs (pneumonia) can cause labor to start. Other suspected causes of preterm labor include:  °· Urogenital infections, such as yeast infections and bacterial vaginosis.   °· Uterine abnormalities (uterine shape, uterine septum, fibroids, or bleeding from the placenta).   °· A cervix that has been operated on (it may fail to stay closed).   °· Malformations in the fetus.   °· Multiple gestations (twins, triplets, and so on).   °· Breakage of the amniotic sac.   °RISK FACTORS °· Having a previous history of preterm labor.   °· Having premature rupture of membranes (PROM).   °· Having a placenta that covers the opening of the cervix (placenta previa).   °· Having a placenta that separates from the uterus (placental abruption).   °· Having a cervix that is too weak to hold the fetus in the uterus (incompetent cervix).   °· Having too much fluid in the amniotic sac (polyhydramnios).   °· Taking illegal drugs or smoking while pregnant.   °· Not gaining enough weight while pregnant.   °· Being younger than 18 and older than 24 years old.   °· Having a low socioeconomic status.   °· Being African American. °SYMPTOMS °Signs and symptoms of preterm labor include:  °· Menstrual-like cramps, abdominal pain, or back pain. °· Uterine contractions that are regular, as frequent as six in an hour, regardless of their intensity (may be mild or painful). °· Contractions that start on the top of the uterus and spread down to the lower abdomen and back.   °· A sense of increased pelvic pressure.   °· A watery or bloody mucus discharge that comes from the vagina.   °TREATMENT °Depending on the length of the pregnancy and other circumstances, your health care provider may suggest bed rest. If necessary, there are medicines that can be given to stop contractions and to mature the fetal lungs. If labor happens before 34 weeks of pregnancy, a prolonged hospital stay may be recommended. Treatment depends on  the condition of both you and the fetus.  °WHAT SHOULD YOU DO IF YOU THINK YOU ARE IN PRETERM LABOR? °Call your health care provider right away. You will need to go to the hospital to get checked immediately. °HOW CAN YOU PREVENT PRETERM LABOR IN FUTURE PREGNANCIES? °You should:  °· Stop smoking if you smoke.  °· Maintain healthy weight gain and avoid chemicals and drugs that are not necessary. °· Be watchful for any type of infection. °· Inform your health care provider if you have a known history of preterm labor. °Document Released: 12/15/2003 Document Revised: 05/27/2013 Document Reviewed: 10/27/2012 °ExitCare® Patient Information ©2015 [employer]. This information is not intended to replace advice given to you by your health care provider. Make sure you discuss any questions you have with your health care provider. ° °

## 2014-05-12 ENCOUNTER — Encounter: Payer: Self-pay | Admitting: Advanced Practice Midwife

## 2014-05-13 ENCOUNTER — Ambulatory Visit (INDEPENDENT_AMBULATORY_CARE_PROVIDER_SITE_OTHER): Payer: BC Managed Care – PPO | Admitting: *Deleted

## 2014-05-13 VITALS — BP 116/62 | HR 101

## 2014-05-13 DIAGNOSIS — O409XX0 Polyhydramnios, unspecified trimester, not applicable or unspecified: Secondary | ICD-10-CM

## 2014-05-13 DIAGNOSIS — O403XX1 Polyhydramnios, third trimester, fetus 1: Secondary | ICD-10-CM

## 2014-05-13 DIAGNOSIS — O3090 Multiple gestation, unspecified, unspecified trimester: Secondary | ICD-10-CM

## 2014-05-13 LAB — US OB FOLLOW UP

## 2014-05-17 ENCOUNTER — Other Ambulatory Visit: Payer: Self-pay | Admitting: Obstetrics & Gynecology

## 2014-05-17 ENCOUNTER — Ambulatory Visit (INDEPENDENT_AMBULATORY_CARE_PROVIDER_SITE_OTHER): Payer: BC Managed Care – PPO | Admitting: Obstetrics & Gynecology

## 2014-05-17 VITALS — BP 129/68 | HR 98 | Wt 287.8 lb

## 2014-05-17 DIAGNOSIS — O409XX0 Polyhydramnios, unspecified trimester, not applicable or unspecified: Secondary | ICD-10-CM

## 2014-05-17 DIAGNOSIS — O3090 Multiple gestation, unspecified, unspecified trimester: Secondary | ICD-10-CM | POA: Diagnosis not present

## 2014-05-17 DIAGNOSIS — O403XX1 Polyhydramnios, third trimester, fetus 1: Secondary | ICD-10-CM

## 2014-05-17 DIAGNOSIS — Z3480 Encounter for supervision of other normal pregnancy, unspecified trimester: Secondary | ICD-10-CM

## 2014-05-17 DIAGNOSIS — Z3483 Encounter for supervision of other normal pregnancy, third trimester: Secondary | ICD-10-CM

## 2014-05-17 LAB — POCT URINALYSIS DIP (DEVICE)
BILIRUBIN URINE: NEGATIVE
Glucose, UA: NEGATIVE mg/dL
HGB URINE DIPSTICK: NEGATIVE
Nitrite: NEGATIVE
Protein, ur: NEGATIVE mg/dL
SPECIFIC GRAVITY, URINE: 1.02 (ref 1.005–1.030)
Urobilinogen, UA: 0.2 mg/dL (ref 0.0–1.0)
pH: 6 (ref 5.0–8.0)

## 2014-05-17 LAB — OB RESULTS CONSOLE GC/CHLAMYDIA
CHLAMYDIA, DNA PROBE: NEGATIVE
GC PROBE AMP, GENITAL: NEGATIVE

## 2014-05-17 NOTE — Patient Instructions (Signed)
Return to clinic for any obstetric concerns or go to [facility] for evaluation  

## 2014-05-17 NOTE — Progress Notes (Signed)
NST performed today was reviewed and was found to be reactive.  Continue recommended antenatal testing and prenatal care. Pelvic cultures done today.  BP stable. No other complaints or concerns. Preeclampsia, labor and fetal movement precautions reviewed.

## 2014-05-18 LAB — CULTURE, BETA STREP (GROUP B ONLY)

## 2014-05-18 LAB — GC/CHLAMYDIA PROBE AMP
CT Probe RNA: NEGATIVE
GC PROBE AMP APTIMA: NEGATIVE

## 2014-05-19 ENCOUNTER — Encounter: Payer: Self-pay | Admitting: Obstetrics & Gynecology

## 2014-05-19 DIAGNOSIS — O99820 Streptococcus B carrier state complicating pregnancy: Secondary | ICD-10-CM | POA: Insufficient documentation

## 2014-05-21 ENCOUNTER — Other Ambulatory Visit: Payer: Self-pay

## 2014-05-21 ENCOUNTER — Encounter: Payer: Self-pay | Admitting: General Practice

## 2014-05-25 NOTE — Progress Notes (Signed)
Patient ID: Cathy Webster, female   DOB: [birth year], 23 y.o.   MRN: [id number] NST 05/13/14 reactive

## 2014-05-26 ENCOUNTER — Encounter: Payer: Self-pay | Admitting: Physician Assistant

## 2014-05-26 ENCOUNTER — Ambulatory Visit (INDEPENDENT_AMBULATORY_CARE_PROVIDER_SITE_OTHER): Payer: BC Managed Care – PPO | Admitting: Advanced Practice Midwife

## 2014-05-26 VITALS — BP 98/75 | HR 110 | Temp 98.2°F | Wt 285.3 lb

## 2014-05-26 DIAGNOSIS — O403XX1 Polyhydramnios, third trimester, fetus 1: Secondary | ICD-10-CM

## 2014-05-26 DIAGNOSIS — O409XX0 Polyhydramnios, unspecified trimester, not applicable or unspecified: Secondary | ICD-10-CM

## 2014-05-26 LAB — FETAL NONSTRESS TEST

## 2014-05-26 LAB — POCT URINALYSIS DIP (DEVICE)
Bilirubin Urine: NEGATIVE
GLUCOSE, UA: NEGATIVE mg/dL
Hgb urine dipstick: NEGATIVE
KETONES UR: NEGATIVE mg/dL
Nitrite: NEGATIVE
Protein, ur: NEGATIVE mg/dL
SPECIFIC GRAVITY, URINE: 1.015 (ref 1.005–1.030)
UROBILINOGEN UA: 0.2 mg/dL (ref 0.0–1.0)
pH: 7 (ref 5.0–8.0)

## 2014-05-26 NOTE — Progress Notes (Signed)
Doing well.  Good fetal movement, denies vaginal bleeding, LOF, regular contractions.  Pt does report episodes of uncomfortable contractions daily which resolve.  Desires cervical exam.  Polyhydramnios resolved on U/S last week, normal again today.  No longer needs twice weekly testing, return next week for OB visit.  Labor precautions/warning signs given.

## 2014-05-26 NOTE — Progress Notes (Signed)
Patient reports pelvic and vaginal pain/pressure

## 2014-05-28 ENCOUNTER — Other Ambulatory Visit: Payer: Self-pay

## 2014-05-31 NOTE — Progress Notes (Signed)
NST 05/13/14 reactive 

## 2014-06-02 ENCOUNTER — Ambulatory Visit (INDEPENDENT_AMBULATORY_CARE_PROVIDER_SITE_OTHER): Payer: BC Managed Care – PPO | Admitting: Obstetrics and Gynecology

## 2014-06-02 ENCOUNTER — Encounter: Payer: Self-pay | Admitting: Obstetrics and Gynecology

## 2014-06-02 VITALS — BP 116/69 | HR 107 | Wt 287.4 lb

## 2014-06-02 DIAGNOSIS — O409XX0 Polyhydramnios, unspecified trimester, not applicable or unspecified: Secondary | ICD-10-CM

## 2014-06-02 DIAGNOSIS — O403XX1 Polyhydramnios, third trimester, fetus 1: Secondary | ICD-10-CM

## 2014-06-02 LAB — POCT URINALYSIS DIP (DEVICE)
Bilirubin Urine: NEGATIVE
Glucose, UA: NEGATIVE mg/dL
Hgb urine dipstick: NEGATIVE
Nitrite: NEGATIVE
Protein, ur: NEGATIVE mg/dL
Specific Gravity, Urine: 1.015 (ref 1.005–1.030)
Urobilinogen, UA: 1 mg/dL (ref 0.0–1.0)
pH: 7 (ref 5.0–8.0)

## 2014-06-02 IMAGING — US US OB FOLLOW-UP
1 series · 12 of 28 positions shown · non-contrast
Comparison: none

[Series 1: us ob follow up · 72 acquisitions, 12 frames shown]
[im 3/72]
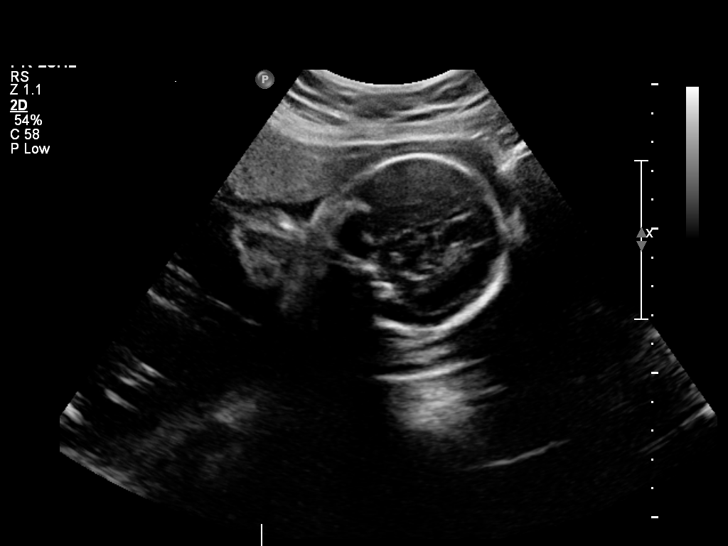
[im 8/72]
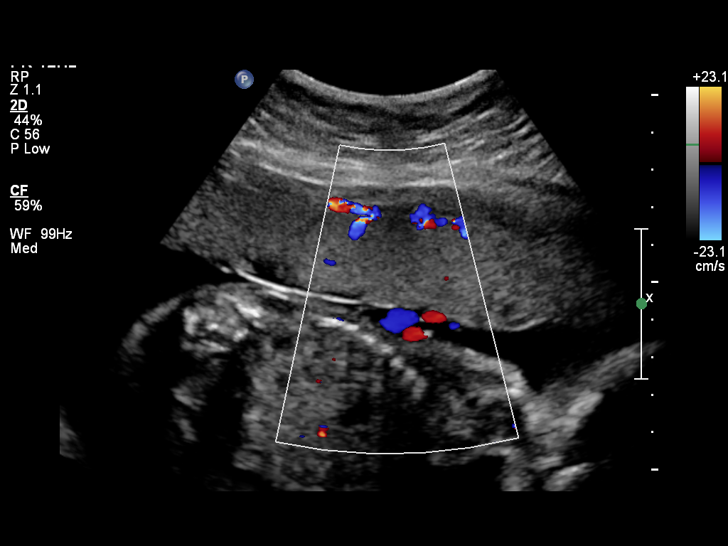
[im 14/72]
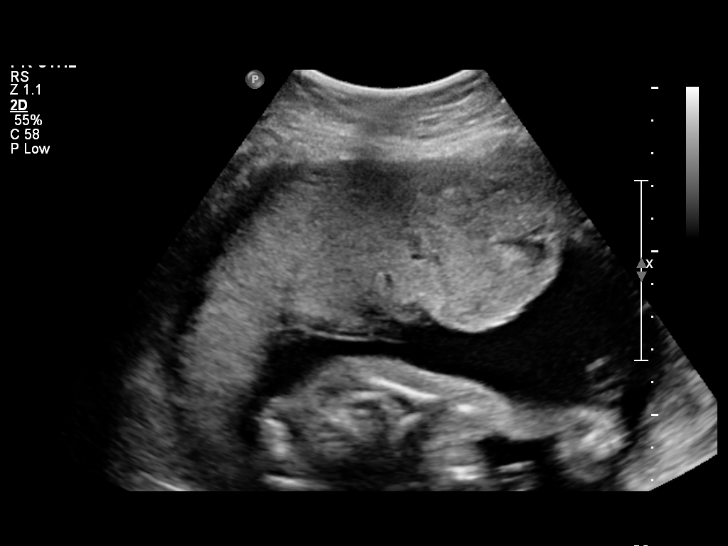
[im 22/72]
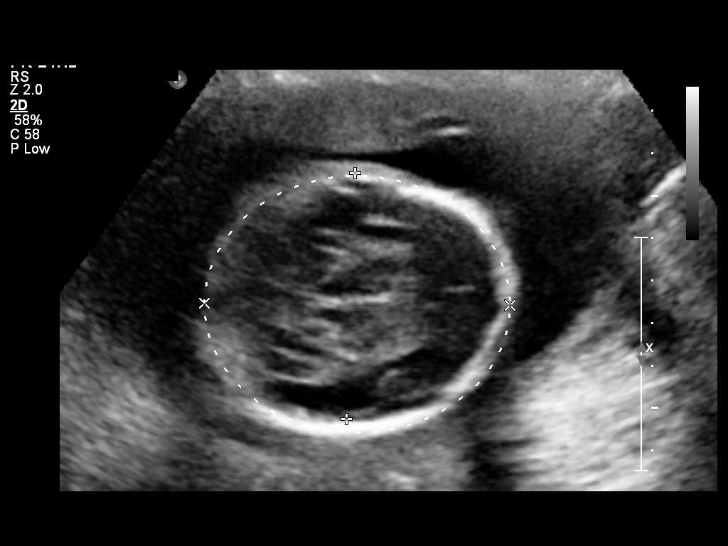
[im 27/72]
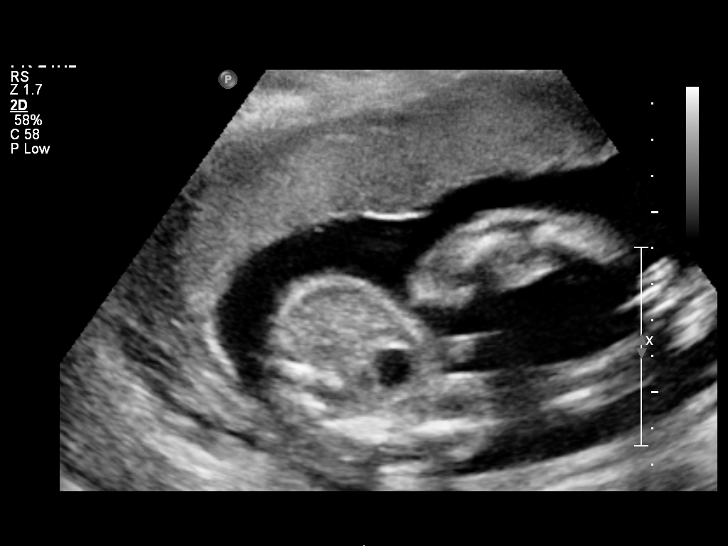
[im 32/72]
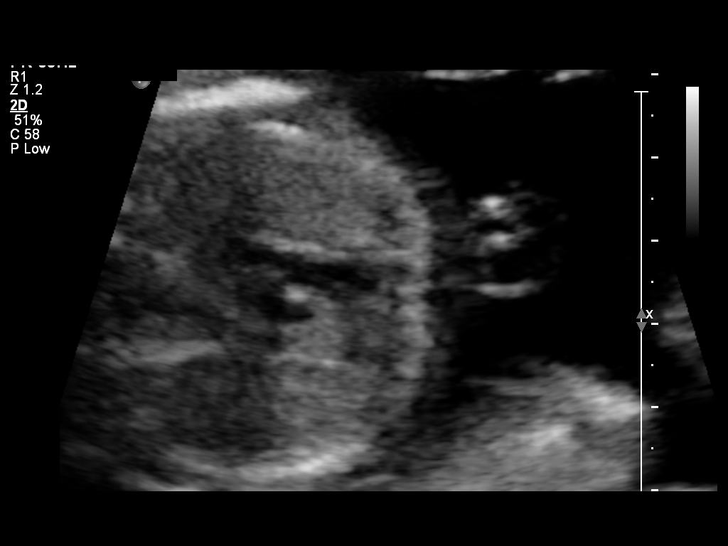
[im 40/72]
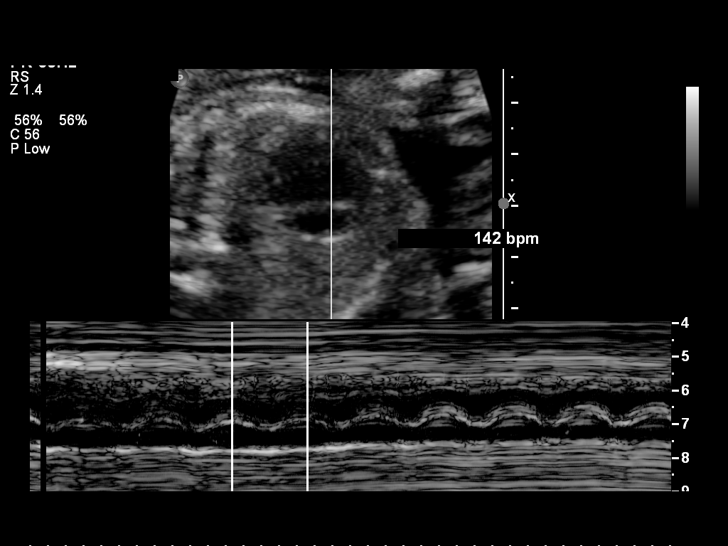
[im 45/72]
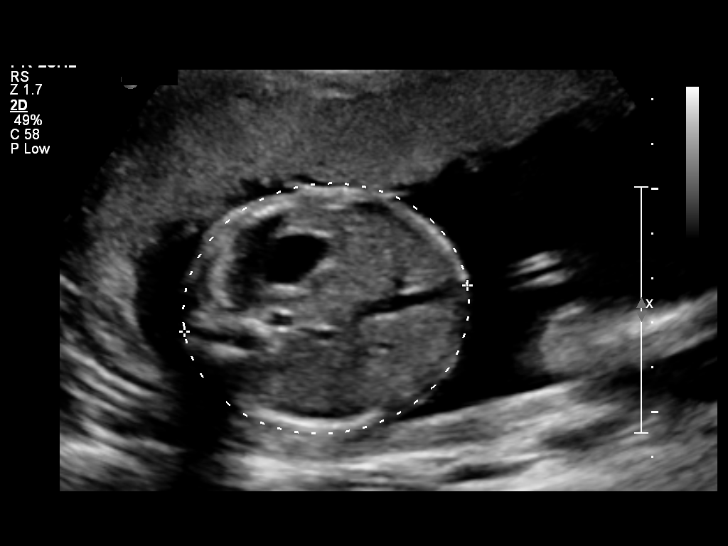
[im 50/72]
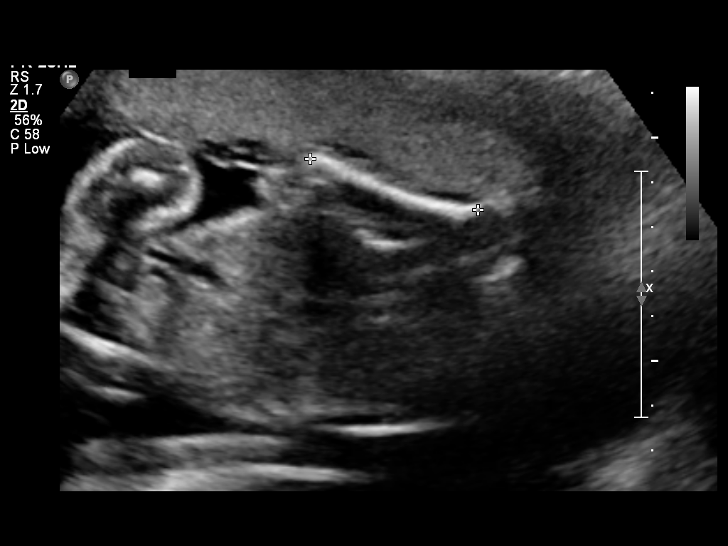
[im 58/72]
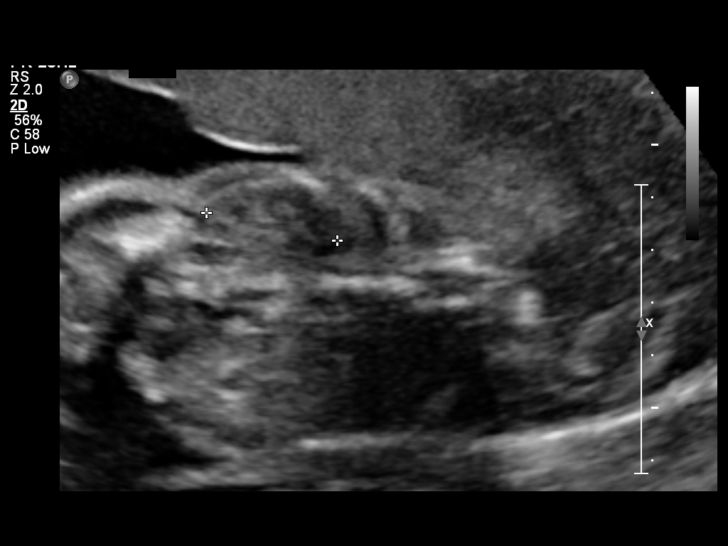
[im 64/72]
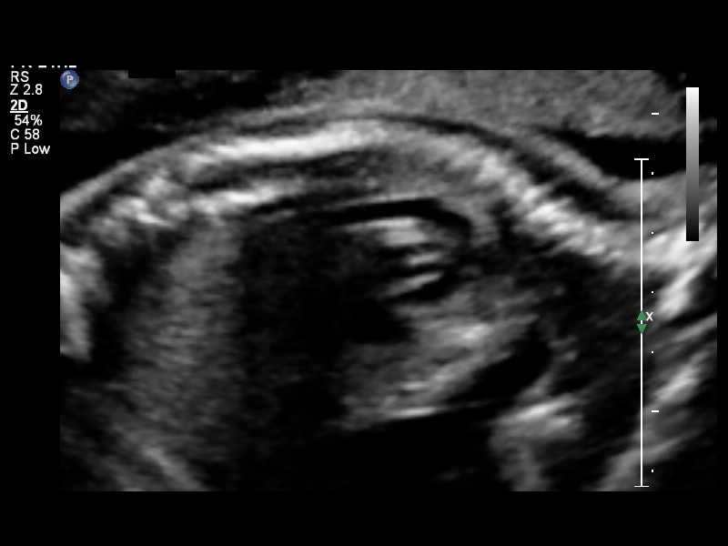
[im 69/72]
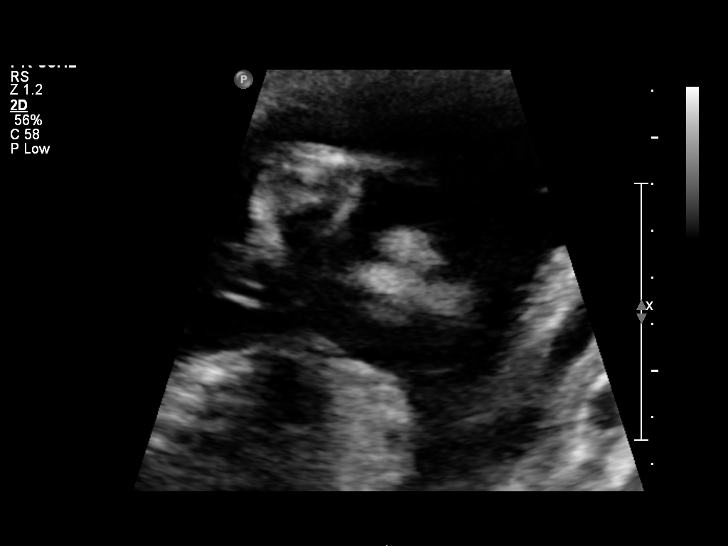

[12 of 28 positions shown; findings below may reference images not displayed]

OBSTETRICS REPORT
                      (Signed Final 02/16/2014 [DATE])

Service(s) Provided

 US OB FOLLOW UP                                       76816.1
Indications

 Follow-up incomplete fetal anatomic evaluation
 Previous cesarean section
Fetal Evaluation

 Num Of Fetuses:    1
 Fetal Heart Rate:  142                          bpm
 Cardiac Activity:  Observed
 Presentation:      Cephalic
 Placenta:          Anterior, above cervical os
 P. Cord            Visualized, central
 Insertion:

 Amniotic Fluid
 AFI FV:      Subjectively within normal limits
                                             Larg Pckt:     6.6  cm
Biometry

 BPD:     57.6  mm     G. Age:  23w 4d                CI:        73.36   70 - 86
                                                      FL/HC:      20.4   18.7 -

 HC:     213.7  mm     G. Age:  23w 3d       23  %    HC/AC:      1.13   1.05 -

 AC:     188.5  mm     G. Age:  23w 5d       39  %    FL/BPD:     75.7   71 - 87
 FL:      43.6  mm     G. Age:  24w 2d       57  %    FL/AC:      23.1   20 - 24
 HUM:     39.1  mm     G. Age:  24w 0d       47  %
 Est. FW:     636  gm      1 lb 6 oz     55  %
Gestational Age

 LMP:           22w 6d        Date:  09/09/13                 EDD:   06/16/14
 U/S Today:     23w 5d                                        EDD:   06/10/14
Anatomy

 Cranium:          Appears normal         Aortic Arch:      Appears normal
 Fetal Cavum:      Appears normal         Ductal Arch:      Appears normal
 Ventricles:       Appears normal         Diaphragm:        Appears normal
 Choroid Plexus:   Appears normal         Stomach:          Appears normal, left
                                                            sided
 Cerebellum:       Previously seen        Abdomen:          Appears normal
 Posterior Fossa:  Previously seen        Abdominal Wall:   Previously seen
 Nuchal Fold:      Not applicable (>20    Cord Vessels:     Previously seen
                   wks GA)
 Face:             Orbits and profile     Kidneys:          Appear normal
                   previously seen
 Lips:             Appears normal         Bladder:          Appears normal
 Heart:            Appears normal         Spine:            Previously seen
                   (4CH, axis, and
                   situs)
 RVOT:             Appears normal         Lower             Previously seen
                                          Extremities:
 LVOT:             Appears normal         Upper             Previously seen
                                          Extremities:

 Other:  Fetus appears to be a male.
Targeted Anatomy

 Fetal Central Nervous System
 Lat. Ventricles:
Cervix Uterus Adnexa

 Cervical Length:    5.62     cm

 Cervix:       Normal appearance by transabdominal scan.
 Uterus:       No abnormality visualized.
 Cul De Sac:   No free fluid seen.

 Left Ovary:    Not visualized.
 Right Ovary:   Not visualized.

 Adnexa:     No abnormality visualized.
Impression

 Single IUP at 23w 5d
 EFW 55th%
 Today's images effectively complete fetal survey without
 demonstration of apparent defect
 Normal amniotic fluid volume
Recommendations

 Follow up ultrasounds as clinically indicated.

## 2014-06-02 NOTE — Patient Instructions (Signed)
Etonogestrel implant °What is this medicine? °ETONOGESTREL (et oh noe JES trel) is a contraceptive (birth control) device. It is used to prevent pregnancy. It can be used for up to 3 years. °This medicine may be used for other purposes; ask your health care provider or pharmacist if you have questions. °COMMON BRAND NAME(S): Implanon, Nexplanon °What should I tell my health care provider before I take this medicine? °They need to know if you have any of these conditions: °-abnormal vaginal bleeding °-blood vessel disease or blood clots °-cancer of the breast, cervix, or liver °-depression °-diabetes °-gallbladder disease °-headaches °-heart disease or recent heart attack °-high blood pressure °-high cholesterol °-kidney disease °-liver disease °-renal disease °-seizures °-tobacco smoker °-an unusual or allergic reaction to etonogestrel, other hormones, anesthetics or antiseptics, medicines, foods, dyes, or preservatives °-pregnant or trying to get pregnant °-breast-feeding °How should I use this medicine? °This device is inserted just under the skin on the inner side of your upper arm by a health care professional. °Talk to your pediatrician regarding the use of this medicine in children. Special care may be needed. °Overdosage: If you think you've taken too much of this medicine contact a poison control center or emergency room at once. °Overdosage: If you think you have taken too much of this medicine contact a poison control center or emergency room at once. °NOTE: This medicine is only for you. Do not share this medicine with others. °What if I miss a dose? °This does not apply. °What may interact with this medicine? °Do not take this medicine with any of the following medications: °-amprenavir °-bosentan °-fosamprenavir °This medicine may also interact with the following medications: °-barbiturate medicines for inducing sleep or treating seizures °-certain medicines for fungal infections like ketoconazole and  itraconazole °-griseofulvin °-medicines to treat seizures like carbamazepine, felbamate, oxcarbazepine, phenytoin, topiramate °-modafinil °-phenylbutazone °-rifampin °-some medicines to treat HIV infection like atazanavir, indinavir, lopinavir, nelfinavir, tipranavir, ritonavir °-St. John's wort °This list may not describe all possible interactions. Give your health care provider a list of all the medicines, herbs, non-prescription drugs, or dietary supplements you use. Also tell them if you smoke, drink alcohol, or use illegal drugs. Some items may interact with your medicine. °What should I watch for while using this medicine? °This product does not protect you against HIV infection (AIDS) or other sexually transmitted diseases. °You should be able to feel the implant by pressing your fingertips over the skin where it was inserted. Tell your doctor if you cannot feel the implant. °What side effects may I notice from receiving this medicine? °Side effects that you should report to your doctor or health care professional as soon as possible: °-allergic reactions like skin rash, itching or hives, swelling of the face, lips, or tongue °-breast lumps °-changes in vision °-confusion, trouble speaking or understanding °-dark urine °-depressed mood °-general ill feeling or flu-like symptoms °-light-colored stools °-loss of appetite, nausea °-right upper belly pain °-severe headaches °-severe pain, swelling, or tenderness in the abdomen °-shortness of breath, chest pain, swelling in a leg °-signs of pregnancy °-sudden numbness or weakness of the face, arm or leg °-trouble walking, dizziness, loss of balance or coordination °-unusual vaginal bleeding, discharge °-unusually weak or tired °-yellowing of the eyes or skin °Side effects that usually do not require medical attention (Report these to your doctor or health care professional if they continue or are bothersome.): °-acne °-breast pain °-changes in  weight °-cough °-fever or chills °-headache °-irregular menstrual bleeding °-itching, burning, and   vaginal discharge °-pain or difficulty passing urine °-sore throat °This list may not describe all possible side effects. Call your doctor for medical advice about side effects. You may report side effects to FDA at 1-800-FDA-1088. °Where should I keep my medicine? °This drug is given in a hospital or clinic and will not be stored at home. °NOTE: This sheet is a summary. It may not cover all possible information. If you have questions about this medicine, talk to your doctor, pharmacist, or health care provider. °© 2015, [employer]vier/Gold Standard. (2012-03-31 15:37:45) ° °Contraception Choices °Contraception (birth control) is the use of any methods or devices to prevent pregnancy. Below are some methods to help avoid pregnancy. °HORMONAL METHODS  °· Contraceptive implant. This is a thin, plastic tube containing progesterone hormone. It does not contain estrogen hormone. Your health care provider inserts the tube in the inner part of the upper arm. The tube can remain in place for up to 3 years. After 3 years, the implant must be removed. The implant prevents the ovaries from releasing an egg (ovulation), thickens the cervical mucus to prevent sperm from entering the uterus, and thins the lining of the inside of the uterus. °· Progesterone-only injections. These injections are given every 3 months by your health care provider to prevent pregnancy. This synthetic progesterone hormone stops the ovaries from releasing eggs. It also thickens cervical mucus and changes the uterine lining. This makes it harder for sperm to survive in the uterus. °· Birth control pills. These pills contain estrogen and progesterone hormone. They work by preventing the ovaries from releasing eggs (ovulation). They also cause the cervical mucus to thicken, preventing the sperm from entering the uterus. Birth control pills are prescribed by a  health care provider. Birth control pills can also be used to treat heavy periods. °· Minipill. This type of birth control pill contains only the progesterone hormone. They are taken every day of each month and must be prescribed by your health care provider. °· Birth control patch. The patch contains hormones similar to those in birth control pills. It must be changed once a week and is prescribed by a health care provider. °· Vaginal ring. The ring contains hormones similar to those in birth control pills. It is left in the vagina for 3 weeks, removed for 1 week, and then a new one is put back in place. The patient must be comfortable inserting and removing the ring from the vagina. A health care provider's prescription is necessary. °· Emergency contraception. Emergency contraceptives prevent pregnancy after unprotected sexual intercourse. This pill can be taken right after sex or up to 5 days after unprotected sex. It is most effective the sooner you take the pills after having sexual intercourse. Most emergency contraceptive pills are available without a prescription. Check with your pharmacist. Do not use emergency contraception as your only form of birth control. °BARRIER METHODS  °· Female condom. This is a thin sheath (latex or rubber) that is worn over the penis during sexual intercourse. It can be used with spermicide to increase effectiveness. °· Female condom. This is a soft, loose-fitting sheath that is put into the vagina before sexual intercourse. °· Diaphragm. This is a soft, latex, dome-shaped barrier that must be fitted by a health care provider. It is inserted into the vagina, along with a spermicidal jelly. It is inserted before intercourse. The diaphragm should be left in the vagina for 6 to 8 hours after intercourse. °· Cervical cap. This is a round, soft, latex   or plastic cup that fits over the cervix and must be fitted by a health care provider. The cap can be left in place for up to 48 hours  after intercourse. °· Sponge. This is a soft, circular piece of polyurethane foam. The sponge has spermicide in it. It is inserted into the vagina after wetting it and before sexual intercourse. °· Spermicides. These are chemicals that kill or block sperm from entering the cervix and uterus. They come in the form of creams, jellies, suppositories, foam, or tablets. They do not require a prescription. They are inserted into the vagina with an applicator before having sexual intercourse. The process must be repeated every time you have sexual intercourse. °INTRAUTERINE CONTRACEPTION °· Intrauterine device (IUD). This is a T-shaped device that is put in a woman's uterus during a menstrual period to prevent pregnancy. There are 2 types: °¨ Copper IUD. This type of IUD is wrapped in copper wire and is placed inside the uterus. Copper makes the uterus and fallopian tubes produce a fluid that kills sperm. It can stay in place for 10 years. °¨ Hormone IUD. This type of IUD contains the hormone progestin (synthetic progesterone). The hormone thickens the cervical mucus and prevents sperm from entering the uterus, and it also thins the uterine lining to prevent implantation of a fertilized egg. The hormone can weaken or kill the sperm that get into the uterus. It can stay in place for 3-5 years, depending on which type of IUD is used. °PERMANENT METHODS OF CONTRACEPTION °· Female tubal ligation. This is when the woman's fallopian tubes are surgically sealed, tied, or blocked to prevent the egg from traveling to the uterus. °· Hysteroscopic sterilization. This involves placing a small coil or insert into each fallopian tube. Your doctor uses a technique called hysteroscopy to do the procedure. The device causes scar tissue to form. This results in permanent blockage of the fallopian tubes, so the sperm cannot fertilize the egg. It takes about 3 months after the procedure for the tubes to become blocked. You must use another  form of birth control for these 3 months. °· Female sterilization. This is when the female has the tubes that carry sperm tied off (vasectomy). This blocks sperm from entering the vagina during sexual intercourse. After the procedure, the man can still ejaculate fluid (semen). °NATURAL PLANNING METHODS °· Natural family planning. This is not having sexual intercourse or using a barrier method (condom, diaphragm, cervical cap) on days the woman could become pregnant. °· Calendar method. This is keeping track of the length of each menstrual cycle and identifying when you are fertile. °· Ovulation method. This is avoiding sexual intercourse during ovulation. °· Symptothermal method. This is avoiding sexual intercourse during ovulation, using a thermometer and ovulation symptoms. °· Post-ovulation method. This is timing sexual intercourse after you have ovulated. °Regardless of which type or method of contraception you choose, it is important that you use condoms to protect against the transmission of sexually transmitted infections (STIs). Talk with your health care provider about which form of contraception is most appropriate for you. °Document Released: 09/24/2005 Document Revised: 09/29/2013 Document Reviewed: 03/19/2013 °ExitCare® Patient Information ©2015 [employer]. This information is not intended to replace advice given to you by your health care provider. Make sure you discuss any questions you have with your health care provider. ° °

## 2014-06-02 NOTE — Progress Notes (Signed)
Doing well. Good FM. Occasional UCs. Had normal AFI x 2 and no longer in testing. 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 This is first boy: plans circ inpt if BC/BS pays. Short interval b/t pregnancies. Encouraged LARC.

## 2014-06-03 ENCOUNTER — Inpatient Hospital Stay (HOSPITAL_COMMUNITY)
Admission: AD | Admit: 2014-06-03 | Discharge: 2014-06-03 | Disposition: A | Discharge status: Home / Self Care | Payer: BC Managed Care – PPO | Source: Ambulatory Visit | Attending: Obstetrics & Gynecology | Admitting: Obstetrics & Gynecology

## 2014-06-03 ENCOUNTER — Encounter (HOSPITAL_COMMUNITY): Payer: Self-pay | Admitting: *Deleted

## 2014-06-03 DIAGNOSIS — N949 Unspecified condition associated with female genital organs and menstrual cycle: Secondary | ICD-10-CM | POA: Diagnosis not present

## 2014-06-03 DIAGNOSIS — O9989 Other specified diseases and conditions complicating pregnancy, childbirth and the puerperium: Secondary | ICD-10-CM

## 2014-06-03 DIAGNOSIS — O409XX0 Polyhydramnios, unspecified trimester, not applicable or unspecified: Secondary | ICD-10-CM | POA: Diagnosis not present

## 2014-06-03 DIAGNOSIS — Z22330 Carrier of Group B streptococcus: Secondary | ICD-10-CM | POA: Insufficient documentation

## 2014-06-03 DIAGNOSIS — O479 False labor, unspecified: Secondary | ICD-10-CM | POA: Insufficient documentation

## 2014-06-03 DIAGNOSIS — O403XX1 Polyhydramnios, third trimester, fetus 1: Secondary | ICD-10-CM

## 2014-06-03 DIAGNOSIS — O26893 Other specified pregnancy related conditions, third trimester: Secondary | ICD-10-CM

## 2014-06-03 DIAGNOSIS — O99891 Other specified diseases and conditions complicating pregnancy: Secondary | ICD-10-CM | POA: Insufficient documentation

## 2014-06-03 DIAGNOSIS — O99820 Streptococcus B carrier state complicating pregnancy: Secondary | ICD-10-CM

## 2014-06-03 DIAGNOSIS — R102 Pelvic and perineal pain: Secondary | ICD-10-CM

## 2014-06-03 NOTE — MAU Note (Signed)
PT SAYS SHE STARTED HURTING BAD AT 5 PM.  HAS HAD LOOSE BM  ALL DAY.      VE IN CLINIC- LAST WEEK-   1-2 CM.   DENIES HSV AND MRSA.

## 2014-06-03 NOTE — Discharge Instructions (Signed)
Braxton Hicks Contractions °Contractions of the uterus can occur throughout pregnancy. Contractions are not always a sign that you are in labor.  °WHAT ARE BRAXTON HICKS CONTRACTIONS?  °Contractions that occur before labor are called Braxton Hicks contractions, or false labor. Toward the end of pregnancy (32-34 weeks), these contractions can develop more often and may become more forceful. This is not true labor because these contractions do not result in opening (dilatation) and thinning of the cervix. They are sometimes difficult to tell apart from true labor because these contractions can be forceful and people have different pain tolerances. You should not feel embarrassed if you go to the hospital with false labor. Sometimes, the only way to tell if you are in true labor is for your health care provider to look for changes in the cervix. °If there are no prenatal problems or other health problems associated with the pregnancy, it is completely safe to be sent home with false labor and await the onset of true labor. °HOW CAN YOU TELL THE DIFFERENCE BETWEEN TRUE AND FALSE LABOR? °False Labor °· The contractions of false labor are usually shorter and not as hard as those of true labor.   °· The contractions are usually irregular.   °· The contractions are often felt in the front of the lower abdomen and in the groin.   °· The contractions may go away when you walk around or change positions while lying down.   °· The contractions get weaker and are shorter lasting as time goes on.   °· The contractions do not usually become progressively stronger, regular, and closer together as with true labor.   °True Labor °· Contractions in true labor last 30-70 seconds, become very regular, usually become more intense, and increase in frequency.   °· The contractions do not go away with walking.   °· The discomfort is usually felt in the top of the uterus and spreads to the lower abdomen and low back.   °· True labor can be  determined by your health care provider with an exam. This will show that the cervix is dilating and getting thinner.   °WHAT TO REMEMBER °· Keep up with your usual exercises and follow other instructions given by your health care provider.   °· Take medicines as directed by your health care provider.   °· Keep your regular prenatal appointments.   °· Eat and drink lightly if you think you are going into labor.   °· If Braxton Hicks contractions are making you uncomfortable:   °¨ Change your position from lying down or resting to walking, or from walking to resting.   °¨ Sit and rest in a tub of warm water.   °¨ Drink 2-3 glasses of water. Dehydration may cause these contractions.   °¨ Do slow and deep breathing several times an hour.   °WHEN SHOULD I SEEK IMMEDIATE MEDICAL CARE? °Seek immediate medical care if: °· Your contractions become stronger, more regular, and closer together.   °· You have fluid leaking or gushing from your vagina.   °· You have a fever.   °· You pass blood-tinged mucus.   °· You have vaginal bleeding.   °· You have continuous abdominal pain.   °· You have low back pain that you never had before.   °· You feel your baby's head pushing down and causing pelvic pressure.   °· Your baby is not moving as much as it used to.   °Document Released: 09/24/2005 Document Revised: 09/29/2013 Document Reviewed: 07/06/2013 °ExitCare® Patient Information ©2015 [employer]. This information is not intended to replace advice given to you by your health care   provider. Make sure you discuss any questions you have with your health care provider. ° °

## 2014-06-03 NOTE — MAU Provider Note (Signed)
Obstetric Attending MAU Note  Chief Complaint:  No chief complaint on file.   First Provider Initiated Contact with Patient 06/03/14 2121     HPI: Cathy Webster is a 23 y.o. G3P2002 at [redacted]w[redacted]d who presents to MAU reporting irregular contractions and pelvic pressure. Denies leakage of fluid or vaginal bleeding. Good fetal movement.   Pregnancy Course: Receives care at [facility]  Patient Active Problem List   Diagnosis Date Noted  . Group B Streptococcus carrier, +RV culture, currently pregnant 05/19/2014  . Polyhydramnios in third trimester 05/09/2014  . Pelvic pain affecting pregnancy in third trimester, antepartum 05/05/2014  . History of cesarean section, low transverse 03/16/2014  . Abnormal Pap smear of cervix 12/22/2013  . First trimester screening 12/16/2013  . Supervision of normal pregnancy 11/24/2013  . Previous cesarean delivery, delivered, with or without mention of antepartum condition 11/24/2013  . Short interval between pregnancies complicating pregnancy, antepartum 11/24/2013    Past Medical History  Diagnosis Date  . Abnormal Pap smear 2008    OB History  Gravida Para Term Preterm AB SAB TAB Ectopic Multiple Living  # Outcome Date GA Lbr Len/2nd Weight Sex Delivery Anes PTL Lv  3 CUR           2 TRM 03/24/13 [redacted]w[redacted]d 06:25 / 00:13 6 lb 10.9 oz (3.03 kg) F VBAC EPI  Y  1 TRM 08/07/08 [redacted]w[redacted]d  6 lb 14 oz (3.118 kg) F CS EPI  Y      Past Surgical History  Procedure Laterality Date  . Cesarean section    . Tonsillectomy      Family History: Family History  Problem Relation Age of Onset  . Diabetes Mother   . Hypertension Mother   . Asthma Brother     Social History: History  Substance Use Topics  . Smoking status: Never Smoker   . Smokeless tobacco: Never Used  . Alcohol Use: No    Allergies: No Known Allergies  Prescriptions prior to admission  Medication Sig Dispense Refill  . Prenatal Vit-Fe Fumarate-FA (PRENATAL  MULTIVITAMIN) TABS tablet Take 1 tablet by mouth at bedtime.  30 tablet  9    ROS: Pertinent findings in history of present illness.  Physical Exam  Blood pressure 132/71, pulse 111, temperature 98.1 F (36.7 C), temperature source Oral, resp. rate 20, height  (1.6 m), weight 288 lb 8 oz (130.863 kg), last menstrual period 09/09/2013. GENERAL: Well-developed, well-nourished female in no acute distress.  HEENT: Normocephalic, atraumatic HEART: Regular rate and regular RESP: Normal effort, no breathing difficult ABDOMEN: Soft, non-tender, gravid appropriate for gestational age EXTREMITIES: Nontender, no edema NEURO: Alert and oriented SPECULUM EXAM: NEFG, physiologic discharge, no blood, cervix clean Dilation: 1.5 Effacement (%): Thick Cervical Position: Posterior Station: Ballotable Exam by:: Rehanna Oloughlin MD (unchanged from clinic exam a week ago)  FHT:  Baseline 150 , moderate variability, accelerations present, no decelerations Contractions: q 3-4 mins   Assessment: 1. False labor   2. Group B Streptococcus carrier, +RV culture, currently pregnant   3. Polyhydramnios in third trimester, fetus 1   4. Pelvic pain affecting pregnancy in third trimester, antepartum     Plan: Discharge home Labor precautions and fetal kick counts reviewed Follow up with OB provider at [facility]      Medication List         prenatal multivitamin Tabs tablet  Take 1 tablet by mouth at bedtime.  Tereso Newcomer, MD 06/03/2014 9:47 PM

## 2014-06-07 ENCOUNTER — Inpatient Hospital Stay (HOSPITAL_COMMUNITY)
Admission: AD | Admit: 2014-06-07 | Discharge: 2014-06-07 | Disposition: A | Discharge status: Home / Self Care | Payer: BC Managed Care – PPO | Source: Ambulatory Visit | Attending: Obstetrics and Gynecology | Admitting: Obstetrics and Gynecology

## 2014-06-07 ENCOUNTER — Encounter (HOSPITAL_COMMUNITY): Payer: Self-pay | Admitting: *Deleted

## 2014-06-07 DIAGNOSIS — O409XX0 Polyhydramnios, unspecified trimester, not applicable or unspecified: Secondary | ICD-10-CM | POA: Insufficient documentation

## 2014-06-07 DIAGNOSIS — O26859 Spotting complicating pregnancy, unspecified trimester: Secondary | ICD-10-CM | POA: Diagnosis not present

## 2014-06-07 DIAGNOSIS — O479 False labor, unspecified: Secondary | ICD-10-CM | POA: Insufficient documentation

## 2014-06-07 DIAGNOSIS — Z3490 Encounter for supervision of normal pregnancy, unspecified, unspecified trimester: Secondary | ICD-10-CM

## 2014-06-07 NOTE — MAU Provider Note (Cosign Needed)
  History     CSN: [id number]  Arrival date and time: 06/07/14 1438   None     Chief Complaint  Patient presents with  . Vaginal Bleeding   HPI 23 year old G3P2002 at 72.4 who presents to MAU for evaluation of vaginal spotting starting today. Pregnancy complicated by polyhydramnios. Over the weekend reports regular contractions that stopped on own. This morning noted scant amount of blood mixed with mucus like discharge. +Fetal movement. Denies contractions presently. Denies loss of fluid.   OB History   Grav Para Term Preterm Abortions TAB SAB Ect Mult Living   Past Medical History  Diagnosis Date  . Abnormal Pap smear 2008    Past Surgical History  Procedure Laterality Date  . Cesarean section    . Tonsillectomy      Family History  Problem Relation Age of Onset  . Diabetes Mother   . Hypertension Mother   . Asthma Brother     History  Substance Use Topics  . Smoking status: Never Smoker   . Smokeless tobacco: Never Used  . Alcohol Use: No    Allergies: No Known Allergies  Prescriptions prior to admission  Medication Sig Dispense Refill  . Prenatal Vit-Fe Fumarate-FA (PRENATAL MULTIVITAMIN) TABS tablet Take 1 tablet by mouth at bedtime.  30 tablet  9    Review of Systems  Constitutional: Negative.   HENT: Negative.   Respiratory: Negative.   Cardiovascular: Negative.   Gastrointestinal: Negative.   Genitourinary: Negative.   Musculoskeletal: Negative.   Neurological: Negative.   All other systems reviewed and are negative.  Physical Exam   Blood pressure 129/66, pulse 102, temperature 98.1 F (36.7 C), temperature source Oral, resp. rate 18, height  (1.6 m), weight 289 lb (131.09 kg), last menstrual period 09/09/2013, SpO2 100.00%.  Physical Exam  Nursing note and vitals reviewed. Constitutional: She is oriented to person, place, and time. She appears well-developed and well-nourished. No distress.  HENT:  Head:  Normocephalic and atraumatic.  Eyes: Conjunctivae are normal.  Neck: Neck supple.  Cardiovascular: Normal rate.   Respiratory: Effort normal.  GI: Soft. Bowel sounds are normal. She exhibits no distension. There is no tenderness. There is no rebound and no guarding.  Genitourinary: Vagina normal. No vaginal discharge found.  Sterile speculum exam: No blood in vaginal vault. Thick mucus from os with small streak of red. SVE: 2-3/25/-3  Neurological: She is alert and oriented to person, place, and time.  Psychiatric: She has a normal mood and affect. Her behavior is normal.    MAU Course  Procedures  MDM NST reactive   Assessment and Plan  23 year old G3P2002 at 39.4 in early prodromal labor with loss of mucus plug. Scant amount of blood in mucus likely from loss of mucus plug with cervical change. No bright red or old blood noted in the vault. NST reactive. Vital signs stable.  - Safe for discharge to home with early labor precautions.  - RTC on Thursday as scheduled.  - Seek care immediately if increase in bleeding.   Cathy Webster 06/07/2014, 3:36 PM

## 2014-06-07 NOTE — Discharge Instructions (Signed)
You are in early labor and have lost your mucus plug. You are not in active labor. You should be seen in clinic on Thursday as scheduled. Please seek care sooner if you develop vaginal bleeding, regular contractions, break your water or stop feeling your baby move.

## 2014-06-07 NOTE — MAU Note (Signed)
Patient presents to MAU with c/o vaginal bleeding; states notices bleeding and a few clots when wipes. Not currently wearing a pad. Denies LOF or discharge at this time. +FM.

## 2014-06-08 ENCOUNTER — Encounter (HOSPITAL_COMMUNITY): Payer: BC Managed Care – PPO | Admitting: Anesthesiology

## 2014-06-08 ENCOUNTER — Inpatient Hospital Stay (HOSPITAL_COMMUNITY): Payer: BC Managed Care – PPO | Admitting: Anesthesiology

## 2014-06-08 ENCOUNTER — Encounter (HOSPITAL_COMMUNITY): Payer: Self-pay | Admitting: *Deleted

## 2014-06-08 ENCOUNTER — Inpatient Hospital Stay (HOSPITAL_COMMUNITY)
Admission: AD | Admit: 2014-06-08 | Discharge: 2014-06-10 | DRG: 775 | Disposition: A | Discharge status: Home / Self Care | Payer: BC Managed Care – PPO | Source: Ambulatory Visit | Attending: Family Medicine | Admitting: Family Medicine

## 2014-06-08 DIAGNOSIS — Z6841 Body Mass Index (BMI) 40.0 and over, adult: Secondary | ICD-10-CM

## 2014-06-08 DIAGNOSIS — O26893 Other specified pregnancy related conditions, third trimester: Secondary | ICD-10-CM

## 2014-06-08 DIAGNOSIS — Z3490 Encounter for supervision of normal pregnancy, unspecified, unspecified trimester: Secondary | ICD-10-CM

## 2014-06-08 DIAGNOSIS — R102 Pelvic and perineal pain: Secondary | ICD-10-CM

## 2014-06-08 DIAGNOSIS — O34219 Maternal care for unspecified type scar from previous cesarean delivery: Secondary | ICD-10-CM | POA: Diagnosis present

## 2014-06-08 DIAGNOSIS — Z22330 Carrier of Group B streptococcus: Secondary | ICD-10-CM | POA: Diagnosis not present

## 2014-06-08 DIAGNOSIS — O99820 Streptococcus B carrier state complicating pregnancy: Secondary | ICD-10-CM

## 2014-06-08 DIAGNOSIS — O9989 Other specified diseases and conditions complicating pregnancy, childbirth and the puerperium: Secondary | ICD-10-CM

## 2014-06-08 DIAGNOSIS — O99214 Obesity complicating childbirth: Secondary | ICD-10-CM

## 2014-06-08 DIAGNOSIS — O99892 Other specified diseases and conditions complicating childbirth: Secondary | ICD-10-CM | POA: Diagnosis present

## 2014-06-08 DIAGNOSIS — Z825 Family history of asthma and other chronic lower respiratory diseases: Secondary | ICD-10-CM

## 2014-06-08 DIAGNOSIS — O479 False labor, unspecified: Secondary | ICD-10-CM | POA: Diagnosis present

## 2014-06-08 DIAGNOSIS — E669 Obesity, unspecified: Secondary | ICD-10-CM | POA: Diagnosis present

## 2014-06-08 DIAGNOSIS — O09899 Supervision of other high risk pregnancies, unspecified trimester: Secondary | ICD-10-CM | POA: Diagnosis not present

## 2014-06-08 DIAGNOSIS — Z8249 Family history of ischemic heart disease and other diseases of the circulatory system: Secondary | ICD-10-CM

## 2014-06-08 DIAGNOSIS — Z833 Family history of diabetes mellitus: Secondary | ICD-10-CM

## 2014-06-08 DIAGNOSIS — Z98891 History of uterine scar from previous surgery: Secondary | ICD-10-CM

## 2014-06-08 DIAGNOSIS — O403XX1 Polyhydramnios, third trimester, fetus 1: Secondary | ICD-10-CM

## 2014-06-08 DIAGNOSIS — O409XX0 Polyhydramnios, unspecified trimester, not applicable or unspecified: Secondary | ICD-10-CM | POA: Diagnosis not present

## 2014-06-08 LAB — CBC
HCT: 35.9 % — ABNORMAL LOW (ref 36.0–46.0)
Hemoglobin: 12.2 g/dL (ref 12.0–15.0)
MCH: 28.2 pg (ref 26.0–34.0)
MCHC: 34 g/dL (ref 30.0–36.0)
MCV: 83.1 fL (ref 78.0–100.0)
PLATELETS: 155 10*3/uL (ref 150–400)
RBC: 4.32 MIL/uL (ref 3.87–5.11)
RDW: 14 % (ref 11.5–15.5)
WBC: 7.3 10*3/uL (ref 4.0–10.5)

## 2014-06-08 LAB — OB RESULTS CONSOLE GBS: GBS: POSITIVE

## 2014-06-08 MED ORDER — LIDOCAINE HCL (PF) 1 % IJ SOLN
30.0000 mL | INTRAMUSCULAR | Status: DC | PRN
Start: 2014-06-08 — End: 2014-06-09
  Administered 2014-06-09: 30 mL via SUBCUTANEOUS
  Filled 2014-06-08: qty 30

## 2014-06-08 MED ORDER — FLEET ENEMA 7-19 GM/118ML RE ENEM: 1.0000 | ENEMA | RECTAL | Status: DC | PRN

## 2014-06-08 MED ORDER — SODIUM CHLORIDE 0.9 % IV SOLN
2.0000 g | Freq: Once | INTRAVENOUS | Status: AC
  Administered 2014-06-08: 2 g via INTRAVENOUS
  Filled 2014-06-08: qty 2000

## 2014-06-08 MED ORDER — LACTATED RINGERS IV SOLN
INTRAVENOUS | Status: DC
  Administered 2014-06-08: 19:00:00 via INTRAVENOUS

## 2014-06-08 MED ORDER — PHENYLEPHRINE 40 MCG/ML (10ML) SYRINGE FOR IV PUSH (FOR BLOOD PRESSURE SUPPORT)
80.0000 ug | PREFILLED_SYRINGE | INTRAVENOUS | Status: DC | PRN
  Filled 2014-06-08: qty 2

## 2014-06-08 MED ORDER — DIPHENHYDRAMINE HCL 50 MG/ML IJ SOLN: 12.5000 mg | INTRAMUSCULAR | Status: DC | PRN

## 2014-06-08 MED ORDER — FENTANYL CITRATE 0.05 MG/ML IJ SOLN
INTRAMUSCULAR | Status: AC
  Filled 2014-06-08: qty 2

## 2014-06-08 MED ORDER — ACETAMINOPHEN 325 MG PO TABS: 650.0000 mg | ORAL_TABLET | ORAL | Status: DC | PRN

## 2014-06-08 MED ORDER — FENTANYL CITRATE 0.05 MG/ML IJ SOLN
100.0000 ug | Freq: Once | INTRAMUSCULAR | Status: AC
  Administered 2014-06-08: 100 ug via INTRAVENOUS

## 2014-06-08 MED ORDER — LACTATED RINGERS IV SOLN
500.0000 mL | INTRAVENOUS | Status: DC | PRN
  Administered 2014-06-08: 500 mL via INTRAVENOUS

## 2014-06-08 MED ORDER — FENTANYL 2.5 MCG/ML BUPIVACAINE 1/10 % EPIDURAL INFUSION (WH - ANES)
14.0000 mL/h | INTRAMUSCULAR | Status: DC | PRN
  Filled 2014-06-08: qty 125

## 2014-06-08 MED ORDER — OXYCODONE-ACETAMINOPHEN 5-325 MG PO TABS: 1.0000 | ORAL_TABLET | ORAL | Status: DC | PRN

## 2014-06-08 MED ORDER — EPHEDRINE 5 MG/ML INJ
10.0000 mg | INTRAVENOUS | Status: DC | PRN
  Filled 2014-06-08: qty 2

## 2014-06-08 MED ORDER — OXYCODONE-ACETAMINOPHEN 5-325 MG PO TABS: 2.0000 | ORAL_TABLET | ORAL | Status: DC | PRN

## 2014-06-08 MED ORDER — OXYTOCIN BOLUS FROM INFUSION
500.0000 mL | INTRAVENOUS | Status: DC
  Administered 2014-06-09: 500 mL via INTRAVENOUS

## 2014-06-08 MED ORDER — FENTANYL 2.5 MCG/ML BUPIVACAINE 1/10 % EPIDURAL INFUSION (WH - ANES)
14.0000 mL/h | INTRAMUSCULAR | Status: DC | PRN
  Administered 2014-06-08: 14 mL/h via EPIDURAL

## 2014-06-08 MED ORDER — OXYTOCIN 40 UNITS IN LACTATED RINGERS INFUSION - SIMPLE MED
62.5000 mL/h | INTRAVENOUS | Status: DC
  Filled 2014-06-08: qty 1000

## 2014-06-08 MED ORDER — CITRIC ACID-SODIUM CITRATE 334-500 MG/5ML PO SOLN: 30.0000 mL | ORAL | Status: DC | PRN

## 2014-06-08 MED ORDER — LIDOCAINE HCL (PF) 1 % IJ SOLN
INTRAMUSCULAR | Status: DC | PRN
  Administered 2014-06-08: 10 mL

## 2014-06-08 MED ORDER — PHENYLEPHRINE 40 MCG/ML (10ML) SYRINGE FOR IV PUSH (FOR BLOOD PRESSURE SUPPORT)
80.0000 ug | PREFILLED_SYRINGE | INTRAVENOUS | Status: DC | PRN
  Filled 2014-06-08: qty 10
  Filled 2014-06-08: qty 2

## 2014-06-08 MED ORDER — LACTATED RINGERS IV SOLN
500.0000 mL | Freq: Once | INTRAVENOUS | Status: AC
  Administered 2014-06-08: 500 mL via INTRAVENOUS

## 2014-06-08 MED ORDER — ONDANSETRON HCL 4 MG/2ML IJ SOLN
4.0000 mg | Freq: Four times a day (QID) | INTRAMUSCULAR | Status: DC | PRN
Start: 2014-06-08 — End: 2014-06-09

## 2014-06-08 NOTE — Anesthesia Preprocedure Evaluation (Signed)

## 2014-06-08 NOTE — H&P (Signed)
LABOR ADMISSION HISTORY AND PHYSICAL  Cathy Webster is a 23 y.o. female G3P2002 with IUP at [redacted]w[redacted]d by 13 wk sono presenting for labor eval. Pt presents w/ contractions. She reports +FMs, No LOF, no VB, no blurry vision, headaches or peripheral edema, and RUQ pain. She desires an epidural for labor pain control. She plans on breast and bottle feeding. She request mini-pil for birth control.   Dating: By 13 wk sono--> Estimated Date of Delivery: 06/10/14   Prenatal History/Complications:  Past Medical History: Past Medical History  Diagnosis Date  . Abnormal Pap smear 2008    Past Surgical History: Past Surgical History  Procedure Laterality Date  . Cesarean section    . Tonsillectomy      Obstetrical History: OB History   Grav Para Term Preterm Abortions TAB SAB Ect Mult Living   Social History: History   Social History  . Marital Status: Single    Spouse Name: N/A    Number of Children: N/A  . Years of Education: N/A   Social History Main Topics  . Smoking status: Never Smoker   . Smokeless tobacco: Never Used  . Alcohol Use: No  . Drug Use: No  . Sexual Activity: No   Other Topics Concern  . None   Social History Narrative  . None    Family History: Family History  Problem Relation Age of Onset  . Diabetes Mother   . Hypertension Mother   . Asthma Brother     Allergies: No Known Allergies  Prescriptions prior to admission  Medication Sig Dispense Refill  . Prenatal Vit-Fe Fumarate-FA (PRENATAL MULTIVITAMIN) TABS tablet Take 1 tablet by mouth at bedtime.  30 tablet  9     Review of Systems   All systems reviewed and negative except as stated in HPI  Blood pressure 148/78, pulse 104, resp. rate 18, height  (1.676 m), weight 282 lb (127.914 kg), last menstrual period 09/09/2013. General appearance: alert and mild distress Lungs: clear to auscultation bilaterally Heart: regular rate and rhythm Abdomen: soft,  non-tender; bowel sounds normal Pelvic: SVE 6-7/80/-2, bulging bag Extremities: Homans sign is negative, no sign of DVT DTR's not examined Presentation: cephalic Fetal monitoringBaseline: 150 bpm, Variability: Good {> 6 bpm), Accelerations: Reactive and Decelerations: Absent Uterine activityFrequency: Every 3-4 minutes Dilation: 6.5 Effacement (%): 90 Station: -2 Exam by:: dr Karie Mainland   Prenatal labs: ABO, Rh: O/POS/-- (01/22 1625) Antibody: NEG (01/22 1625) Rubella:   RPR: NON REAC (06/09 1652)  HBsAg: NEGATIVE (01/22 1625)  HIV: NONREACTIVE (06/09 1652)  GBS: Positive (09/01 0000)  1 hr Glucola 65 Genetic screening Normal NT Anatomy US  Normal    Clinic [facility]  Dating LMP: 09/09/2013        Ultrasound:   weeks        Ultrasound consistent with LMP: Yes/No  Genetic Screen 1 Screen:  Normal NT               AFP:                 NIPS:  Anatomic [place] Nl, F/U 4-6 wk to eval heart, f/u wnl  GTT Early:               Third trimester: 45  TDaP vaccine  declined  Flu vaccine  declined  GBS Positive  Baby Food  breast  Contraception  OCP  Circumcision   Pediatrician  Support Person FOB and mother     Results for orders placed during the hospital encounter of 06/08/14 (from the past 24 hour(s))  OB RESULTS CONSOLE GBS   Collection Time    06/08/14 12:00 AM      Result Value Ref Range   GBS Positive      Patient Active Problem List   Diagnosis Date Noted  . Normal pregnancy 06/08/2014  . Group B Streptococcus carrier, +RV culture, currently pregnant 05/19/2014  . Polyhydramnios in third trimester 05/09/2014  . Pelvic pain affecting pregnancy in third trimester, antepartum 05/05/2014  . History of cesarean section, low transverse 03/16/2014  . Abnormal Pap smear of cervix 12/22/2013  . First trimester screening 12/16/2013  . Supervision of normal pregnancy 11/24/2013  . Previous cesarean delivery, delivered, with or without mention of antepartum condition 11/24/2013  . Short  interval between pregnancies complicating pregnancy, antepartum 11/24/2013    Assessment: Cathy Webster is a 23 y.o. G3P2002 at [redacted]w[redacted]d by 13wk sono  here for labor evaluation  #Labor: Active labor. Admit for expectant management. #Pain: Desires epidural. #FWB: Category I strip. #ID:  Start ampicillin for GBS prophylaxis #MOF: breast/bottle #MOC: OCPs. #Circ:  circ as outpatient   Zyeir Dymek ROCIO 06/08/2014, 7:28 PM

## 2014-06-08 NOTE — Anesthesia Procedure Notes (Signed)
Epidural Patient location during procedure: OB  Preanesthetic Checklist Completed: patient identified, site marked, surgical consent, pre-op evaluation, timeout performed, IV checked, risks and benefits discussed and monitors and equipment checked  Epidural Patient position: sitting Prep: site prepped and draped and DuraPrep Patient monitoring: continuous pulse ox and blood pressure Approach: midline Injection technique: LOR air  Needle:  Needle type: Tuohy  Needle gauge: 17 G Needle length: 9 cm and 9 Needle insertion depth: 7 cm Catheter type: closed end flexible Catheter size: 19 Gauge Catheter at skin depth: 14 cm Test dose: negative  Assessment Events: blood not aspirated, injection not painful, no injection resistance, negative IV test and no paresthesia  Additional Notes Dosing of Epidural:  1st dose, through catheter .............................................  Xylocaine 40 mg  2nd dose, through catheter, after waiting 3 minutes.........Xylocaine 60 mg    ( 1% Xylo charted as a single dose in Epic Meds for ease of charting; actual dosing was fractionated as above, for saftey's sake)  As each dose occurred, patient was free of IV sx; and patient exhibited no evidence of SA injection.  Patient is more comfortable after epidural dosed. Please see RN's note for documentation of vital signs,and FHR which are stable.  Patient reminded not to try to ambulate with numb legs, and that an RN must be present when she attempts to get up.       

## 2014-06-08 NOTE — OB Triage Note (Signed)
Pt presents uncomfortable with contractions every 5 minutes x 4 hours

## 2014-06-08 NOTE — H&P (Signed)
Attestation of Attending Supervision of Advanced Practitioner (CNM/NP): Evaluation and management procedures were performed by the Advanced Practitioner under my supervision and collaboration.  I have reviewed the Advanced Practitioner's note and chart, and I agree with the management and plan.  HARRAWAY-SMITH, Jamerion Cabello 10:13 PM     

## 2014-06-08 NOTE — Progress Notes (Signed)
   Cathy Webster is a 23 y.o. G3P2002 at [redacted]w[redacted]d  admitted for SOL  Subjective: Tolerating pain with epidural. No pressure currently  Objective: Filed Vitals:   06/08/14 2042 06/08/14 2102 06/08/14 2132 06/08/14 2232  BP: 89/77 94/54 118/74 102/49  Pulse: 105 97 99 100  Temp:      TempSrc:      Resp: Height:      Weight:      SpO2:          FHT:  FHR: 150 bpm, variability: moderate,  accelerations:  Present,  decelerations:  Absent UC:   irregular, every 4-6 minutes SVE:   Dilation: 7 Effacement (%): 90 Station: -3 Exam by:: Ace Gins, RN  Labs: Lab Results  Component Value Date   WBC 7.3 06/08/2014   HGB 12.2 06/08/2014   HCT 35.9* 06/08/2014   MCV 83.1 06/08/2014   PLT 155 06/08/2014    Assessment / Plan: Spontaneous labor, progressing normally  Labor: Progressing normally AROM (clear fluid) at 11:15pm Fetal Wellbeing:  Category I Pain Control:  Epidural Anticipated MOD:  NSVD  Leonides Schanz, Roslin Norwood S 06/08/2014, 11:15 PM

## 2014-06-09 ENCOUNTER — Encounter (HOSPITAL_COMMUNITY): Payer: Self-pay | Admitting: *Deleted

## 2014-06-09 DIAGNOSIS — O34219 Maternal care for unspecified type scar from previous cesarean delivery: Secondary | ICD-10-CM

## 2014-06-09 DIAGNOSIS — O409XX0 Polyhydramnios, unspecified trimester, not applicable or unspecified: Secondary | ICD-10-CM

## 2014-06-09 DIAGNOSIS — O09899 Supervision of other high risk pregnancies, unspecified trimester: Secondary | ICD-10-CM

## 2014-06-09 LAB — TYPE AND SCREEN
ABO/RH(D): O POS
Antibody Screen: NEGATIVE

## 2014-06-09 LAB — RPR

## 2014-06-09 MED ORDER — ZOLPIDEM TARTRATE 5 MG PO TABS: 5.0000 mg | ORAL_TABLET | Freq: Every evening | ORAL | Status: DC | PRN

## 2014-06-09 MED ORDER — PRENATAL MULTIVITAMIN CH
1.0000 | ORAL_TABLET | Freq: Every day | ORAL | Status: DC
  Administered 2014-06-09 – 2014-06-10 (×2): 1 via ORAL
  Filled 2014-06-09 (×2): qty 1

## 2014-06-09 MED ORDER — ONDANSETRON HCL 4 MG/2ML IJ SOLN: 4.0000 mg | INTRAMUSCULAR | Status: DC | PRN

## 2014-06-09 MED ORDER — BENZOCAINE-MENTHOL 20-0.5 % EX AERO
1.0000 "application " | INHALATION_SPRAY | CUTANEOUS | Status: DC | PRN
  Administered 2014-06-09: 1 via TOPICAL
  Filled 2014-06-09: qty 56

## 2014-06-09 MED ORDER — LANOLIN HYDROUS EX OINT: TOPICAL_OINTMENT | CUTANEOUS | Status: DC | PRN

## 2014-06-09 MED ORDER — ONDANSETRON HCL 4 MG PO TABS: 4.0000 mg | ORAL_TABLET | ORAL | Status: DC | PRN

## 2014-06-09 MED ORDER — WITCH HAZEL-GLYCERIN EX PADS: 1.0000 "application " | MEDICATED_PAD | CUTANEOUS | Status: DC | PRN

## 2014-06-09 MED ORDER — DIBUCAINE 1 % RE OINT: 1.0000 "application " | TOPICAL_OINTMENT | RECTAL | Status: DC | PRN

## 2014-06-09 MED ORDER — DIPHENHYDRAMINE HCL 25 MG PO CAPS: 25.0000 mg | ORAL_CAPSULE | Freq: Four times a day (QID) | ORAL | Status: DC | PRN

## 2014-06-09 MED ORDER — OXYCODONE-ACETAMINOPHEN 5-325 MG PO TABS
1.0000 | ORAL_TABLET | ORAL | Status: DC | PRN
  Administered 2014-06-09 (×2): 1 via ORAL
  Filled 2014-06-09 (×2): qty 1

## 2014-06-09 MED ORDER — SIMETHICONE 80 MG PO CHEW: 80.0000 mg | CHEWABLE_TABLET | ORAL | Status: DC | PRN

## 2014-06-09 MED ORDER — IBUPROFEN 600 MG PO TABS
600.0000 mg | ORAL_TABLET | Freq: Four times a day (QID) | ORAL | Status: DC
  Administered 2014-06-09 – 2014-06-10 (×6): 600 mg via ORAL
  Filled 2014-06-09 (×6): qty 1

## 2014-06-09 MED ORDER — TETANUS-DIPHTH-ACELL PERTUSSIS 5-2.5-18.5 LF-MCG/0.5 IM SUSP: 0.5000 mL | Freq: Once | INTRAMUSCULAR | Status: DC

## 2014-06-09 MED ORDER — SENNOSIDES-DOCUSATE SODIUM 8.6-50 MG PO TABS
2.0000 | ORAL_TABLET | ORAL | Status: DC
  Administered 2014-06-09: 2 via ORAL
  Filled 2014-06-09: qty 2

## 2014-06-09 MED ORDER — OXYCODONE-ACETAMINOPHEN 5-325 MG PO TABS
2.0000 | ORAL_TABLET | ORAL | Status: DC | PRN
  Administered 2014-06-10: 2 via ORAL
  Filled 2014-06-09 (×2): qty 2

## 2014-06-09 NOTE — Anesthesia Postprocedure Evaluation (Signed)
Anesthesia Post Note  Patient: Cathy Webster  Procedure(s) Performed: * No procedures listed *  Anesthesia type: Epidural  Patient location: Mother/Baby  Post pain: Pain level controlled  Post assessment: Post-op Vital signs reviewed  Last Vitals:  Filed Vitals:   06/09/14 0440  BP: 136/62  Pulse: 94  Temp: 36.8 C  Resp: 20    Post vital signs: Reviewed  Level of consciousness:alert  Complications: No apparent anesthesia complications

## 2014-06-10 ENCOUNTER — Other Ambulatory Visit: Payer: Self-pay

## 2014-06-10 ENCOUNTER — Encounter: Payer: Self-pay | Admitting: Family Medicine

## 2014-06-10 MED ORDER — NORETHINDRONE 0.35 MG PO TABS: 1.0000 | ORAL_TABLET | Freq: Every day | ORAL | Status: DC

## 2014-06-10 MED ORDER — IBUPROFEN 600 MG PO TABS: 600.0000 mg | ORAL_TABLET | Freq: Four times a day (QID) | ORAL | Status: DC

## 2014-06-10 NOTE — Progress Notes (Signed)
Post Partum Day #1 Subjective: no complaints, up ad lib and tolerating PO; bottlefeeding; desires OCPs for contraception  Objective: Blood pressure 102/54, pulse 92, temperature 98.2 F (36.8 C), temperature source Oral, resp. rate 18, height  (1.676 m), weight 127.914 kg (282 lb), last menstrual period 09/09/2013, SpO2 99.00%, unknown if currently breastfeeding.  Physical Exam:  General: alert, cooperative and no distress Lungs: nl effort Heart: RRR Lochia: appropriate Uterine Fundus: firm DVT Evaluation: No evidence of DVT seen on physical exam.   Recent Labs  06/08/14 1924  HGB 12.2  HCT 35.9*    Assessment/Plan: Plan for discharge tomorrow due to +GBS status   LOS: 2 days   Cam Hai CNM 06/10/2014, 8:43 AM

## 2014-06-10 NOTE — Discharge Summary (Signed)
Obstetric Discharge Summary Reason for Admission: onset of labor Prenatal Procedures: ultrasound Intrapartum Procedures: repeat VBAC Postpartum Procedures: none Complications-Operative and Postpartum: 2nd degree perineal laceration Hemoglobin  Date Value Ref Range Status  06/08/2014 12.2  12.0 - 15.0 g/dL Final     HCT  Date Value Ref Range Status  06/08/2014 35.9* 36.0 - 46.0 % Final    Physical Exam:  General: alert, cooperative and no distress Lochia: appropriate Uterine Fundus: firm Incision: healing well DVT Evaluation: No evidence of DVT seen on physical exam.  Discharge Diagnoses: Term Pregnancy-delivered  Discharge Information: Date: 06/10/2014 Activity: pelvic rest Diet: routine Medications: Ibuprofen Condition: stable Instructions: refer to practice specific booklet Discharge to: home   Newborn Data: Live born female  Birth Weight: 7 lb 7.6 oz (3390 g) APGAR: 8, 9  Home with mother.  Cathy Webster 06/10/2014, 2:56 PM

## 2014-06-10 NOTE — Discharge Instructions (Signed)

## 2014-07-08 ENCOUNTER — Ambulatory Visit (INDEPENDENT_AMBULATORY_CARE_PROVIDER_SITE_OTHER): Payer: BC Managed Care – PPO | Admitting: Family Medicine

## 2014-07-08 DIAGNOSIS — L309 Dermatitis, unspecified: Secondary | ICD-10-CM

## 2014-07-08 MED ORDER — CLOTRIMAZOLE-BETAMETHASONE 1-0.05 % EX CREA: 1.0000 "application " | TOPICAL_CREAM | Freq: Two times a day (BID) | CUTANEOUS | Status: DC

## 2014-07-08 NOTE — Progress Notes (Signed)
  Subjective:     Cathy Webster is a 23 y.o. female who presents for a postpartum visit. She is 4 weeks postpartum following a spontaneous vaginal delivery. I have fully reviewed the prenatal and intrapartum course. The delivery was at 39.5 gestational weeks. Outcome: spontaneous vaginal delivery. Anesthesia: epidural. Postpartum course has been normal. Baby's course has been normal. Baby is feeding by bottle. Bleeding no bleeding. Bowel function is normal. Bladder function is normal. Patient is sexually active. Contraception method is oral progesterone-only contraceptive. Postpartum depression screening: negative.  Has pruritic raised rash on breasts bilaterally.  Improved somewhat with steroid cream.  Not improved with nystatin.  The following portions of the patient's history were reviewed and updated as appropriate: allergies, current medications, past family history, past medical history, past social history, past surgical history and problem list.  Review of Systems Pertinent items are noted in HPI.   Objective:    BP 115/71  Pulse 79  Temp(Src) 98.1 F (36.7 C)  Wt 271 lb (122.925 kg)  Breastfeeding? No  General:  alert, cooperative and no distress   Breasts:  fine raised slightly erythemic rash on lateral breasts and cleavage.   Vulva:  normal  Vagina: normal vagina  Rectal Exam: Not performed.        Assessment:     4 week postpartum exam. Pap smear not done at today's visit. Rash  Plan:    1. Contraception: oral progesterone-only contraceptive 2. Lotriderm BID 3. Follow up in: 1 year or as needed.

## 2014-08-06 ENCOUNTER — Encounter: Payer: Self-pay | Admitting: General Practice

## 2014-08-09 ENCOUNTER — Encounter (HOSPITAL_COMMUNITY): Payer: Self-pay | Admitting: *Deleted

## 2014-08-21 IMAGING — US US OB FOLLOW-UP
1 series · 12 of 28 positions shown · non-contrast
Comparison: none

[Series 1: us ob follow up · 58 acquisitions, 12 frames shown]
[im 3/58]
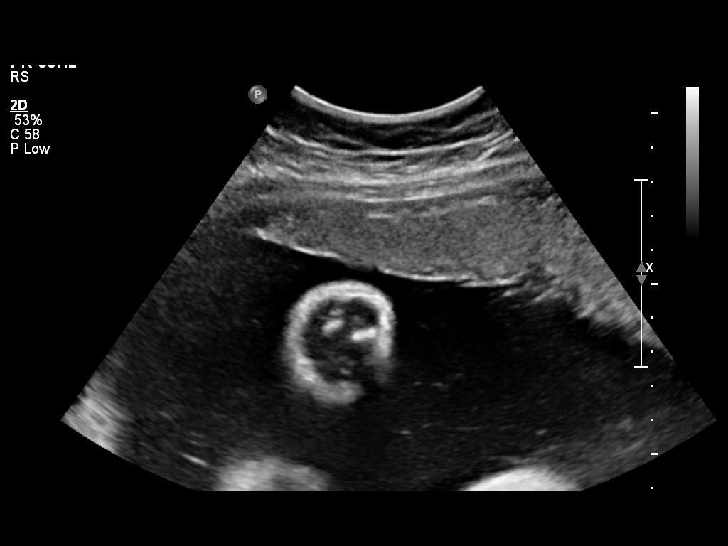
[im 7/58]
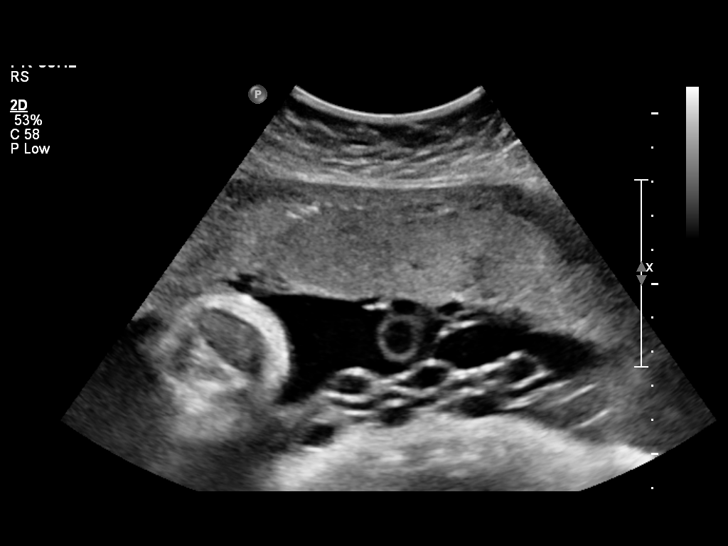
[im 11/58]
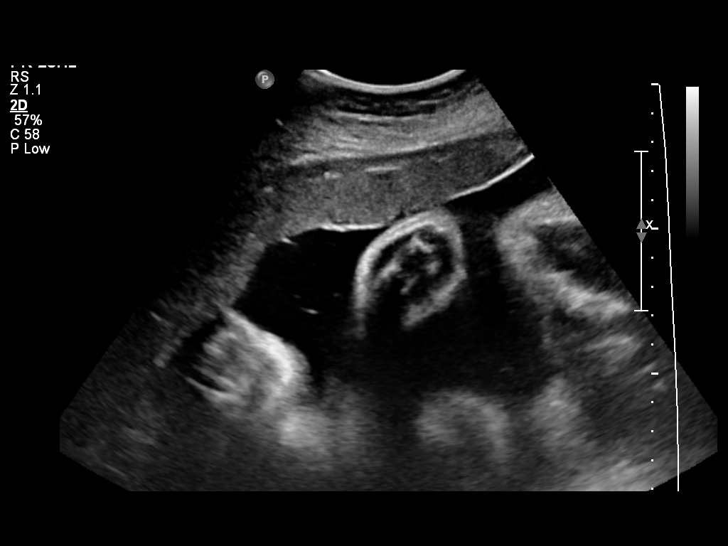
[im 17/58]
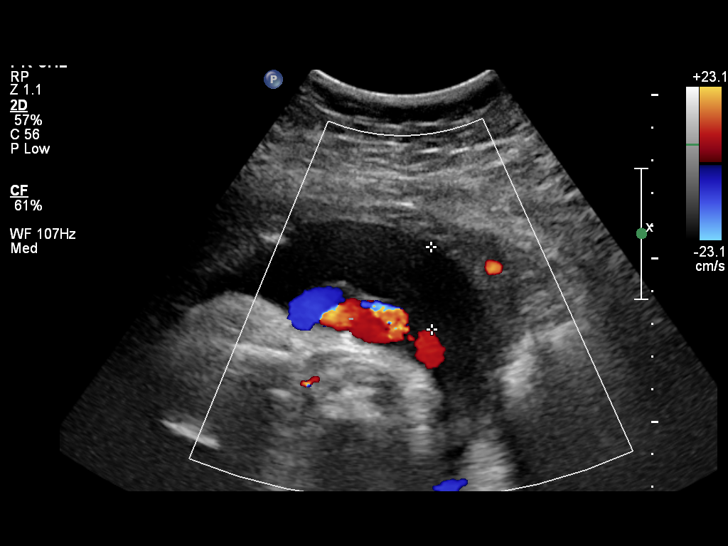
[im 22/58]
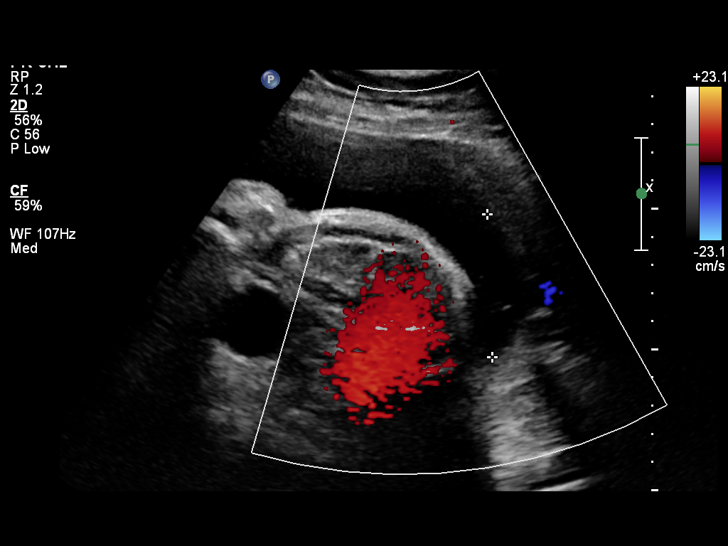
[im 26/58]
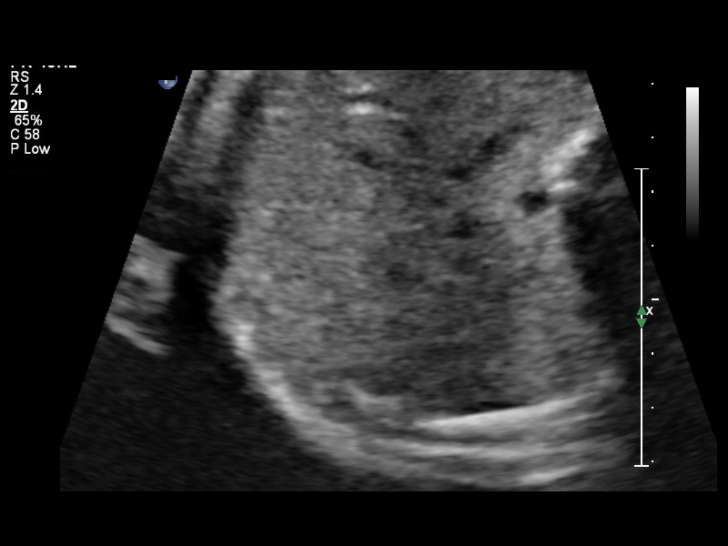
[im 32/58]
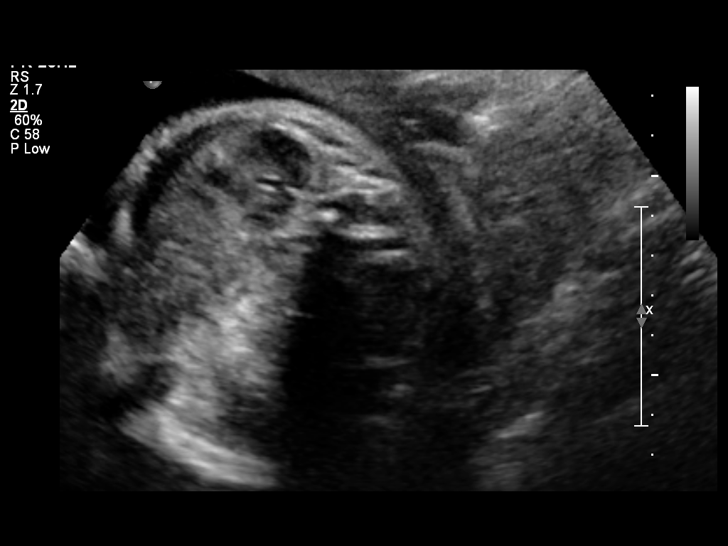
[im 36/58]
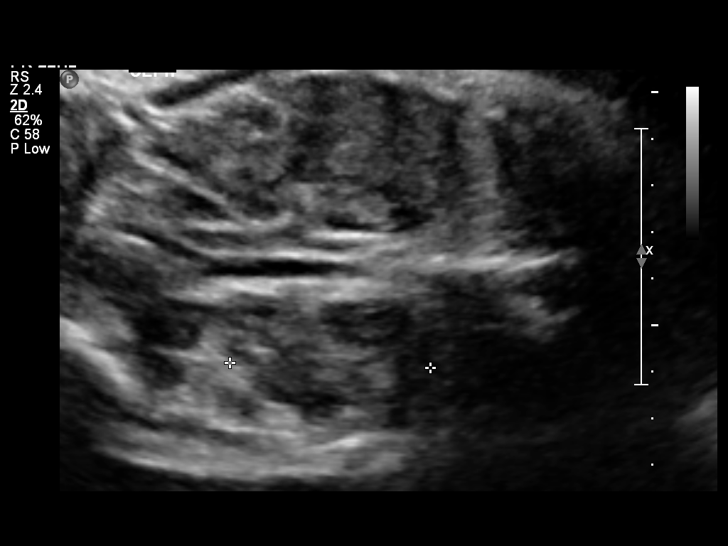
[im 41/58]
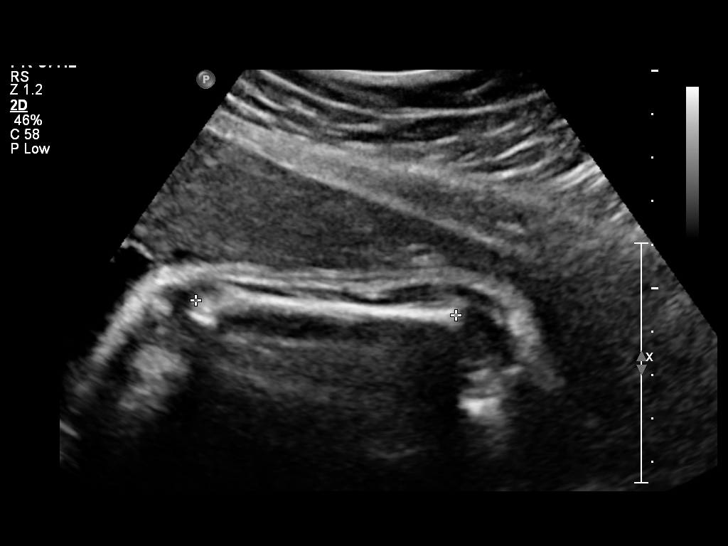
[im 47/58]
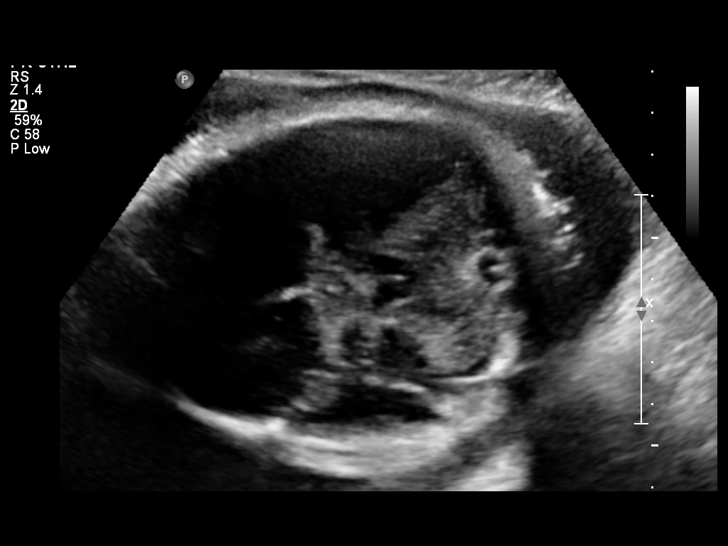
[im 51/58]
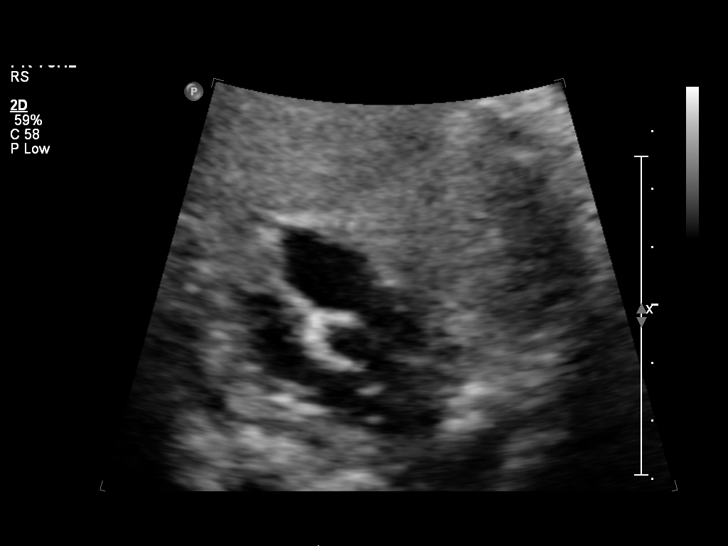
[im 55/58]
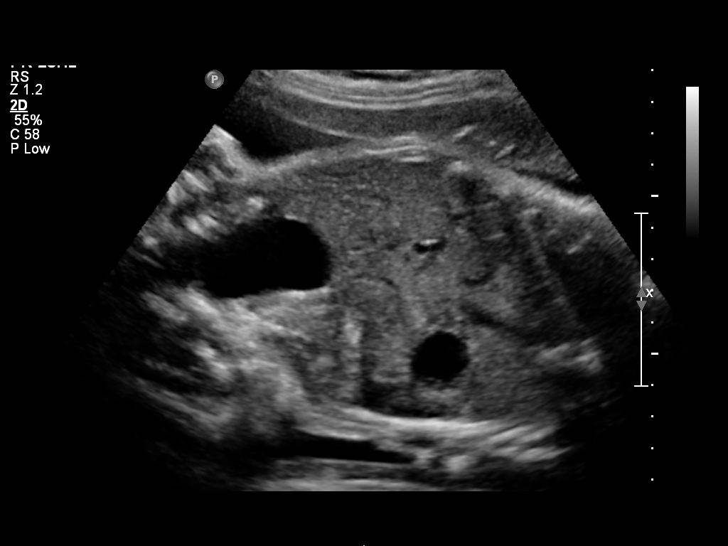

[12 of 28 positions shown; findings below may reference images not displayed]

OBSTETRICS REPORT
                    (Corrected Final 05/20/2014 [DATE])

Service(s) Provided

 US OB FOLLOW UP                                       76816.1
Indications

 Size greater than dates (Large for gestational [AGE]
 Previous cesarean section
Fetal Evaluation

 Num Of Fetuses:    1
 Fetal Heart Rate:  161                          bpm
 Cardiac Activity:  Observed
 Presentation:      Cephalic
 Placenta:          Anterior, above cervical os
 P. Cord            Visualized, central
 Insertion:

 Amniotic Fluid
 AFI FV:      Polyhydramnios
 AFI Sum:     27.28   cm       97  %Tile     Larg Pckt:    9.95  cm
 RUQ:   9.95    cm   RLQ:    9.29   cm    LUQ:   5.06    cm   LLQ:    2.98   cm
Biometry

 BPD:     85.7  mm     G. Age:  34w 4d                CI:        75.91   70 - 86
                                                      FL/HC:      22.0   20.1 -

 HC:     311.8  mm     G. Age:  34w 6d       15  %    HC/AC:      1.01   0.93 -

 AC:     308.1  mm     G. Age:  34w 5d       46  %    FL/BPD:     80.0   71 - 87
 FL:      68.6  mm     G. Age:  35w 1d       45  %    FL/AC:      22.3   20 - 24
 HUM:     59.8  mm     G. Age:  34w 5d       56  %

 Est. FW:    9781  gm      5 lb 9 oz     56  %
Gestational Age

 LMP:           34w 2d        Date:  09/09/13                 EDD:   06/16/14
 U/S Today:     34w 6d                                        EDD:   06/12/14
Anatomy
 Cranium:          Appears normal         Aortic Arch:      Previously seen
 Fetal Cavum:      Appears normal         Ductal Arch:      Previously seen
 Ventricles:       Appears normal         Diaphragm:        Appears normal
 Choroid Plexus:   Previously seen        Stomach:          Appears normal, left
                                                            sided
 Cerebellum:       Previously seen        Abdomen:          Appears normal
 Posterior Fossa:  Appears normal         Abdominal Wall:   Previously seen
 Nuchal Fold:      Not applicable (>20    Cord Vessels:     Previously seen
                   wks GA)
 Face:             Orbits and profile     Kidneys:          Appear normal
                   previously seen
 Lips:             Previously seen        Bladder:          Appears normal
 Heart:            Appears normal         Spine:            Previously seen
                   (4CH, axis, and
                   situs)
 RVOT:             Appears normal         Lower             Previously seen
                                          Extremities:
 LVOT:             Previously seen        Upper             Previously seen
                                          Extremities:
Cervix Uterus Adnexa

 Cervix:       Not visualized (advanced GA >44wks)
 Uterus:       No abnormality visualized.

 Left Ovary:    Not visualized.
 Right Ovary:   Not visualized.
Impression

 SIUP at 55w1d (remote read)
 EFW 56th%
 No dysmorphic features
 Polyhydramnios
 No previa
Recommendations

 In setting of polyhydramnios, I recommend twice weekly NST
 and weekly AFI.
 Repeat interval growth in 4 weeks.
 If undelivered and testing remains reassuring in the interim,
 consider timed delivery between 39-40 weeks.

                 Attending Physician, ZU

## 2014-09-01 ENCOUNTER — Encounter: Payer: Self-pay | Admitting: Obstetrics & Gynecology

## 2015-01-30 ENCOUNTER — Emergency Department
Admit: 2015-01-30 | Disposition: A | Discharge status: Home / Self Care | Payer: Self-pay | Admitting: Physician Assistant

## 2015-06-20 ENCOUNTER — Other Ambulatory Visit: Payer: Self-pay | Admitting: Advanced Practice Midwife

## 2015-06-20 NOTE — Telephone Encounter (Signed)
No more refills. Needs to make appointment at office.

## 2015-06-21 ENCOUNTER — Telehealth: Payer: Self-pay | Admitting: General Practice

## 2015-06-21 NOTE — Telephone Encounter (Signed)
Patient called and left message stating she needs a refill on her birth control pills. Per chart review, refill was submitted yesterday. Called patient and informed her of one refill sent to pharmacy and discussed that she will need to come in for yearly visit for year supply of refills. Patient verbalized understanding. Told patient I will let the front office know and they will contact her to set up appt. Patient verbalized understanding and had no questions

## 2015-08-10 ENCOUNTER — Encounter: Payer: Self-pay | Admitting: Family

## 2015-08-10 ENCOUNTER — Other Ambulatory Visit (HOSPITAL_COMMUNITY)
Admission: RE | Admit: 2015-08-10 | Discharge: 2015-08-10 | Disposition: A | Discharge status: Home / Self Care | Payer: BC Managed Care – PPO | Source: Ambulatory Visit | Attending: Family | Admitting: Family

## 2015-08-10 ENCOUNTER — Ambulatory Visit (INDEPENDENT_AMBULATORY_CARE_PROVIDER_SITE_OTHER): Payer: Medicaid Other | Admitting: Family

## 2015-08-10 VITALS — BP 128/58 | HR 97 | Temp 98.8°F | Wt 275.3 lb

## 2015-08-10 DIAGNOSIS — Z113 Encounter for screening for infections with a predominantly sexual mode of transmission: Secondary | ICD-10-CM | POA: Insufficient documentation

## 2015-08-10 DIAGNOSIS — Z98891 History of uterine scar from previous surgery: Secondary | ICD-10-CM | POA: Diagnosis not present

## 2015-08-10 DIAGNOSIS — Z01419 Encounter for gynecological examination (general) (routine) without abnormal findings: Secondary | ICD-10-CM | POA: Insufficient documentation

## 2015-08-10 DIAGNOSIS — O0991 Supervision of high risk pregnancy, unspecified, first trimester: Secondary | ICD-10-CM | POA: Diagnosis present

## 2015-08-10 DIAGNOSIS — O0990 Supervision of high risk pregnancy, unspecified, unspecified trimester: Secondary | ICD-10-CM | POA: Insufficient documentation

## 2015-08-10 LAB — POCT URINALYSIS DIP (DEVICE)
Bilirubin Urine: NEGATIVE
GLUCOSE, UA: NEGATIVE mg/dL
Hgb urine dipstick: NEGATIVE
KETONES UR: NEGATIVE mg/dL
LEUKOCYTES UA: NEGATIVE
Nitrite: NEGATIVE
PROTEIN: NEGATIVE mg/dL
Specific Gravity, Urine: 1.025 (ref 1.005–1.030)
Urobilinogen, UA: 0.2 mg/dL (ref 0.0–1.0)
pH: 7 (ref 5.0–8.0)

## 2015-08-10 LAB — POCT PREGNANCY, URINE: Preg Test, Ur: POSITIVE — AB

## 2015-08-10 MED ORDER — TANDEM PLUS 162-115.2-1 MG PO CAPS: 1.0000 | ORAL_CAPSULE | Freq: Every day | ORAL | Status: DC

## 2015-08-10 NOTE — Patient Instructions (Signed)
First Trimester of Pregnancy The first trimester of pregnancy is from week 1 until the end of week 12 (months 1 through 3). A week after a sperm fertilizes an egg, the egg will implant on the wall of the uterus. This embryo will begin to develop into a baby. Genes from you and your partner are forming the baby. The female genes determine whether the baby is a boy or a girl. At 6-8 weeks, the eyes and face are formed, and the heartbeat can be seen on ultrasound. At the end of 12 weeks, all the baby's organs are formed.  Now that you are pregnant, you will want to do everything you can to have a healthy baby. Two of the most important things are to get good prenatal care and to follow your health care provider's instructions. Prenatal care is all the medical care you receive before the baby's birth. This care will help prevent, find, and treat any problems during the pregnancy and childbirth. BODY CHANGES Your body goes through many changes during pregnancy. The changes vary from woman to woman.   You may gain or lose a couple of pounds at first.  You may feel sick to your stomach (nauseous) and throw up (vomit). If the vomiting is uncontrollable, call your health care provider.  You may tire easily.  You may develop headaches that can be relieved by medicines approved by your health care provider.  You may urinate more often. Painful urination may mean you have a bladder infection.  You may develop heartburn as a result of your pregnancy.  You may develop constipation because certain hormones are causing the muscles that push waste through your intestines to slow down.  You may develop hemorrhoids or swollen, bulging veins (varicose veins).  Your breasts may begin to grow larger and become tender. Your nipples may stick out more, and the tissue that surrounds them (areola) may become darker.  Your gums may bleed and may be sensitive to brushing and flossing.  Dark spots or blotches  (chloasma, mask of pregnancy) may develop on your face. This will likely fade after the baby is born.  Your menstrual periods will stop.  You may have a loss of appetite.  You may develop cravings for certain kinds of food.  You may have changes in your emotions from day to day, such as being excited to be pregnant or being concerned that something may go wrong with the pregnancy and baby.  You may have more vivid and strange dreams.  You may have changes in your hair. These can include thickening of your hair, rapid growth, and changes in texture. Some women also have hair loss during or after pregnancy, or hair that feels dry or thin. Your hair will most likely return to normal after your baby is born. WHAT TO EXPECT AT YOUR PRENATAL VISITS During a routine prenatal visit:  You will be weighed to make sure you and the baby are growing normally.  Your blood pressure will be taken.  Your abdomen will be measured to track your baby's growth.  The fetal heartbeat will be listened to starting around week 10 or 12 of your pregnancy.  Test results from any previous visits will be discussed. Your health care provider may ask you:  How you are feeling.  If you are feeling the baby move.  If you have had any abnormal symptoms, such as leaking fluid, bleeding, severe headaches, or abdominal cramping.  If you are using any tobacco products,   including cigarettes, chewing tobacco, and electronic cigarettes.  If you have any questions. Other tests that may be performed during your first trimester include:  Blood tests to find your blood type and to check for the presence of any previous infections. They will also be used to check for low iron levels (anemia) and Rh antibodies. Later in the pregnancy, blood tests for diabetes will be done along with other tests if problems develop.  Urine tests to check for infections, diabetes, or protein in the urine.  An ultrasound to confirm the  proper growth and development of the baby.  An amniocentesis to check for possible genetic problems.  Fetal screens for spina bifida and Down syndrome.  You may need other tests to make sure you and the baby are doing well.  HIV (human immunodeficiency virus) testing. Routine prenatal testing includes screening for HIV, unless you choose not to have this test. HOME CARE INSTRUCTIONS  Medicines  Follow your health care provider's instructions regarding medicine use. Specific medicines may be either safe or unsafe to take during pregnancy.  Take your prenatal vitamins as directed.  If you develop constipation, try taking a stool softener if your health care provider approves. Diet  Eat regular, well-balanced meals. Choose a variety of foods, such as meat or vegetable-based protein, fish, milk and low-fat dairy products, vegetables, fruits, and whole grain breads and cereals. Your health care provider will help you determine the amount of weight gain that is right for you.  Avoid raw meat and uncooked cheese. These carry germs that can cause birth defects in the baby.  Eating four or five small meals rather than three large meals a day may help relieve nausea and vomiting. If you start to feel nauseous, eating a few soda crackers can be helpful. Drinking liquids between meals instead of during meals also seems to help nausea and vomiting.  If you develop constipation, eat more high-fiber foods, such as fresh vegetables or fruit and whole grains. Drink enough fluids to keep your urine clear or pale yellow. Activity and Exercise  Exercise only as directed by your health care provider. Exercising will help you:  Control your weight.  Stay in shape.  Be prepared for labor and delivery.  Experiencing pain or cramping in the lower abdomen or low back is a good sign that you should stop exercising. Check with your health care provider before continuing normal exercises.  Try to avoid  standing for long periods of time. Move your legs often if you must stand in one place for a long time.  Avoid heavy lifting.  Wear low-heeled shoes, and practice good posture.  You may continue to have sex unless your health care provider directs you otherwise. Relief of Pain or Discomfort  Wear a good support bra for breast tenderness.   Take warm sitz baths to soothe any pain or discomfort caused by hemorrhoids. Use hemorrhoid cream if your health care provider approves.   Rest with your legs elevated if you have leg cramps or low back pain.  If you develop varicose veins in your legs, wear support hose. Elevate your feet for 15 minutes, 3-4 times a day. Limit salt in your diet. Prenatal Care  Schedule your prenatal visits by the twelfth week of pregnancy. They are usually scheduled monthly at first, then more often in the last 2 months before delivery.  Write down your questions. Take them to your prenatal visits.  Keep all your prenatal visits as directed by your   health care provider. Safety  Wear your seat belt at all times when driving.  Make a list of emergency phone numbers, including numbers for family, friends, the hospital, and police and fire departments. General Tips  Ask your health care provider for a referral to a local prenatal education class. Begin classes no later than at the beginning of month 6 of your pregnancy.  Ask for help if you have counseling or nutritional needs during pregnancy. Your health care provider can offer advice or refer you to specialists for help with various needs.  Do not use hot tubs, steam rooms, or saunas.  Do not douche or use tampons or scented sanitary pads.  Do not cross your legs for long periods of time.  Avoid cat litter boxes and soil used by cats. These carry germs that can cause birth defects in the baby and possibly loss of the fetus by miscarriage or stillbirth.  Avoid all smoking, herbs, alcohol, and medicines  not prescribed by your health care provider. Chemicals in these affect the formation and growth of the baby.  Do not use any tobacco products, including cigarettes, chewing tobacco, and electronic cigarettes. If you need help quitting, ask your health care provider. You may receive counseling support and other resources to help you quit.  Schedule a dentist appointment. At home, brush your teeth with a soft toothbrush and be gentle when you floss. SEEK MEDICAL CARE IF:   You have dizziness.  You have mild pelvic cramps, pelvic pressure, or nagging pain in the abdominal area.  You have persistent nausea, vomiting, or diarrhea.  You have a bad smelling vaginal discharge.  You have pain with urination.  You notice increased swelling in your face, hands, legs, or ankles. SEEK IMMEDIATE MEDICAL CARE IF:   You have a fever.  You are leaking fluid from your vagina.  You have spotting or bleeding from your vagina.  You have severe abdominal cramping or pain.  You have rapid weight gain or loss.  You vomit blood or material that looks like coffee grounds.  You are exposed to [place] measles and have never had them.  You are exposed to fifth disease or chickenpox.  You develop a severe headache.  You have shortness of breath.  You have any kind of trauma, such as from a fall or a car accident.   This information is not intended to replace advice given to you by your health care provider. Make sure you discuss any questions you have with your health care provider.   Document Released: 09/18/2001 Document Revised: 10/15/2014 Document Reviewed: 08/04/2013 Elsevier Interactive Patient Education 2016 [employer].  Vaginal Birth After Cesarean Delivery Vaginal birth after cesarean delivery (VBAC) is giving birth vaginally after previously delivering a baby by a cesarean. In the past, if a woman had a cesarean delivery, all births afterward would be done by cesarean delivery. This is  no longer true. It can be safe for the mother to try a vaginal delivery after having a cesarean delivery.  It is important to discuss VBAC with your health care provider early in the pregnancy so you can understand the risks, benefits, and options. It will give you time to decide what is best in your particular case. The final decision about whether to have a VBAC or repeat cesarean delivery should be between you and your health care provider. Any changes in your health or your baby's health during your pregnancy may make it necessary to change your initial decision  about VBAC.  WOMEN WHO PLAN TO HAVE A VBAC SHOULD CHECK WITH THEIR HEALTH CARE PROVIDER TO BE SURE THAT:  The previous cesarean delivery was done with a low transverse uterine cut (incision) (not a vertical classical incision).   The birth canal is big enough for the baby.   There were no other operations on the uterus.   An electronic fetal monitor (EFM) will be on at all times during labor.   An operating room will be available and ready in case an emergency cesarean delivery is needed.   A health care provider and surgical nursing staff will be available at all times during labor to be ready to do an emergency delivery cesarean if necessary.   An anesthesiologist will be present in case an emergency cesarean delivery is needed.   The nursery is prepared and has adequate personnel and necessary equipment available to care for the baby in case of an emergency cesarean delivery. BENEFITS OF VBAC  Shorter stay in the hospital.   Avoidance of risks associated with cesarean delivery, such as:  Surgical complications, such as opening of the incision or hernia in the incision.  Injury to other organs.  Fever. This can occur if an infection develops after surgery. It can also occur as a reaction to the medicine given to make you numb during the surgery.  Less blood loss and need for blood transfusions.  Lower risk of  blood clots and infection.  Shorter recovery.   Decreased risk for having to remove the uterus (hysterectomy).   Decreased risk for the placenta to completely or partially cover the opening of the uterus (placenta previa) with a future pregnancy.   Decrease risk in future labor and delivery. RISKS OF A VBAC  Tearing (rupture) of the uterus. This is occurs in less than 1% of VBACs. The risk of this happening is higher if:  Steps are taken to begin the labor process (induce labor) or stimulate or strengthen contractions (augment labor).   Medicine is used to soften (ripen) the cervix.  Having to remove the uterus (hysterectomy) if it ruptures. VBAC SHOULD NOT BE DONE IF:  The previous cesarean delivery was done with a vertical (classical) or T-shaped incision or you do not know what kind of incision was made.   You had a ruptured uterus.   You have had certain types of surgery on your uterus, such as removal of uterine fibroids. Ask your health care provider about other types of surgeries that prevent you from having a VBAC.  You have certain medical or childbirth (obstetrical) problems.   There are problems with the baby.   You have had two previous cesarean deliveries and no vaginal deliveries. OTHER FACTS TO KNOW ABOUT VBAC:  It is safe to have an epidural anesthetic with VBAC.   It is safe to turn the baby from a breech position (attempt an external cephalic version).   It is safe to try a VBAC with twins.   VBAC may not be successful if your baby weights 8.8 lb (4 kg) or more. However, weight predictions are not always accurate and should not be used alone to decide if VBAC is right for you.  There is an increased failure rate if the time between the cesarean delivery and VBAC is less than 19 months.   Your health care provider may advise against a VBAC if you have preeclampsia (high blood pressure, protein in the urine, and swelling of face and  extremities).     VBAC is often successful if you previously gave birth vaginally.   VBAC is often successful when the labor starts spontaneously before the due date.   Delivering a baby through a VBAC is similar to having a normal spontaneous vaginal delivery.   This information is not intended to replace advice given to you by your health care provider. Make sure you discuss any questions you have with your health care provider.   Document Released: 03/17/2007 Document Revised: 10/15/2014 Document Reviewed: 04/23/2013 Elsevier Interactive Patient Education [employer]2016 Elsevier Inc.

## 2015-08-10 NOTE — Progress Notes (Signed)
Initial prenatal education packet given Breastfeeding tip of the week reviewed 1 hr gtt and initial prenatal labs today Declines flu

## 2015-08-10 NOTE — Progress Notes (Signed)
   Subjective:    Cathy Webster is a [id number] 375w6d being seen today for her first obstetrical visit.  Her obstetrical history is significant for close pregnancy interval and history of csecion with 2 successful VBACs.  Patient does intend to breast feed. Pregnancy history fully reviewed.  Patient reports fatigue.  Filed Vitals:   08/10/15 1300  BP: 128/58  Pulse: 97  Temp: 98.8 F (37.1 C)  Weight: 275 lb 4.8 oz (124.875 kg)    HISTORY: OB History  Gravida Para Term Preterm AB SAB TAB Ectopic Multiple Living  4 3 3       3     # Outcome Date GA Lbr Len/2nd Weight Sex Delivery Anes PTL Lv  4 Current           3 Term 06/09/14 6575w6d 09:38 / 00:13 7 lb 7.6 oz (3.39 kg) M VBAC EPI  Y  2 Term 03/24/13 255w1d 06:25 / 00:13 6 lb 10.9 oz (3.03 kg) F VBAC EPI  Y  1 Term 08/07/08 3540w0d  6 lb 14 oz (3.118 kg) F CS-Unspec EPI  Y     Past Medical History  Diagnosis Date  . Abnormal Pap smear 2008   Past Surgical History  Procedure Laterality Date  . Cesarean section    . Tonsillectomy     Family History  Problem Relation Age of Onset  . Diabetes Mother   . Hypertension Mother   . Asthma Brother      Exam     BP 128/58 mmHg  Pulse 97  Temp(Src) 98.8 F (37.1 C)  Wt 275 lb 4.8 oz (124.875 kg)  LMP 05/12/2015 (Exact Date) Uterine Size: size equals dates  Pelvic Exam:    Perineum: No Hemorrhoids, Normal Perineum   Vulva: normal   Vagina:  normal mucosa, normal discharge, no palpable nodules   pH: Not done   Cervix: no bleeding following Pap, no cervical motion tenderness and no lesions   Adnexa: normal adnexa and no mass, fullness, tenderness   Bony Pelvis: Adequate  System: Breast:  No nipple retraction or dimpling, No nipple discharge or bleeding, No axillary or supraclavicular adenopathy, Normal to palpation without dominant masses   Skin: normal coloration and turgor, no rashes    Neurologic: negative   Extremities: normal strength, tone, and muscle mass   HEENT neck supple with midline trachea and thyroid without masses   Mouth/Teeth mucous membranes moist, pharynx normal without lesions   Neck supple and no masses   Cardiovascular: regular rate and rhythm, no murmurs or gallops   Respiratory:  appears well, vitals normal, no respiratory distress, acyanotic, normal RR, neck free of mass or lymphadenopathy, chest clear, no wheezing, crepitations, rhonchi, normal symmetric air entry   Abdomen: soft, non-tender; bowel sounds normal; no masses,  no organomegaly   Urinary: urethral meatus normal      Assessment:    [name] Patient Active Problem List   Diagnosis Date Noted  . Supervision of high risk pregnancy, antepartum 08/10/2015  . History of cesarean section, low transverse 03/16/2014  . Abnormal Pap smear of cervix 12/22/2013  . Previous cesarean delivery, delivered, with or without mention of antepartum condition 11/24/2013  . Short interval between pregnancies complicating pregnancy, antepartum 11/24/2013        Plan:     Initial labs drawn. Prenatal vitamins. Problem list reviewed and updated. Genetic Screening discussed First Screen: ordered.  Follow up in 4 weeks.   Cathy Webster 08/10/2015

## 2015-08-10 NOTE — Addendum Note (Signed)
Addended by: Gerome ApleyZEYFANG, Arnette Driggs L on: 08/10/2015 03:05 PM   Modules accepted: Orders

## 2015-08-11 ENCOUNTER — Other Ambulatory Visit: Payer: Self-pay | Admitting: Family

## 2015-08-11 ENCOUNTER — Encounter (HOSPITAL_COMMUNITY): Payer: Self-pay

## 2015-08-11 ENCOUNTER — Ambulatory Visit (HOSPITAL_COMMUNITY)
Admission: RE | Admit: 2015-08-11 | Discharge: 2015-08-11 | Disposition: A | Discharge status: Home / Self Care | Payer: Medicaid Other | Source: Ambulatory Visit | Attending: Obstetrics & Gynecology | Admitting: Obstetrics & Gynecology

## 2015-08-11 ENCOUNTER — Encounter: Payer: Self-pay | Admitting: Family

## 2015-08-11 ENCOUNTER — Ambulatory Visit (HOSPITAL_COMMUNITY)
Admission: RE | Admit: 2015-08-11 | Discharge: 2015-08-11 | Disposition: A | Discharge status: Home / Self Care | Payer: Medicaid Other | Source: Ambulatory Visit | Attending: Family | Admitting: Family

## 2015-08-11 DIAGNOSIS — O99211 Obesity complicating pregnancy, first trimester: Secondary | ICD-10-CM | POA: Diagnosis not present

## 2015-08-11 DIAGNOSIS — Z369 Encounter for antenatal screening, unspecified: Secondary | ICD-10-CM

## 2015-08-11 DIAGNOSIS — Z36 Encounter for antenatal screening of mother: Secondary | ICD-10-CM | POA: Diagnosis present

## 2015-08-11 DIAGNOSIS — O34219 Maternal care for unspecified type scar from previous cesarean delivery: Secondary | ICD-10-CM

## 2015-08-11 DIAGNOSIS — O0991 Supervision of high risk pregnancy, unspecified, first trimester: Secondary | ICD-10-CM

## 2015-08-11 DIAGNOSIS — Z3A13 13 weeks gestation of pregnancy: Secondary | ICD-10-CM | POA: Diagnosis not present

## 2015-08-11 LAB — PRENATAL PROFILE (SOLSTAS)
Antibody Screen: NEGATIVE
Basophils Absolute: 0 10*3/uL (ref 0.0–0.1)
Basophils Relative: 0 % (ref 0–1)
Eosinophils Absolute: 0.1 10*3/uL (ref 0.0–0.7)
Eosinophils Relative: 2 % (ref 0–5)
HEMATOCRIT: 34.7 % — AB (ref 36.0–46.0)
HEP B S AG: NEGATIVE
HIV: NONREACTIVE
Hemoglobin: 11.5 g/dL — ABNORMAL LOW (ref 12.0–15.0)
LYMPHS ABS: 2.1 10*3/uL (ref 0.7–4.0)
Lymphocytes Relative: 36 % (ref 12–46)
MCH: 27.8 pg (ref 26.0–34.0)
MCHC: 33.1 g/dL (ref 30.0–36.0)
MCV: 84 fL (ref 78.0–100.0)
MPV: 10.2 fL (ref 8.6–12.4)
Monocytes Absolute: 0.4 10*3/uL (ref 0.1–1.0)
Monocytes Relative: 7 % (ref 3–12)
NEUTROS ABS: 3.2 10*3/uL (ref 1.7–7.7)
Neutrophils Relative %: 55 % (ref 43–77)
Platelets: 199 10*3/uL (ref 150–400)
RBC: 4.13 MIL/uL (ref 3.87–5.11)
RDW: 13.8 % (ref 11.5–15.5)
Rh Type: POSITIVE
Rubella: 6.08 Index — ABNORMAL HIGH (ref <0.90)
WBC: 5.8 10*3/uL (ref 4.0–10.5)

## 2015-08-11 LAB — PRESCRIPTION MONITORING PROFILE (19 PANEL)
Amphetamine/Meth: NEGATIVE ng/mL
BUPRENORPHINE, URINE: NEGATIVE ng/mL
Barbiturate Screen, Urine: NEGATIVE ng/mL
Benzodiazepine Screen, Urine: NEGATIVE ng/mL
COCAINE METABOLITES: NEGATIVE ng/mL
CREATININE, URINE: 181.99 mg/dL (ref >20.0)
Cannabinoid Scrn, Ur: NEGATIVE ng/mL
Carisoprodol, Urine: NEGATIVE ng/mL
ECSTASY: NEGATIVE ng/mL
FENTANYL URINE: NEGATIVE ng/mL
METHADONE SCREEN, URINE: NEGATIVE ng/mL
Meperidine, Ur: NEGATIVE ng/mL
Methaqualone: NEGATIVE ng/mL
Nitrites, Initial: NEGATIVE ug/mL
Opiate Screen, Urine: NEGATIVE ng/mL
Oxycodone Screen, Ur: NEGATIVE ng/mL
PH URINE, INITIAL: 7.2 pH (ref 4.5–8.9)
Phencyclidine, Ur: NEGATIVE ng/mL
Propoxyphene: NEGATIVE ng/mL
Tapentadol, urine: NEGATIVE ng/mL
Tramadol Scrn, Ur: NEGATIVE ng/mL
Zolpidem, Urine: NEGATIVE ng/mL

## 2015-08-11 LAB — GLUCOSE TOLERANCE, 1 HOUR (50G) W/O FASTING: GLUCOSE 1 HOUR GTT: 73 mg/dL (ref 70–140)

## 2015-08-12 LAB — HEMOGLOBINOPATHY EVALUATION
Hemoglobin Other: 0 %
Hgb A2 Quant: 2.8 % (ref 2.2–3.2)
Hgb A: 97.2 % (ref 96.8–97.8)
Hgb F Quant: 0 % (ref 0.0–2.0)
Hgb S Quant: 0 %

## 2015-08-12 LAB — URINE CULTURE

## 2015-08-12 LAB — GC/CHLAMYDIA PROBE AMP (~~LOC~~) NOT AT ARMC
CHLAMYDIA, DNA PROBE: NEGATIVE
Neisseria Gonorrhea: NEGATIVE

## 2015-08-15 LAB — CYTOLOGY - PAP

## 2015-08-16 ENCOUNTER — Encounter: Payer: Self-pay | Admitting: *Deleted

## 2015-08-19 ENCOUNTER — Other Ambulatory Visit (HOSPITAL_COMMUNITY): Payer: Self-pay

## 2015-08-24 ENCOUNTER — Telehealth: Payer: Self-pay | Admitting: General Practice

## 2015-08-24 DIAGNOSIS — Z3490 Encounter for supervision of normal pregnancy, unspecified, unspecified trimester: Secondary | ICD-10-CM

## 2015-08-24 MED ORDER — PRENATAL VITAMINS 0.8 MG PO TABS: 1.0000 | ORAL_TABLET | Freq: Every day | ORAL | Status: DC

## 2015-08-24 NOTE — Telephone Encounter (Signed)
Patient called and left message stating she needs another prescription for PNV. Patient states her insurance doesn't fully cover the one we prescribed. Switched PNV rx for patient. Called patient and informed her of new Rx. Patient verbalized understanding and had no questions

## 2015-09-07 ENCOUNTER — Encounter: Payer: Self-pay | Admitting: Physician Assistant

## 2015-09-07 ENCOUNTER — Encounter: Payer: Self-pay | Admitting: Obstetrics and Gynecology

## 2015-09-15 ENCOUNTER — Ambulatory Visit (INDEPENDENT_AMBULATORY_CARE_PROVIDER_SITE_OTHER): Payer: BC Managed Care – PPO | Admitting: Certified Nurse Midwife

## 2015-09-15 VITALS — BP 136/72 | HR 84 | Wt 276.0 lb

## 2015-09-15 DIAGNOSIS — Z3482 Encounter for supervision of other normal pregnancy, second trimester: Secondary | ICD-10-CM

## 2015-09-15 NOTE — Progress Notes (Signed)
Subjective:  Cathy Webster is a 24 y.o. [id number] at 753w0d being seen today for ongoing prenatal care.  She is currently monitored for the following issues for this low-risk pregnancy and has Previous cesarean delivery, delivered, with or without mention of antepartum condition; Short interval between pregnancies complicating pregnancy, antepartum; Abnormal Pap smear of cervix; History of cesarean section, low transverse; and Supervision of high risk pregnancy, antepartum on her problem list.  Patient reports no complaints.  Contractions: Not present. Vag. Bleeding: None.  Movement: Present. Denies leaking of fluid.   The following portions of the patient's history were reviewed and updated as appropriate: allergies, current medications, past family history, past medical history, past social history, past surgical history and problem list. Problem list updated.  Objective:   Filed Vitals:   09/15/15 1543  BP: 136/72  Pulse: 84  Weight: 276 lb (125.193 kg)    Fetal Status: Fetal Heart Rate (bpm): 150   Movement: Present     General:  Alert, oriented and cooperative. Patient is in no acute distress.  Skin: Skin is warm and dry. No rash noted.   Cardiovascular: Normal heart rate noted  Respiratory: Normal respiratory effort, no problems with respiration noted  Abdomen: Soft, gravid, appropriate for gestational age. Pain/Pressure: Absent     Pelvic: Vag. Bleeding: None     Cervical exam deferred        Extremities: Normal range of motion.  Edema: None  Mental Status: Normal mood and affect. Normal behavior. Normal judgment and thought content.   Urinalysis:      Assessment and Plan:  Pregnancy: G4P3003 at 7753w0d  1. Supervision of other normal pregnancy, antepartum, second trimester  - US OB Comp + 14 Wk; Future  Preterm labor symptoms and general obstetric precautions including but not limited to vaginal bleeding, contractions, leaking of fluid and fetal movement were reviewed in  detail with the patient. Please refer to After Visit Summary for other counseling recommendations.  No Follow-up on file.   Rhea PinkLori A Jamira Barfuss, CNM

## 2015-09-15 NOTE — Patient Instructions (Signed)

## 2015-09-29 ENCOUNTER — Ambulatory Visit (HOSPITAL_COMMUNITY)
Admission: RE | Admit: 2015-09-29 | Discharge: 2015-09-29 | Disposition: A | Discharge status: Home / Self Care | Payer: BC Managed Care – PPO | Source: Ambulatory Visit | Attending: Certified Nurse Midwife | Admitting: Certified Nurse Midwife

## 2015-09-29 ENCOUNTER — Other Ambulatory Visit: Payer: Self-pay | Admitting: Certified Nurse Midwife

## 2015-09-29 DIAGNOSIS — Z36 Encounter for antenatal screening of mother: Secondary | ICD-10-CM | POA: Insufficient documentation

## 2015-09-29 DIAGNOSIS — O34219 Maternal care for unspecified type scar from previous cesarean delivery: Secondary | ICD-10-CM | POA: Diagnosis not present

## 2015-09-29 DIAGNOSIS — O99212 Obesity complicating pregnancy, second trimester: Secondary | ICD-10-CM

## 2015-09-29 DIAGNOSIS — Z3689 Encounter for other specified antenatal screening: Secondary | ICD-10-CM

## 2015-09-29 DIAGNOSIS — Z3A20 20 weeks gestation of pregnancy: Secondary | ICD-10-CM

## 2015-09-29 DIAGNOSIS — O358XX0 Maternal care for other (suspected) fetal abnormality and damage, not applicable or unspecified: Secondary | ICD-10-CM

## 2015-09-29 DIAGNOSIS — O35EXX0 Maternal care for other (suspected) fetal abnormality and damage, fetal genitourinary anomalies, not applicable or unspecified: Secondary | ICD-10-CM

## 2015-09-29 DIAGNOSIS — Z3482 Encounter for supervision of other normal pregnancy, second trimester: Secondary | ICD-10-CM

## 2015-09-30 ENCOUNTER — Ambulatory Visit (HOSPITAL_COMMUNITY): Payer: BC Managed Care – PPO

## 2015-10-13 ENCOUNTER — Encounter: Payer: Self-pay | Admitting: Advanced Practice Midwife

## 2015-11-30 ENCOUNTER — Encounter: Payer: Self-pay | Admitting: Advanced Practice Midwife

## 2015-11-30 ENCOUNTER — Ambulatory Visit (INDEPENDENT_AMBULATORY_CARE_PROVIDER_SITE_OTHER): Payer: BC Managed Care – PPO | Admitting: Advanced Practice Midwife

## 2015-11-30 VITALS — BP 120/72 | HR 111 | Temp 98.0°F | Wt 278.3 lb

## 2015-11-30 DIAGNOSIS — O09893 Supervision of other high risk pregnancies, third trimester: Secondary | ICD-10-CM

## 2015-11-30 DIAGNOSIS — O35EXX0 Maternal care for other (suspected) fetal abnormality and damage, fetal genitourinary anomalies, not applicable or unspecified: Secondary | ICD-10-CM | POA: Insufficient documentation

## 2015-11-30 DIAGNOSIS — O358XX0 Maternal care for other (suspected) fetal abnormality and damage, not applicable or unspecified: Secondary | ICD-10-CM

## 2015-11-30 DIAGNOSIS — O0991 Supervision of high risk pregnancy, unspecified, first trimester: Secondary | ICD-10-CM

## 2015-11-30 DIAGNOSIS — O09892 Supervision of other high risk pregnancies, second trimester: Secondary | ICD-10-CM

## 2015-11-30 LAB — POCT URINALYSIS DIP (DEVICE)
BILIRUBIN URINE: NEGATIVE
GLUCOSE, UA: NEGATIVE mg/dL
Hgb urine dipstick: NEGATIVE
Ketones, ur: 15 mg/dL — AB
NITRITE: NEGATIVE
Protein, ur: NEGATIVE mg/dL
SPECIFIC GRAVITY, URINE: 1.02 (ref 1.005–1.030)
UROBILINOGEN UA: 0.2 mg/dL (ref 0.0–1.0)
pH: 7 (ref 5.0–8.0)

## 2015-11-30 MED ORDER — TETANUS-DIPHTH-ACELL PERTUSSIS 5-2.5-18.5 LF-MCG/0.5 IM SUSP: 0.5000 mL | Freq: Once | INTRAMUSCULAR | Status: DC

## 2015-11-30 NOTE — Progress Notes (Signed)
Subjective:  Cathy Webster is a 25 y.o. [phone] at [redacted]w[redacted]d being seen today for ongoing prenatal care.  She is currently monitored for the following issues for this low-risk pregnancy and has Previous cesarean delivery, delivered, with or without mention of antepartum condition; Short interval between pregnancies complicating pregnancy, antepartum; Abnormal Pap smear of cervix; History of cesarean section, low transverse; Supervision of high risk pregnancy, antepartum; and Kidney abnormality of fetus on prenatal ultrasound on her problem list.  Patient reports no complaints.  Contractions: Not present. Vag. Bleeding: None.  Movement: Present. Denies leaking of fluid.   The following portions of the patient's history were reviewed and updated as appropriate: allergies, current medications, past family history, past medical history, past social history, past surgical history and problem list. Problem list updated.  Objective:   Filed Vitals:   11/30/15 1124  BP: 120/72  Pulse: 111  Temp: 98 F (36.7 C)  Weight: 278 lb 4.8 oz (126.236 kg)    Fetal Status: Fetal Heart Rate (bpm): 140   Movement: Present     General:  Alert, oriented and cooperative. Patient is in no acute distress.  Skin: Skin is warm and dry. No rash noted.   Cardiovascular: Normal heart rate noted  Respiratory: Normal respiratory effort, no problems with respiration noted  Abdomen: Soft, gravid, appropriate for gestational age. Pain/Pressure: Absent     Pelvic: Vag. Bleeding: None     Cervical exam deferred        Extremities: Normal range of motion.  Edema: None  Mental Status: Normal mood and affect. Normal behavior. Normal judgment and thought content.   Urinalysis: Urine Protein: Negative Urine Glucose: Negative  Assessment and Plan:  Pregnancy: G4P3003 at [redacted]w[redacted]d  1. Short interval between pregnancies complicating pregnancy, antepartum, second trimester  - Flu Vaccine QUAD 36+ mos IM; Standing - Flu Vaccine  QUAD 36+ mos IM  2. Supervision of high risk pregnancy, antepartum, first trimester        3. Kidney abnormality of fetus on prenatal ultrasound      Noted December anatomy US findings after patient left. She needs a f/u US scheduled for bilateral dilation.  She is coming back for glucola, will schedule then.  Preterm labor symptoms and general obstetric precautions including but not limited to vaginal bleeding, contractions, leaking of fluid and fetal movement were reviewed in detail with the patient. Please refer to After Visit Summary for other counseling recommendations.  Return in about 2 weeks (around 12/14/2015) for obfu, also needs lab only for 1 hrGTT/28 wk labs., [facility].   Aviva Signs, CNM

## 2015-11-30 NOTE — Progress Notes (Signed)
Patient declines flu and tdap.

## 2015-11-30 NOTE — Patient Instructions (Signed)
Levonorgestrel intrauterine device (IUD) What is this medicine? LEVONORGESTREL IUD (LEE voe nor jes trel) is a contraceptive (birth control) device. The device is placed inside the uterus by a healthcare professional. It is used to prevent pregnancy and can also be used to treat heavy bleeding that occurs during your period. Depending on the device, it can be used for 3 to 5 years. This medicine may be used for other purposes; ask your health care provider or pharmacist if you have questions. What should I tell my health care provider before I take this medicine? They need to know if you have any of these conditions: -abnormal Pap smear -cancer of the breast, uterus, or cervix -diabetes -endometritis -genital or pelvic infection now or in the past -have more than one sexual partner or your partner has more than one partner -heart disease -history of an ectopic or tubal pregnancy -immune system problems -IUD in place -liver disease or tumor -problems with blood clots or take blood-thinners -use intravenous drugs -uterus of unusual shape -vaginal bleeding that has not been explained -an unusual or allergic reaction to levonorgestrel, other hormones, silicone, or polyethylene, medicines, foods, dyes, or preservatives -pregnant or trying to get pregnant -breast-feeding How should I use this medicine? This device is placed inside the uterus by a health care professional. Talk to your pediatrician regarding the use of this medicine in children. Special care may be needed. Overdosage: If you think you have taken too much of this medicine contact a poison control center or emergency room at once. NOTE: This medicine is only for you. Do not share this medicine with others. What if I miss a dose? This does not apply. What may interact with this medicine? Do not take this medicine with any of the following medications: -amprenavir -bosentan -fosamprenavir This medicine may also interact with  the following medications: -aprepitant -barbiturate medicines for inducing sleep or treating seizures -bexarotene -griseofulvin -medicines to treat seizures like carbamazepine, ethotoin, felbamate, oxcarbazepine, phenytoin, topiramate -modafinil -pioglitazone -rifabutin -rifampin -rifapentine -some medicines to treat HIV infection like atazanavir, indinavir, lopinavir, nelfinavir, tipranavir, ritonavir -St. John's wort -warfarin This list may not describe all possible interactions. Give your health care provider a list of all the medicines, herbs, non-prescription drugs, or dietary supplements you use. Also tell them if you smoke, drink alcohol, or use illegal drugs. Some items may interact with your medicine. What should I watch for while using this medicine? Visit your doctor or health care professional for regular check ups. See your doctor if you or your partner has sexual contact with others, becomes HIV positive, or gets a sexual transmitted disease. This product does not protect you against HIV infection (AIDS) or other sexually transmitted diseases. You can check the placement of the IUD yourself by reaching up to the top of your vagina with clean fingers to feel the threads. Do not pull on the threads. It is a good habit to check placement after each menstrual period. Call your doctor right away if you feel more of the IUD than just the threads or if you cannot feel the threads at all. The IUD may come out by itself. You may become pregnant if the device comes out. If you notice that the IUD has come out use a backup birth control method like condoms and call your health care provider. Using tampons will not change the position of the IUD and are okay to use during your period. What side effects may I notice from receiving this medicine?   Side effects that you should report to your doctor or health care professional as soon as possible: -allergic reactions like skin rash, itching or  hives, swelling of the face, lips, or tongue -fever, flu-like symptoms -genital sores -high blood pressure -no menstrual period for 6 weeks during use -pain, swelling, warmth in the leg -pelvic pain or tenderness -severe or sudden headache -signs of pregnancy -stomach cramping -sudden shortness of breath -trouble with balance, talking, or walking -unusual vaginal bleeding, discharge -yellowing of the eyes or skin Side effects that usually do not require medical attention (report to your doctor or health care professional if they continue or are bothersome): -acne -breast pain -change in sex drive or performance -changes in weight -cramping, dizziness, or faintness while the device is being inserted -headache -irregular menstrual bleeding within first 3 to 6 months of use -nausea This list may not describe all possible side effects. Call your doctor for medical advice about side effects. You may report side effects to FDA at 1-800-FDA-1088. Where should I keep my medicine? This does not apply. NOTE: This sheet is a summary. It may not cover all possible information. If you have questions about this medicine, talk to your doctor, pharmacist, or health care provider.    2016, Elsevier/Gold Standard. (2011-10-25 13:54:04)  

## 2015-12-06 ENCOUNTER — Other Ambulatory Visit: Payer: BC Managed Care – PPO

## 2015-12-06 ENCOUNTER — Other Ambulatory Visit: Payer: Self-pay

## 2015-12-06 DIAGNOSIS — O35EXX0 Maternal care for other (suspected) fetal abnormality and damage, fetal genitourinary anomalies, not applicable or unspecified: Secondary | ICD-10-CM

## 2015-12-06 DIAGNOSIS — O0990 Supervision of high risk pregnancy, unspecified, unspecified trimester: Secondary | ICD-10-CM

## 2015-12-06 DIAGNOSIS — O358XX0 Maternal care for other (suspected) fetal abnormality and damage, not applicable or unspecified: Secondary | ICD-10-CM

## 2015-12-06 LAB — CBC
HEMATOCRIT: 33.7 % — AB (ref 36.0–46.0)
HEMOGLOBIN: 11.6 g/dL — AB (ref 12.0–15.0)
MCH: 27.9 pg (ref 26.0–34.0)
MCHC: 34.4 g/dL (ref 30.0–36.0)
MCV: 81 fL (ref 78.0–100.0)
MPV: 9.9 fL (ref 8.6–12.4)
Platelets: 153 10*3/uL (ref 150–400)
RBC: 4.16 MIL/uL (ref 3.87–5.11)
RDW: 14 % (ref 11.5–15.5)
WBC: 7.1 10*3/uL (ref 4.0–10.5)

## 2015-12-07 ENCOUNTER — Other Ambulatory Visit: Payer: Self-pay | Admitting: Advanced Practice Midwife

## 2015-12-07 ENCOUNTER — Ambulatory Visit (HOSPITAL_COMMUNITY)
Admission: RE | Admit: 2015-12-07 | Discharge: 2015-12-07 | Disposition: A | Discharge status: Home / Self Care | Payer: BC Managed Care – PPO | Source: Ambulatory Visit | Attending: Advanced Practice Midwife | Admitting: Advanced Practice Midwife

## 2015-12-07 ENCOUNTER — Other Ambulatory Visit: Payer: Self-pay

## 2015-12-07 ENCOUNTER — Encounter (HOSPITAL_COMMUNITY): Payer: Self-pay

## 2015-12-07 VITALS — BP 132/73 | HR 105 | Wt 282.4 lb

## 2015-12-07 DIAGNOSIS — O34219 Maternal care for unspecified type scar from previous cesarean delivery: Secondary | ICD-10-CM | POA: Diagnosis not present

## 2015-12-07 DIAGNOSIS — Z3A29 29 weeks gestation of pregnancy: Secondary | ICD-10-CM | POA: Diagnosis not present

## 2015-12-07 DIAGNOSIS — O99213 Obesity complicating pregnancy, third trimester: Secondary | ICD-10-CM | POA: Diagnosis not present

## 2015-12-07 DIAGNOSIS — Z0489 Encounter for examination and observation for other specified reasons: Secondary | ICD-10-CM

## 2015-12-07 DIAGNOSIS — IMO0002 Reserved for concepts with insufficient information to code with codable children: Secondary | ICD-10-CM

## 2015-12-07 DIAGNOSIS — Z36 Encounter for antenatal screening of mother: Secondary | ICD-10-CM | POA: Diagnosis not present

## 2015-12-07 DIAGNOSIS — O283 Abnormal ultrasonic finding on antenatal screening of mother: Secondary | ICD-10-CM | POA: Diagnosis not present

## 2015-12-07 DIAGNOSIS — O358XX0 Maternal care for other (suspected) fetal abnormality and damage, not applicable or unspecified: Secondary | ICD-10-CM

## 2015-12-07 DIAGNOSIS — O0993 Supervision of high risk pregnancy, unspecified, third trimester: Secondary | ICD-10-CM

## 2015-12-07 DIAGNOSIS — O35EXX0 Maternal care for other (suspected) fetal abnormality and damage, fetal genitourinary anomalies, not applicable or unspecified: Secondary | ICD-10-CM

## 2015-12-07 LAB — GLUCOSE TOLERANCE, 1 HOUR (50G) W/O FASTING: Glucose, 1 Hr, gestational: 76 mg/dL (ref <140)

## 2015-12-07 LAB — HIV ANTIBODY (ROUTINE TESTING W REFLEX): HIV 1&2 Ab, 4th Generation: NONREACTIVE

## 2015-12-07 LAB — RPR

## 2015-12-14 ENCOUNTER — Encounter: Payer: Self-pay | Admitting: Advanced Practice Midwife

## 2015-12-19 ENCOUNTER — Telehealth: Payer: Self-pay | Admitting: Family Medicine

## 2015-12-19 ENCOUNTER — Encounter: Payer: Self-pay | Admitting: Obstetrics & Gynecology

## 2015-12-19 ENCOUNTER — Encounter: Payer: Self-pay | Admitting: Family Medicine

## 2015-12-19 NOTE — Telephone Encounter (Signed)
Called patient regarding missed ob follow up appointment. Patient did not answer and does not have mailbox, mailing certified letter.

## 2016-01-18 ENCOUNTER — Ambulatory Visit (INDEPENDENT_AMBULATORY_CARE_PROVIDER_SITE_OTHER): Payer: BC Managed Care – PPO | Admitting: Certified Nurse Midwife

## 2016-01-18 VITALS — BP 130/71 | HR 105 | Wt 282.2 lb

## 2016-01-18 DIAGNOSIS — O35EXX0 Maternal care for other (suspected) fetal abnormality and damage, fetal genitourinary anomalies, not applicable or unspecified: Secondary | ICD-10-CM

## 2016-01-18 DIAGNOSIS — Z113 Encounter for screening for infections with a predominantly sexual mode of transmission: Secondary | ICD-10-CM | POA: Diagnosis not present

## 2016-01-18 DIAGNOSIS — O358XX0 Maternal care for other (suspected) fetal abnormality and damage, not applicable or unspecified: Secondary | ICD-10-CM

## 2016-01-18 DIAGNOSIS — O0993 Supervision of high risk pregnancy, unspecified, third trimester: Secondary | ICD-10-CM

## 2016-01-18 LAB — POCT URINALYSIS DIP (DEVICE)
BILIRUBIN URINE: NEGATIVE
Glucose, UA: NEGATIVE mg/dL
HGB URINE DIPSTICK: NEGATIVE
KETONES UR: 15 mg/dL — AB
Nitrite: NEGATIVE
PH: 6.5 (ref 5.0–8.0)
Protein, ur: NEGATIVE mg/dL
Specific Gravity, Urine: 1.02 (ref 1.005–1.030)
Urobilinogen, UA: 0.2 mg/dL (ref 0.0–1.0)

## 2016-01-18 LAB — OB RESULTS CONSOLE GC/CHLAMYDIA: GC PROBE AMP, GENITAL: NEGATIVE

## 2016-01-18 LAB — OB RESULTS CONSOLE GBS: STREP GROUP B AG: POSITIVE

## 2016-01-18 NOTE — Progress Notes (Signed)
Pt reports sharp, shooting pain in Lt hip/buttock.  Breastfeeding tip of the week reviewed.

## 2016-01-19 LAB — GC/CHLAMYDIA PROBE AMP (~~LOC~~) NOT AT ARMC
Chlamydia: NEGATIVE
Neisseria Gonorrhea: NEGATIVE

## 2016-01-20 LAB — CULTURE, BETA STREP (GROUP B ONLY)

## 2016-01-24 NOTE — Progress Notes (Signed)
Subjective:  Cathy Webster is a 25 y.o. [phone] at 5395w5d being seen today for ongoing prenatal care.  She is currently monitored for the following issues for this high-risk pregnancy and has Previous cesarean delivery, delivered, with or without mention of antepartum condition; Short interval between pregnancies complicating pregnancy, antepartum; Abnormal Pap smear of cervix; History of cesarean section, low transverse; Supervision of high risk pregnancy, antepartum; and Kidney abnormality of fetus on prenatal ultrasound on her problem list.  Patient reports no complaints.  Contractions: Not present. Vag. Bleeding: None.  Movement: Present. Denies leaking of fluid.   The following portions of the patient's history were reviewed and updated as appropriate: allergies, current medications, past family history, past medical history, past social history, past surgical history and problem list. Problem list updated.  Objective:   Filed Vitals:   01/18/16 1131  BP: 130/71  Pulse: 105  Weight: 282 lb 3.2 oz (128.005 kg)    Fetal Status: Fetal Heart Rate (bpm): 136   Movement: Present     General:  Alert, oriented and cooperative. Patient is in no acute distress.  Skin: Skin is warm and dry. No rash noted.   Cardiovascular: Normal heart rate noted  Respiratory: Normal respiratory effort, no problems with respiration noted  Abdomen: Soft, gravid, appropriate for gestational age. Pain/Pressure: Present     Pelvic: Vag. Bleeding: None     Cervical exam deferred        Extremities: Normal range of motion.  Edema: Mild pitting, slight indentation  Mental Status: Normal mood and affect. Normal behavior. Normal judgment and thought content.   Urinalysis: Urine Protein: Negative Urine Glucose: Negative  Assessment and Plan:  Pregnancy: G4P3003 at 7695w5d  1. Supervision of high-risk pregnancy, third trimester  - GC/Chlamydia probe amp ([place])not at [facility] - Culture, beta strep (group b  only)  2. Kidney abnormality of fetus on prenatal ultrasound   Preterm labor symptoms and general obstetric precautions including but not limited to vaginal bleeding, contractions, leaking of fluid and fetal movement were reviewed in detail with the patient. Please refer to After Visit Summary for other counseling recommendations.  [facility] (around [facility]2016[facility]   Rhea PinkLori A Clemmons, CNM

## 2016-01-26 ENCOUNTER — Encounter: Payer: Self-pay | Admitting: Obstetrics & Gynecology

## 2016-01-26 ENCOUNTER — Ambulatory Visit (INDEPENDENT_AMBULATORY_CARE_PROVIDER_SITE_OTHER): Payer: BC Managed Care – PPO | Admitting: Medical

## 2016-01-26 ENCOUNTER — Encounter: Payer: Self-pay | Admitting: Medical

## 2016-01-26 VITALS — BP 135/71 | HR 100 | Wt 285.1 lb

## 2016-01-26 DIAGNOSIS — M5432 Sciatica, left side: Secondary | ICD-10-CM

## 2016-01-26 DIAGNOSIS — O0993 Supervision of high risk pregnancy, unspecified, third trimester: Secondary | ICD-10-CM

## 2016-01-26 LAB — POCT URINALYSIS DIP (DEVICE)
BILIRUBIN URINE: NEGATIVE
GLUCOSE, UA: NEGATIVE mg/dL
Hgb urine dipstick: NEGATIVE
KETONES UR: NEGATIVE mg/dL
Nitrite: NEGATIVE
PROTEIN: NEGATIVE mg/dL
Specific Gravity, Urine: 1.025 (ref 1.005–1.030)
Urobilinogen, UA: 0.2 mg/dL (ref 0.0–1.0)
pH: 6.5 (ref 5.0–8.0)

## 2016-01-26 MED ORDER — OXYCODONE-ACETAMINOPHEN 5-325 MG PO TABS: 1.0000 | ORAL_TABLET | Freq: Four times a day (QID) | ORAL | Status: DC | PRN

## 2016-01-26 NOTE — Progress Notes (Signed)
Subjective:  Cathy Webster is a 25 y.o. [phone] at 2612w0d being seen today for ongoing prenatal care.  She is currently monitored for the following issues for this high-risk pregnancy and has Previous cesarean delivery, delivered, with or without mention of antepartum condition; Short interval between pregnancies complicating pregnancy, antepartum; Abnormal Pap smear of cervix; History of cesarean section, low transverse; Supervision of high risk pregnancy, antepartum; and Kidney abnormality of fetus on prenatal ultrasound on her problem list.  Patient reports backache/sciatica.  Contractions: Not present. Vag. Bleeding: None.  Movement: Present. Denies leaking of fluid.   The following portions of the patient's history were reviewed and updated as appropriate: allergies, current medications, past family history, past medical history, past social history, past surgical history and problem list. Problem list updated.  Objective:   Filed Vitals:   01/26/16 1515  BP: 135/71  Pulse: 100  Weight: 285 lb 1.6 oz (129.321 kg)    Fetal Status: Fetal Heart Rate (bpm): 140 Fundal Height: 38 cm Movement: Present     General:  Alert, oriented and cooperative. Patient is in no acute distress.  Skin: Skin is warm and dry. No rash noted.   Cardiovascular: Normal heart rate noted  Respiratory: Normal respiratory effort, no problems with respiration noted  Abdomen: Soft, gravid, appropriate for gestational age. Pain/Pressure: Present     Pelvic: Vag. Bleeding: None     Cervical exam deferred        Extremities: Normal range of motion.  Edema: None  Mental Status: Normal mood and affect. Normal behavior. Normal judgment and thought content.   Urinalysis: Neg/Neg  Assessment and Plan:  Pregnancy: G4P3003 at 2612w0d  1. Supervision of high risk pregnancy, antepartum, third trimester Patient informed +GBS from last visit, will need PCN in labor  2. Sciatica of left side - oxyCODONE-acetaminophen  (PERCOCET/ROXICET) 5-325 MG tablet; Take 1-2 tablets by mouth every 6 (six) hours as needed for severe pain.  Dispense: 15 tablet; Refill: 0 - discussed use of heat/ice to the area, appropriate sleeping positions and moderation of activity and heavy lifting  Term labor symptoms and general obstetric precautions including but not limited to vaginal bleeding, contractions, leaking of fluid and fetal movement were reviewed in detail with the patient. Please refer to After Visit Summary for other counseling recommendations.  Return in about 1 week (around 02/02/2016) for [facility].   Marny LowensteinJulie N Kallen Mccrystal, PA-C

## 2016-01-26 NOTE — Patient Instructions (Signed)
Braxton Hicks Contractions °Contractions of the uterus can occur throughout pregnancy. Contractions are not always a sign that you are in labor.  °WHAT ARE BRAXTON HICKS CONTRACTIONS?  °Contractions that occur before labor are called Braxton Hicks contractions, or false labor. Toward the end of pregnancy (32-34 weeks), these contractions can develop more often and may become more forceful. This is not true labor because these contractions do not result in opening (dilatation) and thinning of the cervix. They are sometimes difficult to tell apart from true labor because these contractions can be forceful and people have different pain tolerances. You should not feel embarrassed if you go to the hospital with false labor. Sometimes, the only way to tell if you are in true labor is for your health care provider to look for changes in the cervix. °If there are no prenatal problems or other health problems associated with the pregnancy, it is completely safe to be sent home with false labor and await the onset of true labor. °HOW CAN YOU TELL THE DIFFERENCE BETWEEN TRUE AND FALSE LABOR? °False Labor °· The contractions of false labor are usually shorter and not as hard as those of true labor.   °· The contractions are usually irregular.   °· The contractions are often felt in the front of the lower abdomen and in the groin.   °· The contractions may go away when you walk around or change positions while lying down.   °· The contractions get weaker and are shorter lasting as time goes on.   °· The contractions do not usually become progressively stronger, regular, and closer together as with true labor.   °True Labor °· Contractions in true labor last 30-70 seconds, become very regular, usually become more intense, and increase in frequency.   °· The contractions do not go away with walking.   °· The discomfort is usually felt in the top of the uterus and spreads to the lower abdomen and low back.   °· True labor can be  determined by your health care provider with an exam. This will show that the cervix is dilating and getting thinner.   °WHAT TO REMEMBER °· Keep up with your usual exercises and follow other instructions given by your health care provider.   °· Take medicines as directed by your health care provider.   °· Keep your regular prenatal appointments.   °· Eat and drink lightly if you think you are going into labor.   °· If Braxton Hicks contractions are making you uncomfortable:   °¨ Change your position from lying down or resting to walking, or from walking to resting.   °¨ Sit and rest in a tub of warm water.   °¨ Drink 2-3 glasses of water. Dehydration may cause these contractions.   °¨ Do slow and deep breathing several times an hour.   °WHEN SHOULD I SEEK IMMEDIATE MEDICAL CARE? °Seek immediate medical care if: °· Your contractions become stronger, more regular, and closer together.   °· You have fluid leaking or gushing from your vagina.   °· You have a fever.   °· You pass blood-tinged mucus.   °· You have vaginal bleeding.   °· You have continuous abdominal pain.   °· You have low back pain that you never had before.   °· You feel your baby's head pushing down and causing pelvic pressure.   °· Your baby is not moving as much as it used to.   °  °This information is not intended to replace advice given to you by your health care provider. Make sure you discuss any questions you have with your health care   provider. °  °Document Released: 09/24/2005 Document Revised: 09/29/2013 Document Reviewed: 07/06/2013 °Elsevier Interactive Patient Education ©2016 [employer]vier Inc. °Fetal Movement Counts °Patient Name: __________________________________________________ Patient Due Date: ____________________ °Performing a fetal movement count is highly recommended in high-risk pregnancies, but it is good for every pregnant woman to do. Your health care provider may ask you to start counting fetal movements at 28 weeks of the  pregnancy. Fetal movements often increase: °· After eating a full meal. °· After physical activity. °· After eating or drinking something sweet or cold. °· At rest. °Pay attention to when you feel the baby is most active. This will help you notice a pattern of your baby's sleep and wake cycles and what factors contribute to an increase in fetal movement. It is important to perform a fetal movement count at the same time each day when your baby is normally most active.  °HOW TO COUNT FETAL MOVEMENTS °· Find a quiet and comfortable area to sit or lie down on your left side. Lying on your left side provides the best blood and oxygen circulation to your baby. °· Write down the day and time on a sheet of paper or in a journal. °· Start counting kicks, flutters, swishes, rolls, or jabs in a 2-hour period. You should feel at least 10 movements within 2 hours. °· If you do not feel 10 movements in 2 hours, wait 2-3 hours and count again. Look for a change in the pattern or not enough counts in 2 hours. °SEEK MEDICAL CARE IF: °· You feel less than 10 counts in 2 hours, tried twice. °· There is no movement in over an hour. °· The pattern is changing or taking longer each day to reach 10 counts in 2 hours. °· You feel the baby is not moving as he or she usually does. °Date: ____________ Movements: ____________ Start time: ____________ Finish time: ____________  °Date: ____________ Movements: ____________ Start time: ____________ Finish time: ____________ °Date: ____________ Movements: ____________ Start time: ____________ Finish time: ____________ °Date: ____________ Movements: ____________ Start time: ____________ Finish time: ____________ °Date: ____________ Movements: ____________ Start time: ____________ Finish time: ____________ °Date: ____________ Movements: ____________ Start time: ____________ Finish time: ____________ °Date: ____________ Movements: ____________ Start time: ____________ Finish time: ____________ °Date:  ____________ Movements: ____________ Start time: ____________ Finish time: ____________  °Date: ____________ Movements: ____________ Start time: ____________ Finish time: ____________ °Date: ____________ Movements: ____________ Start time: ____________ Finish time: ____________ °Date: ____________ Movements: ____________ Start time: ____________ Finish time: ____________ °Date: ____________ Movements: ____________ Start time: ____________ Finish time: ____________ °Date: ____________ Movements: ____________ Start time: ____________ Finish time: ____________ °Date: ____________ Movements: ____________ Start time: ____________ Finish time: ____________ °Date: ____________ Movements: ____________ Start time: ____________ Finish time: ____________  °Date: ____________ Movements: ____________ Start time: ____________ Finish time: ____________ °Date: ____________ Movements: ____________ Start time: ____________ Finish time: ____________ °Date: ____________ Movements: ____________ Start time: ____________ Finish time: ____________ °Date: ____________ Movements: ____________ Start time: ____________ Finish time: ____________ °Date: ____________ Movements: ____________ Start time: ____________ Finish time: ____________ °Date: ____________ Movements: ____________ Start time: ____________ Finish time: ____________ °Date: ____________ Movements: ____________ Start time: ____________ Finish time: ____________  °Date: ____________ Movements: ____________ Start time: ____________ Finish time: ____________ °Date: ____________ Movements: ____________ Start time: ____________ Finish time: ____________ °Date: ____________ Movements: ____________ Start time: ____________ Finish time: ____________ °Date: ____________ Movements: ____________ Start time: ____________ Finish time: ____________ °Date: ____________ Movements: ____________ Start time: ____________ Finish time: ____________ °Date: ____________ Movements: ____________ Start  time: ____________ Finish time:   ____________ °Date: ____________ Movements: ____________ Start time: ____________ Finish time: ____________  °Date: ____________ Movements: ____________ Start time: ____________ Finish time: ____________ °Date: ____________ Movements: ____________ Start time: ____________ Finish time: ____________ °Date: ____________ Movements: ____________ Start time: ____________ Finish time: ____________ °Date: ____________ Movements: ____________ Start time: ____________ Finish time: ____________ °Date: ____________ Movements: ____________ Start time: ____________ Finish time: ____________ °Date: ____________ Movements: ____________ Start time: ____________ Finish time: ____________ °Date: ____________ Movements: ____________ Start time: ____________ Finish time: ____________  °Date: ____________ Movements: ____________ Start time: ____________ Finish time: ____________ °Date: ____________ Movements: ____________ Start time: ____________ Finish time: ____________ °Date: ____________ Movements: ____________ Start time: ____________ Finish time: ____________ °Date: ____________ Movements: ____________ Start time: ____________ Finish time: ____________ °Date: ____________ Movements: ____________ Start time: ____________ Finish time: ____________ °Date: ____________ Movements: ____________ Start time: ____________ Finish time: ____________ °Date: ____________ Movements: ____________ Start time: ____________ Finish time: ____________  °Date: ____________ Movements: ____________ Start time: ____________ Finish time: ____________ °Date: ____________ Movements: ____________ Start time: ____________ Finish time: ____________ °Date: ____________ Movements: ____________ Start time: ____________ Finish time: ____________ °Date: ____________ Movements: ____________ Start time: ____________ Finish time: ____________ °Date: ____________ Movements: ____________ Start time: ____________ Finish time:  ____________ °Date: ____________ Movements: ____________ Start time: ____________ Finish time: ____________ °Date: ____________ Movements: ____________ Start time: ____________ Finish time: ____________  °Date: ____________ Movements: ____________ Start time: ____________ Finish time: ____________ °Date: ____________ Movements: ____________ Start time: ____________ Finish time: ____________ °Date: ____________ Movements: ____________ Start time: ____________ Finish time: ____________ °Date: ____________ Movements: ____________ Start time: ____________ Finish time: ____________ °Date: ____________ Movements: ____________ Start time: ____________ Finish time: ____________ °Date: ____________ Movements: ____________ Start time: ____________ Finish time: ____________ °  °This information is not intended to replace advice given to you by your health care provider. Make sure you discuss any questions you have with your health care provider. °  °Document Released: 10/24/2006 Document Revised: 10/15/2014 Document Reviewed: 07/21/2012 °Elsevier Interactive Patient Education ©2016 Elsevier Inc. ° °

## 2016-02-01 ENCOUNTER — Encounter: Payer: Self-pay | Admitting: *Deleted

## 2016-02-01 ENCOUNTER — Ambulatory Visit (INDEPENDENT_AMBULATORY_CARE_PROVIDER_SITE_OTHER): Payer: BC Managed Care – PPO | Admitting: Family

## 2016-02-01 VITALS — BP 111/71 | HR 103 | Temp 98.0°F | Wt 288.8 lb

## 2016-02-01 DIAGNOSIS — O0993 Supervision of high risk pregnancy, unspecified, third trimester: Secondary | ICD-10-CM

## 2016-02-01 DIAGNOSIS — O0990 Supervision of high risk pregnancy, unspecified, unspecified trimester: Secondary | ICD-10-CM

## 2016-02-01 DIAGNOSIS — Z3483 Encounter for supervision of other normal pregnancy, third trimester: Secondary | ICD-10-CM | POA: Diagnosis not present

## 2016-02-01 DIAGNOSIS — Z98891 History of uterine scar from previous surgery: Secondary | ICD-10-CM

## 2016-02-01 LAB — POCT URINALYSIS DIP (DEVICE)
BILIRUBIN URINE: NEGATIVE
GLUCOSE, UA: NEGATIVE mg/dL
Hgb urine dipstick: NEGATIVE
KETONES UR: NEGATIVE mg/dL
LEUKOCYTES UA: NEGATIVE
NITRITE: NEGATIVE
Protein, ur: 30 mg/dL — AB
SPECIFIC GRAVITY, URINE: 1.02 (ref 1.005–1.030)
UROBILINOGEN UA: 1 mg/dL (ref 0.0–1.0)
pH: 7.5 (ref 5.0–8.0)

## 2016-02-01 NOTE — Progress Notes (Signed)
Denies blurry vision.C/o always has headaches when pregnant all of pregnancy, not worse this week.

## 2016-02-01 NOTE — Progress Notes (Signed)
Subjective:  Cathy Webster is a 25 y.o. [phone] at 2540w6d being seen today for ongoing prenatal care.  She is currently monitored for the following issues for this low-risk pregnancy and has Previous cesarean delivery, delivered, with or without mention of antepartum condition; Short interval between pregnancies complicating pregnancy, antepartum; Abnormal Pap smear of cervix; History of cesarean section, low transverse; Supervision of high risk pregnancy, antepartum; and Kidney abnormality of fetus on prenatal ultrasound on her problem list.  Patient reports occasional contractions.  Contractions: Irregular. Vag. Bleeding: None.  Movement: Present. Denies leaking of fluid.   The following portions of the patient's history were reviewed and updated as appropriate: allergies, current medications, past family history, past medical history, past social history, past surgical history and problem list. Problem list updated.  Objective:   Filed Vitals:   02/01/16 1529  BP: 111/71  Pulse: 103  Temp: 98 F (36.7 C)  Weight: 288 lb 12.8 oz (130.999 kg)    Fetal Status: Fetal Heart Rate (bpm): 150 Fundal Height: 37 cm Movement: Present  Presentation: Vertex  General:  Alert, oriented and cooperative. Patient is in no acute distress.  Skin: Skin is warm and dry. No rash noted.   Cardiovascular: Normal heart rate noted  Respiratory: Normal respiratory effort, no problems with respiration noted  Abdomen: Soft, gravid, appropriate for gestational age. Pain/Pressure: Present     Pelvic: Vag. Bleeding: None     Cervical exam performed Dilation: 1   Station: -3  Extremities: Normal range of motion.  Edema: Mild pitting, slight indentation  Mental Status: Normal mood and affect. Normal behavior. Normal judgment and thought content.   Urinalysis: Urine Protein: 1+ Urine Glucose: Negative  Assessment and Plan:  Pregnancy: G4P3003 at 440w6d  1. Supervision of high risk pregnancy, antepartum, third  trimester - Reviewed GBS results  2. History of cesarean section, low transverse - TOLAC consent in [place]  Term labor symptoms and general obstetric precautions including but not limited to vaginal bleeding, contractions, leaking of fluid and fetal movement were reviewed in detail with the patient. Please refer to After Visit Summary for other counseling recommendations.  Return in about 1 week (around 02/08/2016).   Eino FarberWalidah Kennith GainN Karim, CNM

## 2016-02-09 ENCOUNTER — Ambulatory Visit (INDEPENDENT_AMBULATORY_CARE_PROVIDER_SITE_OTHER): Payer: BC Managed Care – PPO | Admitting: Family

## 2016-02-09 VITALS — BP 128/73 | HR 96 | Wt 287.2 lb

## 2016-02-09 DIAGNOSIS — O34219 Maternal care for unspecified type scar from previous cesarean delivery: Secondary | ICD-10-CM

## 2016-02-09 DIAGNOSIS — O0993 Supervision of high risk pregnancy, unspecified, third trimester: Secondary | ICD-10-CM

## 2016-02-09 DIAGNOSIS — Z98891 History of uterine scar from previous surgery: Secondary | ICD-10-CM

## 2016-02-09 LAB — POCT URINALYSIS DIP (DEVICE)
BILIRUBIN URINE: NEGATIVE
GLUCOSE, UA: NEGATIVE mg/dL
Hgb urine dipstick: NEGATIVE
Nitrite: NEGATIVE
PROTEIN: 30 mg/dL — AB
SPECIFIC GRAVITY, URINE: 1.02 (ref 1.005–1.030)
Urobilinogen, UA: 1 mg/dL (ref 0.0–1.0)
pH: 7.5 (ref 5.0–8.0)

## 2016-02-09 NOTE — Progress Notes (Signed)
Subjective:  Cathy Webster is a 25 y.o. [phone] at 3648w0d being seen today for ongoing prenatal care.  She is currently monitored for the following issues for this high-risk pregnancy and has Previous cesarean delivery, delivered, with or without mention of antepartum condition; Short interval between pregnancies complicating pregnancy, antepartum; Abnormal Pap smear of cervix; History of cesarean section, low transverse; Supervision of high risk pregnancy, antepartum; and Kidney abnormality of fetus on prenatal ultrasound on her problem list.  Patient reports no complaints.  Contractions: Irregular. Vag. Bleeding: None.  Movement: Present. Denies leaking of fluid.   The following portions of the patient's history were reviewed and updated as appropriate: allergies, current medications, past family history, past medical history, past social history, past surgical history and problem list. Problem list updated.  Objective:   Filed Vitals:   02/09/16 1110  BP: 128/73  Pulse: 96  Weight: 287 lb 3.2 oz (130.273 kg)    Fetal Status: Fetal Heart Rate (bpm): 134 Fundal Height: 39 cm Movement: Present  Presentation: Vertex  General:  Alert, oriented and cooperative. Patient is in no acute distress.  Skin: Skin is warm and dry. No rash noted.   Cardiovascular: Normal heart rate noted  Respiratory: Normal respiratory effort, no problems with respiration noted  Abdomen: Soft, gravid, appropriate for gestational age. Pain/Pressure: Present     Pelvic: Vag. Bleeding: None     Cervical exam performed Dilation: 1 Effacement (%): Thick Station: -3  Extremities: Normal range of motion.  Edema: Mild pitting, slight indentation  Mental Status: Normal mood and affect. Normal behavior. Normal judgment and thought content.   Urinalysis: Urine Protein: 1+ Urine Glucose: Negative  Assessment and Plan:  Pregnancy: [id number] at 2348w0d  1. Supervision of high risk pregnancy, antepartum, third trimester -  Continued monitoring  2. History of cesarean section, low transverse - Plans TOLAC  Term labor symptoms and general obstetric precautions including but not limited to vaginal bleeding, contractions, leaking of fluid and fetal movement were reviewed in detail with the patient. Please refer to After Visit Summary for other counseling recommendations.  Return in about 1 week (around 02/16/2016).   Eino FarberWalidah Kennith GainN Karim, CNM

## 2016-02-10 ENCOUNTER — Inpatient Hospital Stay (HOSPITAL_COMMUNITY)
Admission: AD | Admit: 2016-02-10 | Discharge: 2016-02-10 | Disposition: A | Discharge status: Home / Self Care | Payer: BC Managed Care – PPO | Source: Ambulatory Visit | Attending: Obstetrics & Gynecology | Admitting: Obstetrics & Gynecology

## 2016-02-10 ENCOUNTER — Encounter (HOSPITAL_COMMUNITY): Payer: Self-pay

## 2016-02-10 DIAGNOSIS — O471 False labor at or after 37 completed weeks of gestation: Secondary | ICD-10-CM | POA: Insufficient documentation

## 2016-02-10 DIAGNOSIS — Z3A37 37 weeks gestation of pregnancy: Secondary | ICD-10-CM | POA: Diagnosis not present

## 2016-02-10 DIAGNOSIS — O0993 Supervision of high risk pregnancy, unspecified, third trimester: Secondary | ICD-10-CM

## 2016-02-10 DIAGNOSIS — O479 False labor, unspecified: Secondary | ICD-10-CM

## 2016-02-10 NOTE — Discharge Instructions (Signed)
Third Trimester of Pregnancy °The third trimester is from week 29 through week 42, months 7 through 9. This trimester is when your unborn baby (fetus) is growing very fast. At the end of the ninth month, the unborn baby is about 20 inches in length. It weighs about 6-10 pounds.  °HOME CARE  °· Avoid all smoking, herbs, and alcohol. Avoid drugs not approved by your doctor. °· Do not use any tobacco products, including cigarettes, chewing tobacco, and electronic cigarettes. If you need help quitting, ask your doctor. You may get counseling or other support to help you quit. °· Only take medicine as told by your doctor. Some medicines are safe and some are not during pregnancy. °· Exercise only as told by your doctor. Stop exercising if you start having cramps. °· Eat regular, healthy meals. °· Wear a good support bra if your breasts are tender. °· Do not use hot tubs, steam rooms, or saunas. °· Wear your seat belt when driving. °· Avoid raw meat, uncooked cheese, and liter boxes and soil used by cats. °· Take your prenatal vitamins. °· Take 1500-2000 milligrams of calcium daily starting at the 20th week of pregnancy until you deliver your baby. °· Try taking medicine that helps you poop (stool softener) as needed, and if your doctor approves. Eat more fiber by eating fresh fruit, vegetables, and whole grains. Drink enough fluids to keep your pee (urine) clear or pale yellow. °· Take warm water baths (sitz baths) to soothe pain or discomfort caused by hemorrhoids. Use hemorrhoid cream if your doctor approves. °· If you have puffy, bulging veins (varicose veins), wear support hose. Raise (elevate) your feet for 15 minutes, 3-4 times a day. Limit salt in your diet. °· Avoid heavy lifting, wear low heels, and sit up straight. °· Rest with your legs raised if you have leg cramps or low back pain. °· Visit your dentist if you have not gone during your pregnancy. Use a soft toothbrush to brush your teeth. Be gentle when you  floss. °· You can have sex (intercourse) unless your doctor tells you not to. °· Do not travel far distances unless you must. Only do so with your doctor's approval. °· Take prenatal classes. °· Practice driving to the hospital. °· Pack your hospital bag. °· Prepare the baby's room. °· Go to your doctor visits. °GET HELP IF: °· You are not sure if you are in labor or if your water has broken. °· You are dizzy. °· You have mild cramps or pressure in your lower belly (abdominal). °· You have a nagging pain in your belly area. °· You continue to feel sick to your stomach (nauseous), throw up (vomit), or have watery poop (diarrhea). °· You have bad smelling fluid coming from your vagina. °· You have pain with peeing (urination). °GET HELP RIGHT AWAY IF:  °· You have a fever. °· You are leaking fluid from your vagina. °· You are spotting or bleeding from your vagina. °· You have severe belly cramping or pain. °· You lose or gain weight rapidly. °· You have trouble catching your breath and have chest pain. °· You notice sudden or extreme puffiness (swelling) of your face, hands, ankles, feet, or legs. °· You have not felt the baby move in over an hour. °· You have severe headaches that do not go away with medicine. °· You have vision changes. °  °This information is not intended to replace advice given to you by your health care   provider. Make sure you discuss any questions you have with your health care provider. °  °Document Released: 12/19/2009 Document Revised: 10/15/2014 Document Reviewed: 11/25/2012 °[employer] Interactive Patient Education ©2016 [employer] °Fetal Movement Counts °Patient Name: __________________________________________________ Patient Due Date: ____________________ °Performing a fetal movement count is highly recommended in high-risk pregnancies, but it is good for every pregnant woman to do. Your health care provider may ask you to start counting fetal movements at 28 weeks of the pregnancy.  Fetal movements often increase: °· After eating a full meal. °· After physical activity. °· After eating or drinking something sweet or cold. °· At rest. °Pay attention to when you feel the baby is most active. This will help you notice a pattern of your baby's sleep and wake cycles and what factors contribute to an increase in fetal movement. It is important to perform a fetal movement count at the same time each day when your baby is normally most active.  °HOW TO COUNT FETAL MOVEMENTS °· Find a quiet and comfortable area to sit or lie down on your left side. Lying on your left side provides the best blood and oxygen circulation to your baby. °· Write down the day and time on a sheet of paper or in a journal. °· Start counting kicks, flutters, swishes, rolls, or jabs in a 2-hour period. You should feel at least 10 movements within 2 hours. °· If you do not feel 10 movements in 2 hours, wait 2-3 hours and count again. Look for a change in the pattern or not enough counts in 2 hours. °SEEK MEDICAL CARE IF: °· You feel less than 10 counts in 2 hours, tried twice. °· There is no movement in over an hour. °· The pattern is changing or taking longer each day to reach 10 counts in 2 hours. °· You feel the baby is not moving as he or she usually does. °Date: ____________ Movements: ____________ Start time: ____________ Finish time: ____________  °Date: ____________ Movements: ____________ Start time: ____________ Finish time: ____________ °Date: ____________ Movements: ____________ Start time: ____________ Finish time: ____________ °Date: ____________ Movements: ____________ Start time: ____________ Finish time: ____________ °Date: ____________ Movements: ____________ Start time: ____________ Finish time: ____________ °Date: ____________ Movements: ____________ Start time: ____________ Finish time: ____________ °Date: ____________ Movements: ____________ Start time: ____________ Finish time: ____________ °Date:  ____________ Movements: ____________ Start time: ____________ Finish time: ____________  °Date: ____________ Movements: ____________ Start time: ____________ Finish time: ____________ °Date: ____________ Movements: ____________ Start time: ____________ Finish time: ____________ °Date: ____________ Movements: ____________ Start time: ____________ Finish time: ____________ °Date: ____________ Movements: ____________ Start time: ____________ Finish time: ____________ °Date: ____________ Movements: ____________ Start time: ____________ Finish time: ____________ °Date: ____________ Movements: ____________ Start time: ____________ Finish time: ____________ °Date: ____________ Movements: ____________ Start time: ____________ Finish time: ____________  °Date: ____________ Movements: ____________ Start time: ____________ Finish time: ____________ °Date: ____________ Movements: ____________ Start time: ____________ Finish time: ____________ °Date: ____________ Movements: ____________ Start time: ____________ Finish time: ____________ °Date: ____________ Movements: ____________ Start time: ____________ Finish time: ____________ °Date: ____________ Movements: ____________ Start time: ____________ Finish time: ____________ °Date: ____________ Movements: ____________ Start time: ____________ Finish time: ____________ °Date: ____________ Movements: ____________ Start time: ____________ Finish time: ____________  °Date: ____________ Movements: ____________ Start time: ____________ Finish time: ____________ °Date: ____________ Movements: ____________ Start time: ____________ Finish time: ____________ °Date: ____________ Movements: ____________ Start time: ____________ Finish time: ____________ °Date: ____________ Movements: ____________ Start time: ____________ Finish time: ____________ °Date: ____________ Movements: ____________ Start time: ____________   Finish time: ____________ °Date: ____________ Movements: ____________ Start  time: ____________ Finish time: ____________ °Date: ____________ Movements: ____________ Start time: ____________ Finish time: ____________  °Date: ____________ Movements: ____________ Start time: ____________ Finish time: ____________ °Date: ____________ Movements: ____________ Start time: ____________ Finish time: ____________ °Date: ____________ Movements: ____________ Start time: ____________ Finish time: ____________ °Date: ____________ Movements: ____________ Start time: ____________ Finish time: ____________ °Date: ____________ Movements: ____________ Start time: ____________ Finish time: ____________ °Date: ____________ Movements: ____________ Start time: ____________ Finish time: ____________ °Date: ____________ Movements: ____________ Start time: ____________ Finish time: ____________  °Date: ____________ Movements: ____________ Start time: ____________ Finish time: ____________ °Date: ____________ Movements: ____________ Start time: ____________ Finish time: ____________ °Date: ____________ Movements: ____________ Start time: ____________ Finish time: ____________ °Date: ____________ Movements: ____________ Start time: ____________ Finish time: ____________ °Date: ____________ Movements: ____________ Start time: ____________ Finish time: ____________ °Date: ____________ Movements: ____________ Start time: ____________ Finish time: ____________ °Date: ____________ Movements: ____________ Start time: ____________ Finish time: ____________  °Date: ____________ Movements: ____________ Start time: ____________ Finish time: ____________ °Date: ____________ Movements: ____________ Start time: ____________ Finish time: ____________ °Date: ____________ Movements: ____________ Start time: ____________ Finish time: ____________ °Date: ____________ Movements: ____________ Start time: ____________ Finish time: ____________ °Date: ____________ Movements: ____________ Start time: ____________ Finish time:  ____________ °Date: ____________ Movements: ____________ Start time: ____________ Finish time: ____________ °Date: ____________ Movements: ____________ Start time: ____________ Finish time: ____________  °Date: ____________ Movements: ____________ Start time: ____________ Finish time: ____________ °Date: ____________ Movements: ____________ Start time: ____________ Finish time: ____________ °Date: ____________ Movements: ____________ Start time: ____________ Finish time: ____________ °Date: ____________ Movements: ____________ Start time: ____________ Finish time: ____________ °Date: ____________ Movements: ____________ Start time: ____________ Finish time: ____________ °Date: ____________ Movements: ____________ Start time: ____________ Finish time: ____________ °  °This information is not intended to replace advice given to you by your health care provider. Make sure you discuss any questions you have with your health care provider. °  °Document Released: 10/24/2006 Document Revised: 10/15/2014 Document Reviewed: 07/21/2012 °[employer] Interactive Patient Education ©2016 [employer] °Braxton Hicks Contractions °Contractions of the uterus can occur throughout pregnancy. Contractions are not always a sign that you are in labor.  °WHAT ARE BRAXTON HICKS CONTRACTIONS?  °Contractions that occur before labor are called Braxton Hicks contractions, or false labor. Toward the end of pregnancy (32-34 weeks), these contractions can develop more often and may become more forceful. This is not true labor because these contractions do not result in opening (dilatation) and thinning of the cervix. They are sometimes difficult to tell apart from true labor because these contractions can be forceful and people have different pain tolerances. You should not feel embarrassed if you go to the hospital with false labor. Sometimes, the only way to tell if you are in true labor is for your health care provider to look for changes in the  cervix. °If there are no prenatal problems or other health problems associated with the pregnancy, it is completely safe to be sent home with false labor and await the onset of true labor. °HOW CAN YOU TELL THE DIFFERENCE BETWEEN TRUE AND FALSE LABOR? °False Labor °· The contractions of false labor are usually shorter and not as hard as those of true labor.   °· The contractions are usually irregular.   °· The contractions are often felt in the front of the lower abdomen and in the groin.   °· The contractions may go away when you walk around or change positions while lying down.   °· The contractions get weaker   and are shorter lasting as time goes on.   °· The contractions do not usually become progressively stronger, regular, and closer together as with true labor.   °True Labor °· Contractions in true labor last 30-70 seconds, become very regular, usually become more intense, and increase in frequency.   °· The contractions do not go away with walking.   °· The discomfort is usually felt in the top of the uterus and spreads to the lower abdomen and low back.   °· True labor can be determined by your health care provider with an exam. This will show that the cervix is dilating and getting thinner.   °WHAT TO REMEMBER °· Keep up with your usual exercises and follow other instructions given by your health care provider.   °· Take medicines as directed by your health care provider.   °· Keep your regular prenatal appointments.   °· Eat and drink lightly if you think you are going into labor.   °· If Braxton Hicks contractions are making you uncomfortable:   °· Change your position from lying down or resting to walking, or from walking to resting.   °· Sit and rest in a tub of warm water.   °· Drink 2-3 glasses of water. Dehydration may cause these contractions.   °· Do slow and deep breathing several times an hour.   °WHEN SHOULD I SEEK IMMEDIATE MEDICAL CARE? °Seek immediate medical care if: °· Your contractions  become stronger, more regular, and closer together.   °· You have fluid leaking or gushing from your vagina.   °· You have a fever.   °· You pass blood-tinged mucus.   °· You have vaginal bleeding.   °· You have continuous abdominal pain.   °· You have low back pain that you never had before.   °· You feel your baby's head pushing down and causing pelvic pressure.   °· Your baby is not moving as much as it used to.   °  °This information is not intended to replace advice given to you by your health care provider. Make sure you discuss any questions you have with your health care provider. °  °Document Released: 09/24/2005 Document Revised: 09/29/2013 Document Reviewed: 07/06/2013 °[employer] Interactive Patient Education ©2016 [employer] ° °

## 2016-02-10 NOTE — MAU Note (Signed)
Contractions since 1500 today. Denies LOF or bleeding. Was 2cm yesterday

## 2016-02-12 ENCOUNTER — Inpatient Hospital Stay (HOSPITAL_COMMUNITY)
Admission: AD | Admit: 2016-02-12 | Discharge: 2016-02-12 | Disposition: A | Discharge status: Home / Self Care | Payer: BC Managed Care – PPO | Source: Ambulatory Visit | Attending: Obstetrics & Gynecology | Admitting: Obstetrics & Gynecology

## 2016-02-12 ENCOUNTER — Encounter (HOSPITAL_COMMUNITY): Payer: Self-pay | Admitting: Certified Nurse Midwife

## 2016-02-12 DIAGNOSIS — O0993 Supervision of high risk pregnancy, unspecified, third trimester: Secondary | ICD-10-CM

## 2016-02-12 DIAGNOSIS — O358XX0 Maternal care for other (suspected) fetal abnormality and damage, not applicable or unspecified: Secondary | ICD-10-CM

## 2016-02-12 DIAGNOSIS — Z3483 Encounter for supervision of other normal pregnancy, third trimester: Secondary | ICD-10-CM | POA: Diagnosis not present

## 2016-02-12 DIAGNOSIS — O35EXX0 Maternal care for other (suspected) fetal abnormality and damage, fetal genitourinary anomalies, not applicable or unspecified: Secondary | ICD-10-CM

## 2016-02-12 NOTE — MAU Note (Signed)
Pt up to walk for 2 hours per Darlene CNM.

## 2016-02-12 NOTE — Discharge Instructions (Signed)
Fetal Movement Counts °Patient Name: __________________________________________________ Patient Due Date: ____________________ °Performing a fetal movement count is highly recommended in high-risk pregnancies, but it is good for every pregnant woman to do. Your health care provider may ask you to start counting fetal movements at 28 weeks of the pregnancy. Fetal movements often increase: °· After eating a full meal. °· After physical activity. °· After eating or drinking something sweet or cold. °· At rest. °Pay attention to when you feel the baby is most active. This will help you notice a pattern of your baby's sleep and wake cycles and what factors contribute to an increase in fetal movement. It is important to perform a fetal movement count at the same time each day when your baby is normally most active.  °HOW TO COUNT FETAL MOVEMENTS °1. Find a quiet and comfortable area to sit or lie down on your left side. Lying on your left side provides the best blood and oxygen circulation to your baby. °2. Write down the day and time on a sheet of paper or in a journal. °3. Start counting kicks, flutters, swishes, rolls, or jabs in a 2-hour period. You should feel at least 10 movements within 2 hours. °4. If you do not feel 10 movements in 2 hours, wait 2-3 hours and count again. Look for a change in the pattern or not enough counts in 2 hours. °SEEK MEDICAL CARE IF: °· You feel less than 10 counts in 2 hours, tried twice. °· There is no movement in over an hour. °· The pattern is changing or taking longer each day to reach 10 counts in 2 hours. °· You feel the baby is not moving as he or she usually does. °Date: ____________ Movements: ____________ Start time: ____________ Finish time: ____________  °Date: ____________ Movements: ____________ Start time: ____________ Finish time: ____________ °Date: ____________ Movements: ____________ Start time: ____________ Finish time: ____________ °Date: ____________ Movements:  ____________ Start time: ____________ Finish time: ____________ °Date: ____________ Movements: ____________ Start time: ____________ Finish time: ____________ °Date: ____________ Movements: ____________ Start time: ____________ Finish time: ____________ °Date: ____________ Movements: ____________ Start time: ____________ Finish time: ____________ °Date: ____________ Movements: ____________ Start time: ____________ Finish time: ____________  °Date: ____________ Movements: ____________ Start time: ____________ Finish time: ____________ °Date: ____________ Movements: ____________ Start time: ____________ Finish time: ____________ °Date: ____________ Movements: ____________ Start time: ____________ Finish time: ____________ °Date: ____________ Movements: ____________ Start time: ____________ Finish time: ____________ °Date: ____________ Movements: ____________ Start time: ____________ Finish time: ____________ °Date: ____________ Movements: ____________ Start time: ____________ Finish time: ____________ °Date: ____________ Movements: ____________ Start time: ____________ Finish time: ____________  °Date: ____________ Movements: ____________ Start time: ____________ Finish time: ____________ °Date: ____________ Movements: ____________ Start time: ____________ Finish time: ____________ °Date: ____________ Movements: ____________ Start time: ____________ Finish time: ____________ °Date: ____________ Movements: ____________ Start time: ____________ Finish time: ____________ °Date: ____________ Movements: ____________ Start time: ____________ Finish time: ____________ °Date: ____________ Movements: ____________ Start time: ____________ Finish time: ____________ °Date: ____________ Movements: ____________ Start time: ____________ Finish time: ____________  °Date: ____________ Movements: ____________ Start time: ____________ Finish time: ____________ °Date: ____________ Movements: ____________ Start time: ____________ Finish  time: ____________ °Date: ____________ Movements: ____________ Start time: ____________ Finish time: ____________ °Date: ____________ Movements: ____________ Start time: ____________ Finish time: ____________ °Date: ____________ Movements: ____________ Start time: ____________ Finish time: ____________ °Date: ____________ Movements: ____________ Start time: ____________ Finish time: ____________ °Date: ____________ Movements: ____________ Start time: ____________ Finish time: ____________  °Date: ____________ Movements: ____________ Start time: ____________ Finish   time: ____________ °Date: ____________ Movements: ____________ Start time: ____________ Finish time: ____________ °Date: ____________ Movements: ____________ Start time: ____________ Finish time: ____________ °Date: ____________ Movements: ____________ Start time: ____________ Finish time: ____________ °Date: ____________ Movements: ____________ Start time: ____________ Finish time: ____________ °Date: ____________ Movements: ____________ Start time: ____________ Finish time: ____________ °Date: ____________ Movements: ____________ Start time: ____________ Finish time: ____________  °Date: ____________ Movements: ____________ Start time: ____________ Finish time: ____________ °Date: ____________ Movements: ____________ Start time: ____________ Finish time: ____________ °Date: ____________ Movements: ____________ Start time: ____________ Finish time: ____________ °Date: ____________ Movements: ____________ Start time: ____________ Finish time: ____________ °Date: ____________ Movements: ____________ Start time: ____________ Finish time: ____________ °Date: ____________ Movements: ____________ Start time: ____________ Finish time: ____________ °Date: ____________ Movements: ____________ Start time: ____________ Finish time: ____________  °Date: ____________ Movements: ____________ Start time: ____________ Finish time: ____________ °Date: ____________  Movements: ____________ Start time: ____________ Finish time: ____________ °Date: ____________ Movements: ____________ Start time: ____________ Finish time: ____________ °Date: ____________ Movements: ____________ Start time: ____________ Finish time: ____________ °Date: ____________ Movements: ____________ Start time: ____________ Finish time: ____________ °Date: ____________ Movements: ____________ Start time: ____________ Finish time: ____________ °Date: ____________ Movements: ____________ Start time: ____________ Finish time: ____________  °Date: ____________ Movements: ____________ Start time: ____________ Finish time: ____________ °Date: ____________ Movements: ____________ Start time: ____________ Finish time: ____________ °Date: ____________ Movements: ____________ Start time: ____________ Finish time: ____________ °Date: ____________ Movements: ____________ Start time: ____________ Finish time: ____________ °Date: ____________ Movements: ____________ Start time: ____________ Finish time: ____________ °Date: ____________ Movements: ____________ Start time: ____________ Finish time: ____________ °  °This information is not intended to replace advice given to you by your health care provider. Make sure you discuss any questions you have with your health care provider. °  °Document Released: 10/24/2006 Document Revised: 10/15/2014 Document Reviewed: 07/21/2012 °[employer]vier Interactive Patient Education ©2016 [employer] °Vaginal Delivery °During delivery, your health care provider will help you give birth to your baby. During a vaginal delivery, you will work to push the baby out of your vagina. However, before you can push your baby out, a few things need to happen. The opening of your uterus (cervix) has to soften, thin out, and open up (dilate) all the way to 10 cm. Also, your baby has to move down from the uterus into your vagina.  °SIGNS OF LABOR  °Your health care provider will first need to make sure you  are in labor. Signs of labor include:  °· Passing what is called the mucous plug before labor begins. This is a small amount of blood-stained mucus. °· Having regular, painful uterine contractions.   °· The time between contractions gets shorter.   °· The discomfort and pain gradually get more intense. °· Contraction pains get worse when walking and do not go away when resting.   °· Your cervix becomes thinner (effacement) and dilates. °BEFORE THE DELIVERY °Once you are in labor and admitted into the hospital or care center, your health care provider may do the following:  °5. Perform a complete physical exam. °6. Review any complications related to pregnancy or labor.  °7. Check your blood pressure, pulse, temperature, and heart rate (vital signs).   °8. Determine if, and when, the rupture of amniotic membranes occurred. °9. Do a vaginal exam (using a sterile glove and lubricant) to determine:   °1. The position (presentation) of the baby. Is the baby's head presenting first (vertex) in the birth canal (vagina), or are the feet or buttocks first (breech)?   °2. The level (station) of the baby's head within   the birth canal.   °3. The effacement and dilatation of the cervix.   °10. An electronic fetal monitor is usually placed on your abdomen when you first arrive. This is used to monitor your contractions and the baby's heart rate. °1. When the monitor is on your abdomen (external fetal monitor), it can only pick up the frequency and length of your contractions. It cannot tell the strength of your contractions. °2. If it becomes necessary for your health care provider to know exactly how strong your contractions are or to see exactly what the baby's heart rate is doing, an internal monitor may be inserted into your vagina and uterus. Your health care provider will discuss the benefits and risks of using an internal monitor and obtain your permission before inserting the device. °3. Continuous fetal monitoring may be  needed if you have an epidural, are receiving certain medicines (such as oxytocin), or have pregnancy or labor complications. °11. An IV access tube may be placed into a vein in your arm to deliver fluids and medicines if necessary. °THREE STAGES OF LABOR AND DELIVERY °Normal labor and delivery is divided into three stages. °First Stage °This stage starts when you begin to contract regularly and your cervix begins to efface and dilate. It ends when your cervix is completely open (fully dilated). The first stage is the longest stage of labor and can last from 3 hours to 15 hours.  °Several methods are available to help with labor pain. You and your health care provider will decide which option is best for you. Options include:  °· Opioid medicines. These are strong pain medicines that you can get through your IV tube or as a shot into your muscle. These medicines lessen pain but do not make it go away completely.  °· Epidural. A medicine is given through a thin tube that is inserted in your back. The medicine numbs the lower part of your body and prevents any pain in that area. °· Paracervical pain medicine. This is an injection of an anesthetic on each side of your cervix.   °· You may request natural childbirth, which does not involve the use of pain medicines or an epidural during labor and delivery. Instead, you will use other things, such as breathing exercises, to help cope with the pain. °Second Stage °The second stage of labor begins when your cervix is fully dilated at 10 cm. It continues until you push your baby down through the birth canal and the baby is born. This stage can take only minutes or several hours. °· The location of your baby's head as it moves through the birth canal is reported as a number called a station. If the baby's head has not started its descent, the station is described as being at minus 3 (-3). When your baby's head is at the zero station, it is at the middle of the birth canal  and is engaged in the pelvis. The station of your baby helps indicate the progress of the second stage of labor. °· When your baby is born, your health care provider may hold the baby with his or her head lowered to prevent amniotic fluid, mucus, and blood from getting into the baby's lungs. The baby's mouth and nose may be suctioned with a small bulb syringe to remove any additional fluid. °· Your health care provider may then place the baby on your stomach. It is important to keep the baby from getting cold. To do this, the health care provider will dry   the baby off, place the baby directly on your skin (with no blankets between you and the baby), and cover the baby with warm, dry blankets.   °· The umbilical cord is cut. °Third Stage °During the third stage of labor, your health care provider will deliver the placenta (afterbirth) and make sure your bleeding is under control. The delivery of the placenta usually takes about 5 minutes but can take up to 30 minutes. After the placenta is delivered, a medicine may be given either by IV or injection to help contract the uterus and control bleeding. If you are planning to breastfeed, you can try to do so now. °After you deliver the placenta, your uterus should contract and get very firm. If your uterus does not remain firm, your health care provider will massage it. This is important because the contraction of the uterus helps cut off bleeding at the site where the placenta was attached to your uterus. If your uterus does not contract properly and stay firm, you may continue to bleed heavily. If there is a lot of bleeding, medicines may be given to contract the uterus and stop the bleeding.  °  °This information is not intended to replace advice given to you by your health care provider. Make sure you discuss any questions you have with your health care provider. °  °Document Released: 07/03/2008 Document Revised: 10/15/2014 Document Reviewed: 05/21/2012 °Elsevier  Interactive Patient Education ©2016 [employer] ° °

## 2016-02-12 NOTE — MAU Note (Signed)
Darlene CNM does not request repeat EFM and TOCO tracing after pt returns to room. Pt given discharge instructions and pt verbalizes understanding.

## 2016-02-12 NOTE — MAU Note (Signed)
Pt states she has been having ctxs 4-5 minutes apart since noon. Pt denies LOF, vaginal bleeding. Pt states +FM.

## 2016-02-16 ENCOUNTER — Inpatient Hospital Stay (HOSPITAL_COMMUNITY)
Admission: AD | Admit: 2016-02-16 | Discharge: 2016-02-17 | DRG: 780 | Disposition: A | Discharge status: Home / Self Care | Payer: BC Managed Care – PPO | Source: Ambulatory Visit | Attending: Obstetrics and Gynecology | Admitting: Obstetrics and Gynecology

## 2016-02-16 ENCOUNTER — Encounter (HOSPITAL_COMMUNITY): Payer: Self-pay

## 2016-02-16 ENCOUNTER — Ambulatory Visit (INDEPENDENT_AMBULATORY_CARE_PROVIDER_SITE_OTHER): Payer: BC Managed Care – PPO | Admitting: Obstetrics & Gynecology

## 2016-02-16 VITALS — BP 124/70 | HR 108 | Wt 292.3 lb

## 2016-02-16 DIAGNOSIS — Z833 Family history of diabetes mellitus: Secondary | ICD-10-CM

## 2016-02-16 DIAGNOSIS — O99820 Streptococcus B carrier state complicating pregnancy: Secondary | ICD-10-CM | POA: Diagnosis present

## 2016-02-16 DIAGNOSIS — IMO0001 Reserved for inherently not codable concepts without codable children: Secondary | ICD-10-CM

## 2016-02-16 DIAGNOSIS — Z3A40 40 weeks gestation of pregnancy: Secondary | ICD-10-CM

## 2016-02-16 DIAGNOSIS — Z8249 Family history of ischemic heart disease and other diseases of the circulatory system: Secondary | ICD-10-CM

## 2016-02-16 DIAGNOSIS — O471 False labor at or after 37 completed weeks of gestation: Principal | ICD-10-CM | POA: Diagnosis present

## 2016-02-16 DIAGNOSIS — O0993 Supervision of high risk pregnancy, unspecified, third trimester: Secondary | ICD-10-CM

## 2016-02-16 DIAGNOSIS — Z825 Family history of asthma and other chronic lower respiratory diseases: Secondary | ICD-10-CM

## 2016-02-16 LAB — POCT URINALYSIS DIP (DEVICE)
BILIRUBIN URINE: NEGATIVE
Glucose, UA: NEGATIVE mg/dL
HGB URINE DIPSTICK: NEGATIVE
Leukocytes, UA: NEGATIVE
NITRITE: NEGATIVE
PH: 7 (ref 5.0–8.0)
PROTEIN: NEGATIVE mg/dL
Specific Gravity, Urine: 1.02 (ref 1.005–1.030)
Urobilinogen, UA: 0.2 mg/dL (ref 0.0–1.0)

## 2016-02-16 NOTE — Progress Notes (Signed)
Subjective:  Cathy Webster is a 25 y.o. [phone] [id number] at 134w0d being seen today for ongoing prenatal care.  She is currently monitored for the following issues for this high-risk pregnancy and has Previous cesarean delivery, delivered, with or without mention of antepartum condition; Short interval between pregnancies complicating pregnancy, antepartum; Abnormal Pap smear of cervix; History of cesarean section, low transverse; Supervision of high risk pregnancy, antepartum; and Kidney abnormality of fetus on prenatal ultrasound on her problem list.  Patient reports occasional contractions.  Contractions: Irregular. Vag. Bleeding: None.  Movement: Present. Denies leaking of fluid.   The following portions of the patient's history were reviewed and updated as appropriate: allergies, current medications, past family history, past medical history, past social history, past surgical history and problem list. Problem list updated.  Objective:   Filed Vitals:   02/16/16 1142  BP: 124/70  Pulse: 108  Weight: 292 lb 4.8 oz (132.586 kg)    Fetal Status: Fetal Heart Rate (bpm): 146   Movement: Present     General:  Alert, oriented and cooperative. Patient is in no acute distress.  Skin: Skin is warm and dry. No rash noted.   Cardiovascular: Normal heart rate noted  Respiratory: Normal respiratory effort, no problems with respiration noted  Abdomen: Soft, gravid, appropriate for gestational age. Pain/Pressure: Present     Pelvic: Vag. Bleeding: None     Cervical exam performed        Extremities: Normal range of motion.  Edema: Mild pitting, slight indentation  Mental Status: Normal mood and affect. Normal behavior. Normal judgment and thought content.   Urinalysis: Urine Protein: Negative Urine Glucose: Negative  Assessment and Plan:  Pregnancy: [id number] at 554w0d  There are no diagnoses linked to this encounter. Term labor symptoms and general obstetric precautions including but not limited to  vaginal bleeding, contractions, leaking of fluid and fetal movement were reviewed in detail with the patient. Please refer to After Visit Summary for other counseling recommendations.  Return in about 4 days (around 02/20/2016).    Adam PhenixJames G Gerrett Loman, MD

## 2016-02-16 NOTE — Patient Instructions (Signed)
Vaginal Delivery °During delivery, your health care provider will help you give birth to your baby. During a vaginal delivery, you will work to push the baby out of your vagina. However, before you can push your baby out, a few things need to happen. The opening of your uterus (cervix) has to soften, thin out, and open up (dilate) all the way to 10 cm. Also, your baby has to move down from the uterus into your vagina.  °SIGNS OF LABOR  °Your health care provider will first need to make sure you are in labor. Signs of labor include:  °· Passing what is called the mucous plug before labor begins. This is a small amount of blood-stained mucus. °· Having regular, painful uterine contractions.   °· The time between contractions gets shorter.   °· The discomfort and pain gradually get more intense. °· Contraction pains get worse when walking and do not go away when resting.   °· Your cervix becomes thinner (effacement) and dilates. °BEFORE THE DELIVERY °Once you are in labor and admitted into the hospital or care center, your health care provider may do the following:  °· Perform a complete physical exam. °· Review any complications related to pregnancy or labor.  °· Check your blood pressure, pulse, temperature, and heart rate (vital signs).   °· Determine if, and when, the rupture of amniotic membranes occurred. °· Do a vaginal exam (using a sterile glove and lubricant) to determine:   °¨ The position (presentation) of the baby. Is the baby's head presenting first (vertex) in the birth canal (vagina), or are the feet or buttocks first (breech)?   °¨ The level (station) of the baby's head within the birth canal.   °¨ The effacement and dilatation of the cervix.   °· An electronic fetal monitor is usually placed on your abdomen when you first arrive. This is used to monitor your contractions and the baby's heart rate. °¨ When the monitor is on your abdomen (external fetal monitor), it can only pick up the frequency and  length of your contractions. It cannot tell the strength of your contractions. °¨ If it becomes necessary for your health care provider to know exactly how strong your contractions are or to see exactly what the baby's heart rate is doing, an internal monitor may be inserted into your vagina and uterus. Your health care provider will discuss the benefits and risks of using an internal monitor and obtain your permission before inserting the device. °¨ Continuous fetal monitoring may be needed if you have an epidural, are receiving certain medicines (such as oxytocin), or have pregnancy or labor complications. °· An IV access tube may be placed into a vein in your arm to deliver fluids and medicines if necessary. °THREE STAGES OF LABOR AND DELIVERY °Normal labor and delivery is divided into three stages. °First Stage °This stage starts when you begin to contract regularly and your cervix begins to efface and dilate. It ends when your cervix is completely open (fully dilated). The first stage is the longest stage of labor and can last from 3 hours to 15 hours.  °Several methods are available to help with labor pain. You and your health care provider will decide which option is best for you. Options include:  °· Opioid medicines. These are strong pain medicines that you can get through your IV tube or as a shot into your muscle. These medicines lessen pain but do not make it go away completely.  °· Epidural. A medicine is given through a thin tube that   is inserted in your back. The medicine numbs the lower part of your body and prevents any pain in that area. °· Paracervical pain medicine. This is an injection of an anesthetic on each side of your cervix.   °· You may request natural childbirth, which does not involve the use of pain medicines or an epidural during labor and delivery. Instead, you will use other things, such as breathing exercises, to help cope with the pain. °Second Stage °The second stage of labor  begins when your cervix is fully dilated at 10 cm. It continues until you push your baby down through the birth canal and the baby is born. This stage can take only minutes or several hours. °· The location of your baby's head as it moves through the birth canal is reported as a number called a station. If the baby's head has not started its descent, the station is described as being at minus 3 (-3). When your baby's head is at the zero station, it is at the middle of the birth canal and is engaged in the pelvis. The station of your baby helps indicate the progress of the second stage of labor. °· When your baby is born, your health care provider may hold the baby with his or her head lowered to prevent amniotic fluid, mucus, and blood from getting into the baby's lungs. The baby's mouth and nose may be suctioned with a small bulb syringe to remove any additional fluid. °· Your health care provider may then place the baby on your stomach. It is important to keep the baby from getting cold. To do this, the health care provider will dry the baby off, place the baby directly on your skin (with no blankets between you and the baby), and cover the baby with warm, dry blankets.   °· The umbilical cord is cut. °Third Stage °During the third stage of labor, your health care provider will deliver the placenta (afterbirth) and make sure your bleeding is under control. The delivery of the placenta usually takes about 5 minutes but can take up to 30 minutes. After the placenta is delivered, a medicine may be given either by IV or injection to help contract the uterus and control bleeding. If you are planning to breastfeed, you can try to do so now. °After you deliver the placenta, your uterus should contract and get very firm. If your uterus does not remain firm, your health care provider will massage it. This is important because the contraction of the uterus helps cut off bleeding at the site where the placenta was attached  to your uterus. If your uterus does not contract properly and stay firm, you may continue to bleed heavily. If there is a lot of bleeding, medicines may be given to contract the uterus and stop the bleeding.  °  °This information is not intended to replace advice given to you by your health care provider. Make sure you discuss any questions you have with your health care provider. °  °Document Released: 07/03/2008 Document Revised: 10/15/2014 Document Reviewed: 05/21/2012 °[employer]vier Interactive Patient Education ©2016 [employer] ° °

## 2016-02-16 NOTE — MAU Note (Signed)
Notified provider that patient is here for a labor eval. Patient is 3/thick/-3. Provider said to watch patient for an hour.

## 2016-02-17 DIAGNOSIS — Z8249 Family history of ischemic heart disease and other diseases of the circulatory system: Secondary | ICD-10-CM | POA: Diagnosis not present

## 2016-02-17 DIAGNOSIS — IMO0001 Reserved for inherently not codable concepts without codable children: Secondary | ICD-10-CM

## 2016-02-17 DIAGNOSIS — O99820 Streptococcus B carrier state complicating pregnancy: Secondary | ICD-10-CM | POA: Diagnosis present

## 2016-02-17 DIAGNOSIS — Z825 Family history of asthma and other chronic lower respiratory diseases: Secondary | ICD-10-CM | POA: Diagnosis not present

## 2016-02-17 DIAGNOSIS — Z3A40 40 weeks gestation of pregnancy: Secondary | ICD-10-CM | POA: Diagnosis not present

## 2016-02-17 DIAGNOSIS — O471 False labor at or after 37 completed weeks of gestation: Secondary | ICD-10-CM | POA: Diagnosis present

## 2016-02-17 DIAGNOSIS — Z833 Family history of diabetes mellitus: Secondary | ICD-10-CM | POA: Diagnosis not present

## 2016-02-17 LAB — CBC
HEMATOCRIT: 35.7 % — AB (ref 36.0–46.0)
HEMOGLOBIN: 12 g/dL (ref 12.0–15.0)
MCH: 27 pg (ref 26.0–34.0)
MCHC: 33.6 g/dL (ref 30.0–36.0)
MCV: 80.2 fL (ref 78.0–100.0)
Platelets: 150 10*3/uL (ref 150–400)
RBC: 4.45 MIL/uL (ref 3.87–5.11)
RDW: 14.8 % (ref 11.5–15.5)
WBC: 9.4 10*3/uL (ref 4.0–10.5)

## 2016-02-17 LAB — TYPE AND SCREEN
ABO/RH(D): O POS
ANTIBODY SCREEN: NEGATIVE

## 2016-02-17 LAB — RPR: RPR Ser Ql: NONREACTIVE

## 2016-02-17 MED ORDER — OXYTOCIN 40 UNITS IN LACTATED RINGERS INFUSION - SIMPLE MED: 2.5000 [IU]/h | INTRAVENOUS | Status: DC

## 2016-02-17 MED ORDER — CITRIC ACID-SODIUM CITRATE 334-500 MG/5ML PO SOLN: 30.0000 mL | ORAL | Status: DC | PRN

## 2016-02-17 MED ORDER — OXYTOCIN BOLUS FROM INFUSION: 500.0000 mL | INTRAVENOUS | Status: DC

## 2016-02-17 MED ORDER — ACETAMINOPHEN 325 MG PO TABS: 650.0000 mg | ORAL_TABLET | ORAL | Status: DC | PRN

## 2016-02-17 MED ORDER — FENTANYL CITRATE (PF) 100 MCG/2ML IJ SOLN: 50.0000 ug | INTRAMUSCULAR | Status: DC | PRN

## 2016-02-17 MED ORDER — LIDOCAINE HCL (PF) 1 % IJ SOLN: 30.0000 mL | INTRAMUSCULAR | Status: DC | PRN

## 2016-02-17 MED ORDER — PENICILLIN G POTASSIUM 5000000 UNITS IJ SOLR
5.0000 10*6.[IU] | Freq: Once | INTRAVENOUS | Status: DC
  Filled 2016-02-17: qty 5

## 2016-02-17 MED ORDER — LACTATED RINGERS IV SOLN: 500.0000 mL | INTRAVENOUS | Status: DC | PRN

## 2016-02-17 MED ORDER — ZOLPIDEM TARTRATE 5 MG PO TABS: 5.0000 mg | ORAL_TABLET | Freq: Every evening | ORAL | Status: DC | PRN

## 2016-02-17 MED ORDER — OXYCODONE-ACETAMINOPHEN 5-325 MG PO TABS: 1.0000 | ORAL_TABLET | ORAL | Status: DC | PRN

## 2016-02-17 MED ORDER — FLEET ENEMA 7-19 GM/118ML RE ENEM: 1.0000 | ENEMA | RECTAL | Status: DC | PRN

## 2016-02-17 MED ORDER — ONDANSETRON HCL 4 MG/2ML IJ SOLN: 4.0000 mg | Freq: Four times a day (QID) | INTRAMUSCULAR | Status: DC | PRN

## 2016-02-17 MED ORDER — OXYCODONE-ACETAMINOPHEN 5-325 MG PO TABS: 2.0000 | ORAL_TABLET | ORAL | Status: DC | PRN

## 2016-02-17 MED ORDER — PENICILLIN G POTASSIUM 5000000 UNITS IJ SOLR
2.5000 10*6.[IU] | INTRAMUSCULAR | Status: DC
  Filled 2016-02-17 (×2): qty 2.5

## 2016-02-17 MED ORDER — LACTATED RINGERS IV SOLN: INTRAVENOUS | Status: DC

## 2016-02-17 NOTE — H&P (Signed)
Annessa Satre is a 25 y.o. female [phone] with IUP at [redacted]w[redacted]d presenting for contractions. Pt states she has been having irregular, every 1-3 minutes contractions, associated with scant staining vaginal bleeding for 4-5 hours..  Membranes are intact, with active fetal movement.   [facility] at [facility]  Prenatal History/Complications: CS w/1st baby for failed IOL after PROM 2 successful VBACS  Past Medical History: Past Medical History  Diagnosis Date  . Abnormal Pap smear 2008    Past Surgical History: Past Surgical History  Procedure Laterality Date  . Cesarean section    . Tonsillectomy      Obstetrical History: OB History    Gravida Para Term Preterm AB TAB SAB Ectopic Multiple Living   Social History: Social History   Social History  . Marital Status: Single    Spouse Name: N/A  . Number of Children: N/A  . Years of Education: N/A   Social History Main Topics  . Smoking status: Never Smoker   . Smokeless tobacco: Never Used  . Alcohol Use: No  . Drug Use: No  . Sexual Activity: Yes    Birth Control/ Protection: Pill   Other Topics Concern  . None   Social History Narrative    Family History: Family History  Problem Relation Age of Onset  . Diabetes Mother   . Hypertension Mother   . Asthma Brother     Allergies: No Known Allergies  Prescriptions prior to admission  Medication Sig Dispense Refill Last Dose  . acetaminophen (TYLENOL) 500 MG tablet Take 1,000 mg by mouth every 6 (six) hours as needed for mild pain or headache.    Past Week at Unknown time  . oxyCODONE-acetaminophen (PERCOCET/ROXICET) 5-325 MG tablet Take 1-2 tablets by mouth every 6 (six) hours as needed for severe pain. (Patient not taking: Reported on 02/09/2016) 15 tablet 0 Not Taking  . Prenatal Multivit-Min-Fe-FA (PRENATAL VITAMINS) 0.8 MG tablet Take 1 tablet by mouth daily. 30 tablet 12 Past Month at Unknown time     Prenatal Transfer Tool  Maternal Diabetes:  No Genetic Screening: Normal Maternal Ultrasounds/Referrals: Normal Fetal Ultrasounds or other Referrals:  Other:  Maternal Substance Abuse:  No Significant Maternal Medications:  None Significant Maternal Lab Results: Lab values include: Group B Strep positive     Review of Systems   Constitutional: Negative for fever and chills Eyes: Negative for visual disturbances Respiratory: Negative for shortness of breath, dyspnea Cardiovascular: Negative for chest pain or palpitations  Gastrointestinal: Negative for vomiting, diarrhea and constipation.  POSITIVE for abdominal pain (contractions) Genitourinary: Negative for dysuria and urgency Musculoskeletal: Negative for back pain, joint pain, myalgias  Neurological: Negative for dizziness and headaches      Last menstrual period 05/12/2015, not currently breastfeeding. General appearance: alert, cooperative and mild distress Lungs: clear to auscultation bilaterally Heart: regular rate and rhythm Abdomen: soft, non-tender; bowel sounds normal Pelvic: 3/thick/-3 (change from 1/thick-3 earlier today) Extremities: Homans sign is negative, no sign of DVT DTR's 2+ Presentation: cephalic Fetal monitoring  Baseline: 140 bpm, Variability: Good {> 6 bpm), Accelerations: Reactive and Decelerations: Absent Uterine activity  1.5-3 minutes  Dilation: 3 Effacement (%): Thick Station: -3 Exam by:: Sharen Hint RN    Prenatal labs: ABO, Rh: O/POS/-- (11/02 1343) Antibody: NEG (11/02 1343) Rubella: !Error! RPR: NON REAC (02/28 1216)  HBsAg: NEGATIVE (11/02 1343)  HIV: NONREACTIVE (02/28 1216)  GBS: Positive (04/12 0000)  Results for orders placed or performed in visit on 02/16/16 (from the past 24 hour(s))  POCT urinalysis dip (device)   Collection Time: 02/16/16 10:49 AM  Result Value Ref Range   Glucose, UA NEGATIVE NEGATIVE mg/dL   Bilirubin Urine NEGATIVE NEGATIVE   Ketones, ur TRACE (A) NEGATIVE mg/dL   Specific Gravity,  Urine 1.020 1.005 - 1.030   Hgb urine dipstick NEGATIVE NEGATIVE   pH 7.0 5.0 - 8.0   Protein, ur NEGATIVE NEGATIVE mg/dL   Urobilinogen, UA 0.2 0.0 - 1.0 mg/dL   Nitrite NEGATIVE NEGATIVE   Leukocytes, UA NEGATIVE NEGATIVE  VBAC consent signed 01/26/16 Clinic  WOC Prenatal Labs  Dating  LMP, consistent with 13 wk ultrasound Blood type: O/POS/-- (11/02 1343)   Genetic Screen 1 Screen: NT nml, poor visualization of nasal bone    AFP:     Quad:     NIPS: Antibody:NEG (11/02 1343)  Anatomic US  Normal except Bilateral UTD 4mm seen  >>  F/U showed resolution Rubella: 6.08 (11/02 1343)  GTT Early:  73             Third trimester: 76 RPR: NON REAC (02/28 1216)   Flu vaccine  Declines HBsAg: NEGATIVE (11/02 1343)   TDaP vaccine  Declines                                         Rhogam: N/A HIV: NONREACTIVE (02/28 1216)   Baby Food  Breast                                             GBS:  Positive (For PCN allergy, check sensitivities)  Contraception  Considering IUD, liked pills but forgot some Pap:  Negative  Circumcision N/a, female   Pediatrician  [facility]   Support Person  Darius Blue (FOB)     Assessment: Judye BosKeyeria Damron is a 25 y.o. [phone] with an IUP at 5738w1d presenting for early labor,/TOLAC  Plan: #Labor: expectant management; [place], is very uncomfortable, frequent ctx.  WIll admit d/t cx change although cx still thick,   #Pain:  Per request #FWB Cat 1 #ID: GBS: PCN    CRESENZO-DISHMAN,Eastyn Dattilo 02/17/2016, 1:21 AM

## 2016-02-17 NOTE — Progress Notes (Signed)
Contractions have nearly ceased.  No further cervical change.  FHR 140, moderate variability, + accels, no decels.  Rare contractions.  Pt wants to go home, hoping to avoid IOL.  Labor precautions and ambien rx given

## 2016-02-17 NOTE — Discharge Instructions (Signed)

## 2016-02-17 NOTE — Discharge Summary (Signed)
Obstetric Discharge Summary Reason for Admission: onset of labor Prenatal Procedures: ultrasound Intrapartum Procedures: GBS prophylaxis Postpartum Procedures: n/a:  pt stopped laboring and was dc Complications-Operative and Postpartum: na HEMOGLOBIN  Date Value Ref Range Status  02/17/2016 12.0 12.0 - 15.0 g/dL Final   HCT  Date Value Ref Range Status  02/17/2016 35.7* 36.0 - 46.0 % Final    Physical Exam:  General: alert, cooperative and no distress HRR&R LCTAB FHR:  Cat 1 Uterus:  Rare and mild contractions Discharge Diagnoses: false labor  Discharge Information: Date: 02/17/2016 Activity: unrestricted Diet: routine Medications: ambien Condition: stable Instructions: refer to practice specific booklet Discharge to: home Follow-up Information    Follow up with [facility].   Specialty:  Obstetrics and Gynecology   Why:  as scheduled   Contact information:   [place] [phone]       [place] 02/17/2016, 6:07 AM

## 2016-02-20 ENCOUNTER — Telehealth (HOSPITAL_COMMUNITY): Payer: Self-pay | Admitting: *Deleted

## 2016-02-20 ENCOUNTER — Encounter (HOSPITAL_COMMUNITY): Payer: Self-pay | Admitting: *Deleted

## 2016-02-20 NOTE — Telephone Encounter (Signed)
Preadmission screen  

## 2016-02-21 ENCOUNTER — Inpatient Hospital Stay (HOSPITAL_COMMUNITY)
Admission: AD | Admit: 2016-02-21 | Discharge: 2016-02-23 | DRG: 775 | Disposition: A | Discharge status: Home / Self Care | Payer: BC Managed Care – PPO | Source: Ambulatory Visit | Attending: Obstetrics & Gynecology | Admitting: Obstetrics & Gynecology

## 2016-02-21 ENCOUNTER — Encounter (HOSPITAL_COMMUNITY): Payer: Self-pay

## 2016-02-21 DIAGNOSIS — Z880 Allergy status to penicillin: Secondary | ICD-10-CM

## 2016-02-21 DIAGNOSIS — O134 Gestational [pregnancy-induced] hypertension without significant proteinuria, complicating childbirth: Principal | ICD-10-CM | POA: Diagnosis present

## 2016-02-21 DIAGNOSIS — O34219 Maternal care for unspecified type scar from previous cesarean delivery: Secondary | ICD-10-CM | POA: Diagnosis not present

## 2016-02-21 DIAGNOSIS — O99824 Streptococcus B carrier state complicating childbirth: Secondary | ICD-10-CM | POA: Diagnosis present

## 2016-02-21 DIAGNOSIS — Z6841 Body Mass Index (BMI) 40.0 and over, adult: Secondary | ICD-10-CM

## 2016-02-21 DIAGNOSIS — Z825 Family history of asthma and other chronic lower respiratory diseases: Secondary | ICD-10-CM | POA: Diagnosis not present

## 2016-02-21 DIAGNOSIS — O34211 Maternal care for low transverse scar from previous cesarean delivery: Secondary | ICD-10-CM | POA: Diagnosis present

## 2016-02-21 DIAGNOSIS — O358XX0 Maternal care for other (suspected) fetal abnormality and damage, not applicable or unspecified: Secondary | ICD-10-CM

## 2016-02-21 DIAGNOSIS — Z8249 Family history of ischemic heart disease and other diseases of the circulatory system: Secondary | ICD-10-CM | POA: Diagnosis not present

## 2016-02-21 DIAGNOSIS — Z3A40 40 weeks gestation of pregnancy: Secondary | ICD-10-CM

## 2016-02-21 DIAGNOSIS — O99214 Obesity complicating childbirth: Secondary | ICD-10-CM | POA: Diagnosis present

## 2016-02-21 DIAGNOSIS — Z833 Family history of diabetes mellitus: Secondary | ICD-10-CM | POA: Diagnosis not present

## 2016-02-21 DIAGNOSIS — O35EXX0 Maternal care for other (suspected) fetal abnormality and damage, fetal genitourinary anomalies, not applicable or unspecified: Secondary | ICD-10-CM

## 2016-02-21 DIAGNOSIS — O133 Gestational [pregnancy-induced] hypertension without significant proteinuria, third trimester: Secondary | ICD-10-CM

## 2016-02-21 DIAGNOSIS — R030 Elevated blood-pressure reading, without diagnosis of hypertension: Secondary | ICD-10-CM | POA: Diagnosis present

## 2016-02-21 LAB — URINE MICROSCOPIC-ADD ON

## 2016-02-21 LAB — COMPREHENSIVE METABOLIC PANEL
ALBUMIN: 3 g/dL — AB (ref 3.5–5.0)
ALK PHOS: 80 U/L (ref 38–126)
ALT: 19 U/L (ref 14–54)
AST: 22 U/L (ref 15–41)
Anion gap: 7 (ref 5–15)
BILIRUBIN TOTAL: 0.2 mg/dL — AB (ref 0.3–1.2)
CALCIUM: 8.6 mg/dL — AB (ref 8.9–10.3)
CO2: 24 mmol/L (ref 22–32)
Chloride: 106 mmol/L (ref 101–111)
Creatinine, Ser: 0.48 mg/dL (ref 0.44–1.00)
GFR calc Af Amer: >60 mL/min (ref >60)
GFR calc non Af Amer: >60 mL/min (ref >60)
GLUCOSE: 78 mg/dL (ref 65–99)
Potassium: 3.4 mmol/L — ABNORMAL LOW (ref 3.5–5.1)
SODIUM: 137 mmol/L (ref 135–145)
TOTAL PROTEIN: 7.1 g/dL (ref 6.5–8.1)

## 2016-02-21 LAB — URINALYSIS, ROUTINE W REFLEX MICROSCOPIC
Bilirubin Urine: NEGATIVE
GLUCOSE, UA: NEGATIVE mg/dL
Ketones, ur: 15 mg/dL — AB
Nitrite: NEGATIVE
PROTEIN: NEGATIVE mg/dL
SPECIFIC GRAVITY, URINE: 1.02 (ref 1.005–1.030)
pH: 6.5 (ref 5.0–8.0)

## 2016-02-21 LAB — CBC
HEMATOCRIT: 34.1 % — AB (ref 36.0–46.0)
HEMOGLOBIN: 11.2 g/dL — AB (ref 12.0–15.0)
MCH: 26.4 pg (ref 26.0–34.0)
MCHC: 32.8 g/dL (ref 30.0–36.0)
MCV: 80.4 fL (ref 78.0–100.0)
Platelets: 164 10*3/uL (ref 150–400)
RBC: 4.24 MIL/uL (ref 3.87–5.11)
RDW: 14.6 % (ref 11.5–15.5)
WBC: 8.2 10*3/uL (ref 4.0–10.5)

## 2016-02-21 LAB — TYPE AND SCREEN
ABO/RH(D): O POS
Antibody Screen: NEGATIVE

## 2016-02-21 LAB — PROTEIN / CREATININE RATIO, URINE
Creatinine, Urine: 157 mg/dL
PROTEIN CREATININE RATIO: 0.11 mg/mg{creat} (ref 0.00–0.15)
Total Protein, Urine: 17 mg/dL

## 2016-02-21 MED ORDER — LACTATED RINGERS IV SOLN
INTRAVENOUS | Status: DC
  Administered 2016-02-21: 18:00:00 via INTRAVENOUS

## 2016-02-21 MED ORDER — ACETAMINOPHEN 325 MG PO TABS: 650.0000 mg | ORAL_TABLET | ORAL | Status: DC | PRN

## 2016-02-21 MED ORDER — FENTANYL CITRATE (PF) 100 MCG/2ML IJ SOLN
50.0000 ug | INTRAMUSCULAR | Status: DC | PRN
  Administered 2016-02-22 (×3): 100 ug via INTRAVENOUS
  Filled 2016-02-21 (×3): qty 2

## 2016-02-21 MED ORDER — FLEET ENEMA 7-19 GM/118ML RE ENEM: 1.0000 | ENEMA | RECTAL | Status: DC | PRN

## 2016-02-21 MED ORDER — OXYTOCIN 40 UNITS IN LACTATED RINGERS INFUSION - SIMPLE MED
1.0000 m[IU]/min | INTRAVENOUS | Status: DC
  Administered 2016-02-21: 2 m[IU]/min via INTRAVENOUS
  Filled 2016-02-21: qty 1000

## 2016-02-21 MED ORDER — TERBUTALINE SULFATE 1 MG/ML IJ SOLN
0.2500 mg | Freq: Once | INTRAMUSCULAR | Status: DC | PRN
  Filled 2016-02-21: qty 1

## 2016-02-21 MED ORDER — LACTATED RINGERS IV SOLN: 500.0000 mL | INTRAVENOUS | Status: DC | PRN

## 2016-02-21 MED ORDER — OXYCODONE-ACETAMINOPHEN 5-325 MG PO TABS: 2.0000 | ORAL_TABLET | ORAL | Status: DC | PRN

## 2016-02-21 MED ORDER — OXYCODONE-ACETAMINOPHEN 5-325 MG PO TABS: 1.0000 | ORAL_TABLET | ORAL | Status: DC | PRN

## 2016-02-21 MED ORDER — ONDANSETRON HCL 4 MG/2ML IJ SOLN: 4.0000 mg | Freq: Four times a day (QID) | INTRAMUSCULAR | Status: DC | PRN

## 2016-02-21 MED ORDER — PENICILLIN G POTASSIUM 5000000 UNITS IJ SOLR
5.0000 10*6.[IU] | Freq: Once | INTRAVENOUS | Status: AC
  Administered 2016-02-21: 5 10*6.[IU] via INTRAVENOUS
  Filled 2016-02-21: qty 5

## 2016-02-21 MED ORDER — LIDOCAINE HCL (PF) 1 % IJ SOLN
30.0000 mL | INTRAMUSCULAR | Status: DC | PRN
  Filled 2016-02-21: qty 30

## 2016-02-21 MED ORDER — OXYTOCIN BOLUS FROM INFUSION
500.0000 mL | INTRAVENOUS | Status: DC
  Administered 2016-02-22: 500 mL via INTRAVENOUS

## 2016-02-21 MED ORDER — OXYTOCIN 40 UNITS IN LACTATED RINGERS INFUSION - SIMPLE MED: 2.5000 [IU]/h | INTRAVENOUS | Status: DC

## 2016-02-21 MED ORDER — PENICILLIN G POTASSIUM 5000000 UNITS IJ SOLR
2.5000 10*6.[IU] | INTRAMUSCULAR | Status: DC
  Administered 2016-02-21 – 2016-02-22 (×3): 2.5 10*6.[IU] via INTRAVENOUS
  Filled 2016-02-21 (×8): qty 2.5

## 2016-02-21 MED ORDER — CITRIC ACID-SODIUM CITRATE 334-500 MG/5ML PO SOLN: 30.0000 mL | ORAL | Status: DC | PRN

## 2016-02-21 NOTE — Progress Notes (Signed)
Labor Progress Note Cathy Webster is a 25 y.o. [phone] at 2983w5d presented with contraction S: no complaint. Feels contraction.   O:  BP 135/66 mmHg  Pulse 101  Temp(Src) 98.1 F (36.7 C) (Oral)  Resp 20  Ht 5\' 6"  (1.676 m)  Wt 290 lb (131.543 kg)  BMI 46.83 kg/m2  LMP 05/12/2015 (Exact Date) EFM:130 /acels/mod var/no decels  CVE: Dilation: 5 (Internal os 4cm) Effacement (%): 0 Cervical Position: Posterior Station: Ballotable Presentation: Vertex Exam by:: Ivonne AndrewV. Smith, CNM    A&P: 25 y.o. [id number] 6883w5d with labor pain  #Labor: progessing. Membrane intact #Pain: as needed  #FWB: CAT-1 #GBS: positive. Getting pcn  Almon Herculesaye T Darra Rosa, MD 7:55 PM

## 2016-02-21 NOTE — H&P (Signed)
FACULTY PRACTICE ANTEPARTUM ADMISSION HISTORY AND PHYSICAL NOTE   History of Present Illness: Cathy Webster is a 25 y.o. [phone] at 5755w5d admitted for contraction/abdominal pain. Had headache two days ago. Has not had further headache since then. Denies vision changes, RUQ pain, leg swelling. Denies other symptoms Patient reports the fetal movement as active. Patient reports uterine contraction  activity as regular, every ~4 minutes. Patient reports  vaginal bleeding as none. Patient describes fluid per vagina as None. Fetal presentation is cephalic.   VBAC consent signed 01/26/16 Clinic  WOC Prenatal Labs  Dating  LMP, consistent with 13 wk ultrasound Blood type: O/POS/-- (11/02 1343)   Genetic Screen 1 Screen: NT nml, poor visualization of nasal bone    AFP:     Quad:     NIPS: Antibody:NEG (11/02 1343)  Anatomic US  Normal except Bilateral UTD 4mm seen  >>  F/U showed resolution Rubella: 6.08 (11/02 1343)  GTT Early:  73             Third trimester: 76 RPR: NON REAC (02/28 1216)   Flu vaccine  Declines HBsAg: NEGATIVE (11/02 1343)   TDaP vaccine  Declines                                         Rhogam: N/A HIV: NONREACTIVE (02/28 1216)   Baby Food  Breast                                             GBS:  Positive (For PCN allergy, check sensitivities)  Contraception  Considering IUD, liked pills but forgot some Pap:  Negative  Circumcision N/a, female   Pediatrician  [facility]   Support Person  Darius Blue (FO[facility])    Patient Active Problem List   Diagnosis Date Noted  . Active labor 02/17/2016  . Kidney abnormality of fetus on prenatal ultrasound 11/30/2015  . Supervision of high risk pregnancy, antepartum 08/10/2015  . History of cesarean section, low transverse 03/16/2014  . Abnormal Pap smear of cervix 12/22/2013  . Short interval between pregnancies complicating pregnancy, antepartum 11/24/2013    Past Medical History  Diagnosis Date  . Abnormal Pap smear 2008     Past Surgical History  Procedure Laterality Date  . Cesarean section    . Tonsillectomy      OB History  Gravida Para Term Preterm AB SAB TAB Ectopic Multiple Living  4 3 3       3     # Outcome Date GA Lbr Len/2nd Weight Sex Delivery Anes PTL Lv  4 Current           3 Term 06/09/14 7221w6d 09:38 / 00:13 7 lb 7.6 oz (3.39 kg) M VBAC EPI  Y  2 Term 03/24/13 6838w1d 06:25 / 00:13 6 lb 10.9 oz (3.03 kg) F VBAC EPI  Y  1 Term 08/07/08 4961w0d  6 lb 14 oz (3.118 kg) F CS-Unspec EPI  Y    History of C/s for fetal indication  Social History   Social History  . Marital Status: Single    Spouse Name: N/A  . Number of Children: N/A  . Years of Education: N/A   Social History Main Topics  . Smoking status: Never Smoker   .  Smokeless tobacco: Never Used  . Alcohol Use: No  . Drug Use: No  . Sexual Activity: Yes    Birth Control/ Protection: NA   Other Topics Concern  . Not on file   Social History Narrative    Family History  Problem Relation Age of Onset  . Diabetes Mother   . Hypertension Mother   . Asthma Brother     No Known Allergies  Prescriptions prior to admission  Medication Sig Dispense Refill Last Dose  . Prenatal Multivit-Min-Fe-FA (PRENATAL VITAMINS) 0.8 MG tablet Take 1 tablet by mouth daily. (Patient not taking: Reported on 02/21/2016) 30 tablet 12 Not Taking at Unknown time  . zolpidem (AMBIEN) 5 MG tablet Take 1 tablet (5 mg total) by mouth at bedtime as needed for sleep. (Patient not taking: Reported on 02/21/2016) 15 tablet 0 Not Taking at Unknown time    Review of Systems - Negative except abdominal pain  Vitals:  BP 142/70 mmHg  Pulse 107  Temp(Src) 98 F (36.7 C)  Resp 20  LMP 05/12/2015 (Exact Date) Physical Examination: CONSTITUTIONAL: Well-developed, well-nourished female in no acute distress.  HENT:  Normocephalic, atraumatic, External right and left ear normal. Oropharynx is clear and moist EYES: Conjunctivae and EOM are normal. Pupils are  equal, round, and reactive to light. No scleral icterus.  NECK: Normal range of motion, supple, no masses SKIN: Skin is warm and dry. No rash noted. Not diaphoretic. No erythema. No pallor. NEUROLGIC: Alert and oriented to person, place, and time. Normal reflexes, muscle tone coordination. No cranial nerve deficit noted. PSYCHIATRIC: Normal mood and affect. Normal behavior. Normal judgment and thought content. CARDIOVASCULAR: Normal heart rate noted, regular rhythm RESPIRATORY: Effort and breath sounds normal, no problems with respiration noted ABDOMEN: Soft, nontender, nondistended, gravid. MUSCULOSKELETAL: Normal range of motion. No edema and no tenderness. 2+ distal pulses.  Cervix: cervix 3 cm/ thick and fetal presentation is cephalic. Membranes:intact Fetal Monitoring:Baseline: 150 bpm Tocometer: contraction about 5 minutes apart  Labs:  Results for orders placed or performed during the hospital encounter of 02/21/16 (from the past 24 hour(s))  Protein / creatinine ratio, urine   Collection Time: 02/21/16  3:30 PM  Result Value Ref Range   Creatinine, Urine 157.00 mg/dL   Total Protein, Urine 17 mg/dL   Protein Creatinine Ratio 0.11 0.00 - 0.15 mg/mg[Cre]  Urinalysis, Routine w reflex microscopic (not at [facility])   Collection Time: 02/21/16  3:30 PM  Result Value Ref Range   Color, Urine YELLOW YELLOW   APPearance CLEAR CLEAR   Specific Gravity, Urine 1.020 1.005 - 1.030   pH 6.5 5.0 - 8.0   Glucose, UA NEGATIVE NEGATIVE mg/dL   Hgb urine dipstick SMALL (A) NEGATIVE   Bilirubin Urine NEGATIVE NEGATIVE   Ketones, ur 15 (A) NEGATIVE mg/dL   Protein, ur NEGATIVE NEGATIVE mg/dL   Nitrite NEGATIVE NEGATIVE   Leukocytes, UA TRACE (A) NEGATIVE  Urine microscopic-add on   Collection Time: 02/21/16  3:30 PM  Result Value Ref Range   Squamous Epithelial / LPF 0-5 (A) NONE SEEN   WBC, UA 0-5 0 - 5 WBC/hpf   RBC / HPF 0-5 0 - 5 RBC/hpf   Bacteria, UA FEW (A) NONE SEEN   Urine-Other  MUCOUS PRESENT   CBC   Collection Time: 02/21/16  3:51 PM  Result Value Ref Range   WBC 8.2 4.0 - 10.5 K/uL   RBC 4.24 3.87 - 5.11 MIL/uL   Hemoglobin 11.2 (L) 12.0 - 15.0 g/dL  HCT 34.1 (L) 36.0 - 46.0 %   MCV 80.4 78.0 - 100.0 fL   MCH 26.4 26.0 - 34.0 pg   MCHC 32.8 30.0 - 36.0 g/dL   RDW 82.9 56.2 - 13.0 %   Platelets 164 150 - 400 K/uL  Comprehensive metabolic panel   Collection Time: 02/21/16  3:51 PM  Result Value Ref Range   Sodium 137 135 - 145 mmol/L   Potassium 3.4 (L) 3.5 - 5.1 mmol/L   Chloride 106 101 - 111 mmol/L   CO2 24 22 - 32 mmol/L   Glucose, Bld 78 65 - 99 mg/dL   BUN <6 (L) 6 - 20 mg/dL   Creatinine, Ser 8.65 0.44 - 1.00 mg/dL   Calcium 8.6 (L) 8.9 - 10.3 mg/dL   Total Protein 7.1 6.5 - 8.1 g/dL   Albumin 3.0 (L) 3.5 - 5.0 g/dL   AST 22 15 - 41 U/L   ALT 19 14 - 54 U/L   Alkaline Phosphatase 80 38 - 126 U/L   Total Bilirubin 0.2 (L) 0.3 - 1.2 mg/dL   GFR calc non Af Amer >60 >60 mL/min   GFR calc Af Amer >60 >60 mL/min   Anion gap 7 5 - 15  Type and screen [facility]   Collection Time: 02/21/16  3:51 PM  Result Value Ref Range   ABO/RH(D) O POS    Antibody Screen NEG    Sample Expiration 02/24/2016     Imaging Studies: No results found.   Assessment and Plan: Patient Active Problem List   Diagnosis Date Noted  . Active labor 02/17/2016  . Kidney abnormality of fetus on prenatal ultrasound 11/30/2015  . Supervision of high risk pregnancy, antepartum 08/10/2015  . History of cesarean section, low transverse 03/16/2014  . Abnormal Pap smear of cervix 12/22/2013  . Short interval between pregnancies complicating pregnancy, antepartum 11/24/2013   Admit to Antenatal given gestational hypertension. She is at [redacted]w[redacted]d  Will allow labor to progress as routine for few hours.   If no progress, will induce with pitocin.   GBS +ve: will start penicillin G  Obtain pre-eclampsia labs   Almon Hercules, MD FM  resident-PGY-1   I was present for the exam and agree with above. Augment PRN.   [place], CNM 02/21/2016 8:40 PM

## 2016-02-21 NOTE — MAU Note (Signed)
Urine sent to lab 

## 2016-02-21 NOTE — MAU Note (Signed)
Contractions all day but more noticeable since 1200. Denies LOF or bleeding. 3cm last SVE.

## 2016-02-21 NOTE — Anesthesia Pain Management Evaluation Note (Signed)
  CRNA Pain Management Visit Note  Patient: Cathy Webster, 25 y.o., female  "Hello I am a member of the anesthesia team at [facility]. We have an anesthesia team available at all times to provide care throughout the hospital, including epidural management and anesthesia for C-section. I don't know your plan for the delivery whether it a natural birth, water birth, IV sedation, nitrous supplementation, doula or epidural, but we want to meet your pain goals."   1.Was your pain managed to your expectations on prior hospitalizations?   Yes   2.What is your expectation for pain management during this hospitalization?     Epidural  3.How can we help you reach that goal?epidural  Record the patient's initial score and the patient's pain goal.   Pain: 0  Pain Goal: 8 [facility] wants you to be able to say your pain was always managed very well.  Cephus ShellingBURGER,Cathy Webster 02/21/2016

## 2016-02-22 ENCOUNTER — Encounter (HOSPITAL_COMMUNITY): Payer: Self-pay

## 2016-02-22 ENCOUNTER — Inpatient Hospital Stay (HOSPITAL_COMMUNITY): Payer: BC Managed Care – PPO | Admitting: Anesthesiology

## 2016-02-22 DIAGNOSIS — O99824 Streptococcus B carrier state complicating childbirth: Secondary | ICD-10-CM

## 2016-02-22 DIAGNOSIS — Z3A40 40 weeks gestation of pregnancy: Secondary | ICD-10-CM

## 2016-02-22 DIAGNOSIS — O34219 Maternal care for unspecified type scar from previous cesarean delivery: Secondary | ICD-10-CM

## 2016-02-22 LAB — RPR: RPR: NONREACTIVE

## 2016-02-22 MED ORDER — TETANUS-DIPHTH-ACELL PERTUSSIS 5-2.5-18.5 LF-MCG/0.5 IM SUSP
0.5000 mL | Freq: Once | INTRAMUSCULAR | Status: AC
  Administered 2016-02-23: 0.5 mL via INTRAMUSCULAR
  Filled 2016-02-22: qty 0.5

## 2016-02-22 MED ORDER — DIBUCAINE 1 % RE OINT: 1.0000 "application " | TOPICAL_OINTMENT | RECTAL | Status: DC | PRN

## 2016-02-22 MED ORDER — LIDOCAINE HCL (PF) 1 % IJ SOLN
INTRAMUSCULAR | Status: DC | PRN
  Administered 2016-02-22 (×2): 4 mL

## 2016-02-22 MED ORDER — EPHEDRINE 5 MG/ML INJ
10.0000 mg | INTRAVENOUS | Status: DC | PRN
  Filled 2016-02-22: qty 2

## 2016-02-22 MED ORDER — PHENYLEPHRINE 40 MCG/ML (10ML) SYRINGE FOR IV PUSH (FOR BLOOD PRESSURE SUPPORT)
80.0000 ug | PREFILLED_SYRINGE | INTRAVENOUS | Status: DC | PRN
  Filled 2016-02-22: qty 5

## 2016-02-22 MED ORDER — ZOLPIDEM TARTRATE 5 MG PO TABS: 5.0000 mg | ORAL_TABLET | Freq: Every evening | ORAL | Status: DC | PRN

## 2016-02-22 MED ORDER — FENTANYL CITRATE (PF) 100 MCG/2ML IJ SOLN: 100.0000 ug | INTRAMUSCULAR | Status: DC | PRN

## 2016-02-22 MED ORDER — SENNOSIDES-DOCUSATE SODIUM 8.6-50 MG PO TABS
2.0000 | ORAL_TABLET | ORAL | Status: DC
  Administered 2016-02-22: 2 via ORAL
  Filled 2016-02-22: qty 2

## 2016-02-22 MED ORDER — DIPHENHYDRAMINE HCL 25 MG PO CAPS: 25.0000 mg | ORAL_CAPSULE | Freq: Four times a day (QID) | ORAL | Status: DC | PRN

## 2016-02-22 MED ORDER — COCONUT OIL OIL: 1.0000 "application " | TOPICAL_OIL | Status: DC | PRN

## 2016-02-22 MED ORDER — DIPHENHYDRAMINE HCL 50 MG/ML IJ SOLN: 12.5000 mg | INTRAMUSCULAR | Status: DC | PRN

## 2016-02-22 MED ORDER — WITCH HAZEL-GLYCERIN EX PADS: 1.0000 "application " | MEDICATED_PAD | CUTANEOUS | Status: DC | PRN

## 2016-02-22 MED ORDER — SIMETHICONE 80 MG PO CHEW: 80.0000 mg | CHEWABLE_TABLET | ORAL | Status: DC | PRN

## 2016-02-22 MED ORDER — LACTATED RINGERS IV SOLN
INTRAVENOUS | Status: DC
  Administered 2016-02-22: 12:00:00 via INTRAUTERINE

## 2016-02-22 MED ORDER — ONDANSETRON HCL 4 MG PO TABS: 4.0000 mg | ORAL_TABLET | ORAL | Status: DC | PRN

## 2016-02-22 MED ORDER — IBUPROFEN 600 MG PO TABS
600.0000 mg | ORAL_TABLET | Freq: Four times a day (QID) | ORAL | Status: DC
  Administered 2016-02-22 – 2016-02-23 (×4): 600 mg via ORAL
  Filled 2016-02-22 (×4): qty 1

## 2016-02-22 MED ORDER — ONDANSETRON HCL 4 MG/2ML IJ SOLN: 4.0000 mg | INTRAMUSCULAR | Status: DC | PRN

## 2016-02-22 MED ORDER — MEASLES, MUMPS & RUBELLA VAC ~~LOC~~ INJ
0.5000 mL | INJECTION | Freq: Once | SUBCUTANEOUS | Status: DC
  Filled 2016-02-22: qty 0.5

## 2016-02-22 MED ORDER — BENZOCAINE-MENTHOL 20-0.5 % EX AERO: 1.0000 "application " | INHALATION_SPRAY | CUTANEOUS | Status: DC | PRN

## 2016-02-22 MED ORDER — PRENATAL MULTIVITAMIN CH
1.0000 | ORAL_TABLET | Freq: Every day | ORAL | Status: DC
  Administered 2016-02-23: 1 via ORAL
  Filled 2016-02-22: qty 1

## 2016-02-22 MED ORDER — LACTATED RINGERS IV SOLN
500.0000 mL | Freq: Once | INTRAVENOUS | Status: AC
Start: 2016-02-22 — End: 2016-02-22
  Administered 2016-02-22: 1000 mL via INTRAVENOUS

## 2016-02-22 MED ORDER — PHENYLEPHRINE 40 MCG/ML (10ML) SYRINGE FOR IV PUSH (FOR BLOOD PRESSURE SUPPORT)
80.0000 ug | PREFILLED_SYRINGE | INTRAVENOUS | Status: DC | PRN
  Filled 2016-02-22: qty 5
  Filled 2016-02-22: qty 10

## 2016-02-22 MED ORDER — FENTANYL 2.5 MCG/ML BUPIVACAINE 1/10 % EPIDURAL INFUSION (WH - ANES)
14.0000 mL/h | INTRAMUSCULAR | Status: DC | PRN
  Administered 2016-02-22: 14 mL/h via EPIDURAL
  Filled 2016-02-22: qty 125

## 2016-02-22 MED ORDER — ACETAMINOPHEN 325 MG PO TABS
650.0000 mg | ORAL_TABLET | ORAL | Status: DC | PRN
  Administered 2016-02-22 – 2016-02-23 (×2): 650 mg via ORAL
  Filled 2016-02-22 (×2): qty 2

## 2016-02-22 NOTE — Anesthesia Preprocedure Evaluation (Signed)
Anesthesia Evaluation  Patient identified by MRN, date of birth, ID band Patient awake    Reviewed: Allergy & Precautions, NPO status , Patient's Chart, lab work & pertinent test results  History of Anesthesia Complications Negative for: history of anesthetic complications  Airway Mallampati: II  TM Distance: >3 FB Neck ROM: Full    Dental no notable dental hx. (+) Dental Advisory Given   Pulmonary neg pulmonary ROS,    Pulmonary exam normal breath sounds clear to auscultation       Cardiovascular hypertension, Normal cardiovascular exam Rhythm:Regular Rate:Normal     Neuro/Psych negative neurological ROS  negative psych ROS   GI/Hepatic negative GI ROS, Neg liver ROS,   Endo/Other  Morbid obesity  Renal/GU negative Renal ROS  negative genitourinary   Musculoskeletal negative musculoskeletal ROS (+)   Abdominal   Peds negative pediatric ROS (+)  Hematology negative hematology ROS (+)   Anesthesia Other Findings   Reproductive/Obstetrics (+) Pregnancy                             Anesthesia Physical Anesthesia Plan  ASA: III  Anesthesia Plan: Epidural   Post-op Pain Management:    Induction:   Airway Management Planned:   Additional Equipment:   Intra-op Plan:   Post-operative Plan:   Informed Consent: I have reviewed the patients History and Physical, chart, labs and discussed the procedure including the risks, benefits and alternatives for the proposed anesthesia with the patient or authorized representative who has indicated his/her understanding and acceptance.   Dental advisory given  Plan Discussed with: CRNA  Anesthesia Plan Comments:         Anesthesia Quick Evaluation  

## 2016-02-22 NOTE — Anesthesia Procedure Notes (Signed)
Epidural Patient location during procedure: OB  Staffing Anesthesiologist: Jasyah Theurer Performed by: anesthesiologist   Preanesthetic Checklist Completed: patient identified, site marked, surgical consent, pre-op evaluation, timeout performed, IV checked, risks and benefits discussed and monitors and equipment checked  Epidural Patient position: sitting Prep: site prepped and draped and DuraPrep Patient monitoring: continuous pulse ox and blood pressure Approach: midline Location: L3-L4 Injection technique: LOR saline  Needle:  Needle type: Tuohy  Needle gauge: 17 G Needle length: 9 cm and 9 Needle insertion depth: 7 cm Catheter type: closed end flexible Catheter size: 19 Gauge Catheter at skin depth: 12 cm Test dose: negative  Assessment Events: blood not aspirated, injection not painful, no injection resistance, negative IV test and no paresthesia  Additional Notes Patient identified. Risks/Benefits/Options discussed with patient including but not limited to bleeding, infection, nerve damage, paralysis, failed block, incomplete pain control, headache, blood pressure changes, nausea, vomiting, reactions to medication both or allergic, itching and postpartum back pain. Confirmed with bedside nurse the patient's most recent platelet count. Confirmed with patient that they are not currently taking any anticoagulation, have any bleeding history or any family history of bleeding disorders. Patient expressed understanding and wished to proceed. All questions were answered. Sterile technique was used throughout the entire procedure. Please see nursing notes for vital signs. Test dose was given through epidural catheter and negative prior to continuing to dose epidural or start infusion. Warning signs of high block given to the patient including shortness of breath, tingling/numbness in hands, complete motor block, or any concerning symptoms with instructions to call for help. Patient was  given instructions on fall risk and not to get out of bed. All questions and concerns addressed with instructions to call with any issues or inadequate analgesia.      

## 2016-02-22 NOTE — Progress Notes (Signed)
Labor Progress Note Judye BosKeyeria Rigor is a 25 y.o. [phone] at 5555w5d presented with contraction S: no complaint. Feels contractions but does not need medications yet. Wants to try to get some sleep  O:  BP 125/79 mmHg  Pulse 98  Temp(Src) 98.4 F (36.9 C) (Oral)  Resp 16  Ht 5\' 6"  (1.676 m)  Wt 290 lb (131.543 kg)  BMI 46.83 kg/m2  LMP 05/12/2015 (Exact Date) EFM:130 /acels/mod var/no decels  CVE: Dilation: 4 (Internal os 4cm) Effacement (%): 0 Cervical Position: Posterior Station: Ballotable Presentation: Vertex Exam by:: Ivonne AndrewV. Smith, CNM    A&P: 25 y.o. [id number] 2155w5d with labor pain  #Labor: progessing. Membrane intact and on pitocin. AROM when better applied #Pain: as needed  #FWB: CAT-1 #GBS: positive. Getting pcn  Federico FlakeKimberly Niles Dickson Kostelnik, MD 6:43 AM

## 2016-02-22 NOTE — Anesthesia Postprocedure Evaluation (Signed)
Anesthesia Post Note  Patient: Neria StricklJudye Bosand  Procedure(s) Performed: * No procedures listed *  Patient location during evaluation: Mother Baby Anesthesia Type: Epidural Level of consciousness: awake and alert and oriented Pain management: satisfactory to patient Vital Signs Assessment: post-procedure vital signs reviewed and stable Respiratory status: spontaneous breathing and nonlabored ventilation Cardiovascular status: stable Postop Assessment: no headache, no backache, no signs of nausea or vomiting, adequate PO intake and patient able to bend at knees (patient up walking) Anesthetic complications: no     Last Vitals:  Filed Vitals:   02/22/16 1400 02/22/16 1455  BP: 141/66 135/77  Pulse: 115 113  Temp: 37.1 C 36.8 C  Resp: 18 18    Last Pain:  Filed Vitals:   02/22/16 1653  PainSc: 4    Pain Goal: Patients Stated Pain Goal: 8 (02/21/16 1744)               Madison HickmanGREGORY,Zeyna Mkrtchyan

## 2016-02-22 NOTE — Progress Notes (Signed)
Patient is 25 y.o. [id number] 1004w6d admitted with contraction, hx of C/s and two VBAC's  Delivery Note At 11:57 AM a viable female was delivered via, VBAC, Spontaneous (Presentation: Occiput Anterior).  APGAR: 8, 9; weight 6 lb 8.4 oz (2960 g).   Placenta status: Intact, Spontaneous.  Cord: 3 vessels with the following complications: None.   Anesthesia: Epidural  Episiotomy: None Lacerations: None Est. Blood Loss (mL): 200 Uterus: firm  Patient was GBS positive which was adequately treated with PCN.  Mom to postpartum.  Baby to Couplet care / Skin to Skin.  [name] 02/22/2016, 1:38 PM

## 2016-02-22 NOTE — Lactation Note (Signed)
This note was copied from a baby's chart. Lactation Consultation Note  Patient Name: Cathy Webster ZOXWR'UToday's Date: 02/22/2016 Reason for consult: Initial assessment Baby at 6 hr of life and mom has only bf on the L side. She stated the R side is very sore and she has not been able to latch any of her children to that breast. She latches to the L and pumps the R. She asked for help latching baby to the R because she is "hoping with help bf will be better this time". Baby was swaddled and sleeping. Demonstrated waking techniques but baby was not ready to feed. Placed baby sts in cradle position and had mom manually express a drop onto baby's lips. Encouraged her to use this position to latch baby when she wakes. Discussed baby behavior, feeding frequency, baby belly size, voids, wt loss, breast changes, and nipple care. Given lactation handouts. Aware of OP services and support group.    Maternal Data Has patient been taught Hand Expression?: Yes Does the patient have breastfeeding experience prior to this delivery?: Yes  Feeding Feeding Type: Breast Fed Length of feed: 25 min  LATCH Score/Interventions Latch: Repeated attempts needed to sustain latch, nipple held in mouth throughout feeding, stimulation needed to elicit sucking reflex. Intervention(s): Adjust position;Assist with latch;Breast massage;Breast compression  Audible Swallowing: A few with stimulation Intervention(s): Skin to skin;Hand expression  Type of Nipple: Everted at rest and after stimulation  Comfort (Breast/Nipple): Soft / non-tender     Hold (Positioning): Assistance needed to correctly position infant at breast and maintain latch.  LATCH Score: 7  Lactation Tools Discussed/Used WIC Program: No   Consult Status Consult Status: Follow-up Date: 02/23/16 Follow-up type: In-patient    Rulon Eisenmengerlizabeth E Italia Wolfert 02/22/2016, 6:15 PM

## 2016-02-23 ENCOUNTER — Ambulatory Visit: Payer: Self-pay

## 2016-02-23 ENCOUNTER — Inpatient Hospital Stay (HOSPITAL_COMMUNITY): Admission: RE | Admit: 2016-02-23 | Payer: BC Managed Care – PPO | Source: Ambulatory Visit

## 2016-02-23 MED ORDER — IBUPROFEN 600 MG PO TABS: 600.0000 mg | ORAL_TABLET | Freq: Four times a day (QID) | ORAL | Status: DC | PRN

## 2016-02-23 NOTE — Discharge Summary (Signed)
OB Discharge Summary     Patient Name: Cathy Webster DOB: 01-16-1991 MRN: [id number]  Date of admission: 02/21/2016 Delivering MD: Illene Bolus A   Date of discharge: 02/23/2016  Admitting diagnosis: 40WKS,LABOR Intrauterine pregnancy: [redacted]w[redacted]d     Secondary diagnosis:  Principal Problem:   Transient hypertension  Additional problems: none     Discharge diagnosis: Term Pregnancy Delivered and VBAC                                                                                                Post partum procedures:none  Augmentation: Pitocin  Complications: None  Hospital course:  Onset of Labor With Vaginal Delivery     25 y.o. yo [id number] at [redacted]w[redacted]d was admitted in Latent Labor on 02/21/2016. Patient had an uncomplicated labor course as follows:  Membrane Rupture Time/Date: 6:51 AM ,02/22/2016   Intrapartum Procedures: Episiotomy: None [1]                                         Lacerations:  None [1]  Patient had a delivery of a Viable infant. 02/22/2016  Information for the patient's newborn:  Makailyn, Mccormick [[id number]]  Delivery Method: VBAC, Spontaneous (Filed from Delivery Summary)    Pateint had an uncomplicated postpartum course.  She is ambulating, tolerating a regular diet, passing flatus, and urinating well. Patient is discharged home in stable condition on 02/23/2016.    Physical exam  Filed Vitals:   02/22/16 1455 02/22/16 1900 02/23/16 0300 02/23/16 0511  BP: 135/77 112/67 128/66 147/75  Pulse: 113 103 70 97  Temp: 98.2 F (36.8 C) 98.3 F (36.8 C) 98.1 F (36.7 C) 98.2 F (36.8 C)  TempSrc: Oral Oral Oral Oral  Resp: Height:      Weight:      SpO2:  100%     General: alert and cooperative Lochia: appropriate Uterine Fundus: firm Incision: N/A DVT Evaluation: No evidence of DVT seen on physical exam. Labs: Lab Results  Component Value Date   WBC 8.2 02/21/2016   HGB 11.2* 02/21/2016   HCT 34.1* 02/21/2016   MCV  80.4 02/21/2016   PLT 164 02/21/2016   CMP Latest Ref Rng 02/21/2016  Glucose 65 - 99 mg/dL 78  BUN 6 - 20 mg/dL <6(N)  Creatinine 6.29 - 1.00 mg/dL 5.28  Sodium 413 - 244 mmol/L 137  Potassium 3.5 - 5.1 mmol/L 3.4(L)  Chloride 101 - 111 mmol/L 106  CO2 22 - 32 mmol/L 24  Calcium 8.9 - 10.3 mg/dL 0.1(U)  Total Protein 6.5 - 8.1 g/dL 7.1  Total Bilirubin 0.3 - 1.2 mg/dL 2.7(O)  Alkaline Phos 38 - 126 U/L 80  AST 15 - 41 U/L 22  ALT 14 - 54 U/L 19    Discharge instruction: per After Visit Summary and "Baby and Me Booklet".  After visit meds:    Medication List    STOP taking these medications        zolpidem 5  MG tablet  Commonly known as:  AMBIEN      TAKE these medications        ibuprofen 600 MG tablet  Commonly known as:  ADVIL,MOTRIN  Take 1 tablet (600 mg total) by mouth every 6 (six) hours as needed.     Prenatal Vitamins 0.8 MG tablet  Take 1 tablet by mouth daily.        Diet: routine diet  Activity: Advance as tolerated. Pelvic rest for 6 weeks.   Outpatient follow up:6 weeks Follow up Appt:No future appointments. Follow up Visit:No Follow-up on file.  Postpartum contraception: IUD Mirena  Newborn Data: Live born female  Birth Weight: 6 lb 8.4 oz (2960 g) APGAR: 8, 9  Baby Feeding: Breast Disposition:home with mother   02/23/2016 Cam HaiSHAW, KIMBERLY, CNM  9:32 AM

## 2016-02-23 NOTE — Discharge Instructions (Signed)

## 2016-02-23 NOTE — Lactation Note (Signed)
This note was copied from a baby's chart. Lactation Consultation Note F/u on output since baby had kidney abnormality on Ultra sound. Had 1 stool, 1 void, 1 emesis. BF well. Patient Name: Cathy Webster WUJWJ'XToday's Date: 02/23/2016 Reason for consult: Follow-up assessment   Maternal Data    Feeding    LATCH Score/Interventions                      Lactation Tools Discussed/Used     Consult Status Consult Status: Follow-up Date: 02/23/16 Follow-up type: In-patient    Clarnce Homan, Diamond NickelLAURA G 02/23/2016, 3:25 AM

## 2016-02-23 NOTE — Lactation Note (Signed)
This note was copied from a baby's chart. Lactation Consultation Note  Patient Name: Cathy Webster WUJWJ'XToday's Date: 02/23/2016 Reason for consult: Follow-up assessment Baby at 32 hr of life. Mom told RN that she "is done bf".   Maternal Data    Feeding Feeding Type: Formula  LATCH Score/Interventions                      Lactation Tools Discussed/Used     Consult Status Consult Status: Complete    [name] 02/23/2016, 8:50 PM

## 2016-03-01 ENCOUNTER — Encounter (HOSPITAL_COMMUNITY): Payer: Self-pay

## 2016-03-01 ENCOUNTER — Inpatient Hospital Stay (HOSPITAL_COMMUNITY)
Admission: AD | Admit: 2016-03-01 | Discharge: 2016-03-03 | DRG: 776 | Disposition: A | Discharge status: Home / Self Care | Payer: BC Managed Care – PPO | Source: Ambulatory Visit | Attending: Obstetrics & Gynecology | Admitting: Obstetrics & Gynecology

## 2016-03-01 DIAGNOSIS — O1415 Severe pre-eclampsia, complicating the puerperium: Principal | ICD-10-CM | POA: Diagnosis present

## 2016-03-01 DIAGNOSIS — R51 Headache: Secondary | ICD-10-CM | POA: Diagnosis present

## 2016-03-01 DIAGNOSIS — O1495 Unspecified pre-eclampsia, complicating the puerperium: Secondary | ICD-10-CM | POA: Diagnosis not present

## 2016-03-01 DIAGNOSIS — R258 Other abnormal involuntary movements: Secondary | ICD-10-CM | POA: Diagnosis present

## 2016-03-01 DIAGNOSIS — R609 Edema, unspecified: Secondary | ICD-10-CM | POA: Diagnosis present

## 2016-03-01 DIAGNOSIS — Z8249 Family history of ischemic heart disease and other diseases of the circulatory system: Secondary | ICD-10-CM

## 2016-03-01 DIAGNOSIS — R7989 Other specified abnormal findings of blood chemistry: Secondary | ICD-10-CM | POA: Diagnosis present

## 2016-03-01 DIAGNOSIS — R1013 Epigastric pain: Secondary | ICD-10-CM | POA: Diagnosis present

## 2016-03-01 HISTORY — DX: Essential (primary) hypertension: I10

## 2016-03-01 LAB — CBC
HEMATOCRIT: 33.7 % — AB (ref 36.0–46.0)
HEMOGLOBIN: 11.2 g/dL — AB (ref 12.0–15.0)
MCH: 26.5 pg (ref 26.0–34.0)
MCHC: 33.2 g/dL (ref 30.0–36.0)
MCV: 79.7 fL (ref 78.0–100.0)
PLATELETS: 178 10*3/uL (ref 150–400)
RBC: 4.23 MIL/uL (ref 3.87–5.11)
RDW: 14.2 % (ref 11.5–15.5)
WBC: 7.5 10*3/uL (ref 4.0–10.5)

## 2016-03-01 LAB — URINALYSIS, ROUTINE W REFLEX MICROSCOPIC
Bilirubin Urine: NEGATIVE
GLUCOSE, UA: NEGATIVE mg/dL
KETONES UR: NEGATIVE mg/dL
Nitrite: NEGATIVE
PROTEIN: NEGATIVE mg/dL
Specific Gravity, Urine: 1.01 (ref 1.005–1.030)
pH: 6 (ref 5.0–8.0)

## 2016-03-01 LAB — COMPREHENSIVE METABOLIC PANEL
ALT: 203 U/L — ABNORMAL HIGH (ref 14–54)
ANION GAP: 8 (ref 5–15)
AST: 121 U/L — ABNORMAL HIGH (ref 15–41)
Albumin: 3.2 g/dL — ABNORMAL LOW (ref 3.5–5.0)
Alkaline Phosphatase: 85 U/L (ref 38–126)
BUN: 14 mg/dL (ref 6–20)
CHLORIDE: 107 mmol/L (ref 101–111)
CO2: 25 mmol/L (ref 22–32)
Calcium: 8.7 mg/dL — ABNORMAL LOW (ref 8.9–10.3)
Creatinine, Ser: 0.67 mg/dL (ref 0.44–1.00)
Glucose, Bld: 83 mg/dL (ref 65–99)
POTASSIUM: 3.6 mmol/L (ref 3.5–5.1)
SODIUM: 140 mmol/L (ref 135–145)
Total Bilirubin: 0.3 mg/dL (ref 0.3–1.2)
Total Protein: 7 g/dL (ref 6.5–8.1)

## 2016-03-01 LAB — URINE MICROSCOPIC-ADD ON

## 2016-03-01 LAB — PROTEIN / CREATININE RATIO, URINE
Creatinine, Urine: 70 mg/dL
PROTEIN CREATININE RATIO: 0.13 mg/mg{creat} (ref 0.00–0.15)
Total Protein, Urine: 9 mg/dL

## 2016-03-01 MED ORDER — HYDROCHLOROTHIAZIDE 12.5 MG PO CAPS
12.5000 mg | ORAL_CAPSULE | Freq: Every day | ORAL | Status: DC
  Administered 2016-03-02 – 2016-03-03 (×2): 12.5 mg via ORAL
  Filled 2016-03-01 (×3): qty 1

## 2016-03-01 MED ORDER — LABETALOL HCL 5 MG/ML IV SOLN
20.0000 mg | INTRAVENOUS | Status: DC | PRN
  Administered 2016-03-01: 20 mg via INTRAVENOUS
  Filled 2016-03-01: qty 4

## 2016-03-01 MED ORDER — OXYCODONE-ACETAMINOPHEN 5-325 MG PO TABS
2.0000 | ORAL_TABLET | Freq: Once | ORAL | Status: AC
  Administered 2016-03-01: 2 via ORAL
  Filled 2016-03-01: qty 2

## 2016-03-01 MED ORDER — MAGNESIUM SULFATE BOLUS VIA INFUSION
4.0000 g | Freq: Once | INTRAVENOUS | Status: AC
  Administered 2016-03-02: 4 g via INTRAVENOUS
  Filled 2016-03-01: qty 500

## 2016-03-01 MED ORDER — HYDRALAZINE HCL 20 MG/ML IJ SOLN: 10.0000 mg | Freq: Once | INTRAMUSCULAR | Status: DC | PRN

## 2016-03-01 MED ORDER — LISINOPRIL 10 MG PO TABS
10.0000 mg | ORAL_TABLET | Freq: Every day | ORAL | Status: DC
  Administered 2016-03-02 – 2016-03-03 (×3): 10 mg via ORAL
  Filled 2016-03-01 (×4): qty 1

## 2016-03-01 MED ORDER — IBUPROFEN 600 MG PO TABS
600.0000 mg | ORAL_TABLET | Freq: Once | ORAL | Status: AC
  Administered 2016-03-02: 600 mg via ORAL
  Filled 2016-03-01: qty 1

## 2016-03-01 MED ORDER — ZOLPIDEM TARTRATE 5 MG PO TABS: 5.0000 mg | ORAL_TABLET | Freq: Every evening | ORAL | Status: DC | PRN

## 2016-03-01 MED ORDER — LACTATED RINGERS IV SOLN
INTRAVENOUS | Status: DC
  Administered 2016-03-01 – 2016-03-02 (×2): via INTRAVENOUS

## 2016-03-01 MED ORDER — MAGNESIUM SULFATE 50 % IJ SOLN
2.0000 g/h | INTRAMUSCULAR | Status: AC
  Administered 2016-03-02 (×2): 2 g/h via INTRAVENOUS
  Filled 2016-03-01 (×2): qty 80

## 2016-03-01 MED ORDER — PRENATAL MULTIVITAMIN CH
1.0000 | ORAL_TABLET | Freq: Every day | ORAL | Status: DC
  Filled 2016-03-01: qty 1

## 2016-03-01 NOTE — MAU Note (Signed)
Pt reports upper/mid abd pain x3-4 days ago. Denies nausea, vomiting, diarrhea, or fever. S/p vaginal delivery 05/17

## 2016-03-01 NOTE — H&P (Signed)
Cathy Webster is an 25 y.o. female. [id number] Patient is 25 y.o. [id number] admitted with contraction, hx of C/s and two VBAC's Delivered 5/17, discharged with borderline hypertension postpartum day 2 who presented with epigastric pain and headache for 4 days. Delivery Note At 11:57 AM a viable female was delivered via, VBAC, Spontaneous (Presentation: Occiput Anterior). APGAR: 8, 9; weight 6 lb 8.4 oz (2960 g).  Placenta status: Intact, Spontaneous. Cord: 3 vessels with the following complications: None.   Anesthesia: Epidural  Episiotomy: None Lacerations: None Est. Blood Loss (mL): 200 Uterus: firm  Patient was GBS positive which was adequately treated with PCN.  Mom to postpartum. Baby to Couplet care / Skin to Skin.  Cathy Webster 02/22/2016, 1:38 PM  Pertinent Gynecological History:  Menstrual History: Menarche age: Patient's last menstrual period was 05/12/2015 (exact date).    Past Medical History  Diagnosis Date  . Abnormal Pap smear 2008    Past Surgical History  Procedure Laterality Date  . Cesarean section    . Tonsillectomy      Family History  Problem Relation Age of Onset  . Diabetes Mother   . Hypertension Mother   . Asthma Brother     Social History:  reports that she has never smoked. She has never used smokeless tobacco. She reports that she does not drink alcohol or use illicit drugs.  Allergies: No Known Allergies  Prescriptions prior to admission  Medication Sig Dispense Refill Last Dose  . ibuprofen (ADVIL,MOTRIN) 600 MG tablet Take 1 tablet (600 mg total) by mouth every 6 (six) hours as needed. (Patient taking differently: Take 600 mg by mouth every 6 (six) hours as needed for moderate pain. ) 30 tablet 0 03/01/2016 at Unknown time  . Prenatal Multivit-Min-Fe-FA (PRENATAL VITAMINS) 0.8 MG tablet Take 1 tablet by mouth daily. (Patient not taking: Reported on 02/21/2016) 30 tablet 12 Not Taking at Unknown time    Review of Systems   Respiratory: Negative.   Gastrointestinal: Positive for abdominal pain (epigastic).  Musculoskeletal: Negative.   Neurological: Positive for headaches (resolved with acetaminophen).    Blood pressure 147/62, pulse 65, temperature 98.1 F (36.7 C), temperature source Oral, resp. rate 20, height 5\' 6"  (1.676 m), weight 127.914 kg (282 lb), last menstrual period 05/12/2015, SpO2 100 %, unknown if currently breastfeeding. Physical Exam  Vitals reviewed. Constitutional: She is oriented to person, place, and time. She appears well-developed. No distress.  Cardiovascular: Normal rate.   Respiratory: Effort normal. No respiratory distress.  GI: Soft. She exhibits no mass. There is no tenderness.  Musculoskeletal: She exhibits edema (2+ lower extremity).  Neurological: She is alert and oriented to person, place, and time. She displays abnormal reflex (clonus).  Skin: Skin is warm and dry.  Psychiatric: She has a normal mood and affect. Her behavior is normal.    Results for orders placed or performed during the hospital encounter of 03/01/16 (from the past 24 hour(s))  Urinalysis, Routine w reflex microscopic (not at [facility])     Status: Abnormal   Collection Time: 03/01/16  7:45 PM  Result Value Ref Range   Color, Urine YELLOW YELLOW   APPearance CLEAR CLEAR   Specific Gravity, Urine 1.010 1.005 - 1.030   pH 6.0 5.0 - 8.0   Glucose, UA NEGATIVE NEGATIVE mg/dL   Hgb urine dipstick LARGE (A) NEGATIVE   Bilirubin Urine NEGATIVE NEGATIVE   Ketones, ur NEGATIVE NEGATIVE mg/dL   Protein, ur NEGATIVE NEGATIVE mg/dL   Nitrite NEGATIVE NEGATIVE  Leukocytes, UA SMALL (A) NEGATIVE  Protein / creatinine ratio, urine     Status: None   Collection Time: 03/01/16  7:45 PM  Result Value Ref Range   Creatinine, Urine 70.00 mg/dL   Total Protein, Urine 9 mg/dL   Protein Creatinine Ratio 0.13 0.00 - 0.15 mg/mg[Cre]  Urine microscopic-add on     Status: Abnormal   Collection Time: 03/01/16  7:45 PM   Result Value Ref Range   Squamous Epithelial / LPF 0-5 (A) NONE SEEN   WBC, UA 0-5 0 - 5 WBC/hpf   RBC / HPF 6-30 0 - 5 RBC/hpf   Bacteria, UA FEW (A) NONE SEEN  Comprehensive metabolic panel     Status: Abnormal   Collection Time: 03/01/16  8:21 PM  Result Value Ref Range   Sodium 140 135 - 145 mmol/L   Potassium 3.6 3.5 - 5.1 mmol/L   Chloride 107 101 - 111 mmol/L   CO2 25 22 - 32 mmol/L   Glucose, Bld 83 65 - 99 mg/dL   BUN 14 6 - 20 mg/dL   Creatinine, Ser 4.54 0.44 - 1.00 mg/dL   Calcium 8.7 (L) 8.9 - 10.3 mg/dL   Total Protein 7.0 6.5 - 8.1 g/dL   Albumin 3.2 (L) 3.5 - 5.0 g/dL   AST 098 (H) 15 - 41 U/L   ALT 203 (H) 14 - 54 U/L   Alkaline Phosphatase 85 38 - 126 U/L   Total Bilirubin 0.3 0.3 - 1.2 mg/dL   GFR calc non Af Amer >60 >60 mL/min   GFR calc Af Amer >60 >60 mL/min   Anion gap 8 5 - 15  CBC     Status: Abnormal   Collection Time: 03/01/16  8:21 PM  Result Value Ref Range   WBC 7.5 4.0 - 10.5 K/uL   RBC 4.23 3.87 - 5.11 MIL/uL   Hemoglobin 11.2 (L) 12.0 - 15.0 g/dL   HCT 11.9 (L) 14.7 - 82.9 %   MCV 79.7 78.0 - 100.0 fL   MCH 26.5 26.0 - 34.0 pg   MCHC 33.2 30.0 - 36.0 g/dL   RDW 56.2 13.0 - 86.5 %   Platelets 178 150 - 400 K/uL    No results found.  Assessment/Plan: 8 days postpartum with elevated blood pressures responded to apresoline and labetalol Elevated LFT c/w postpartum preeclampsia severe features Plan admission for observation for Magnesium sulfate IV and antihypertensives  Cathy Webster 03/01/2016, 11:13 PM

## 2016-03-02 ENCOUNTER — Encounter (HOSPITAL_COMMUNITY): Payer: Self-pay

## 2016-03-02 LAB — COMPREHENSIVE METABOLIC PANEL
ALK PHOS: 80 U/L (ref 38–126)
ALT: 170 U/L — ABNORMAL HIGH (ref 14–54)
ANION GAP: 6 (ref 5–15)
AST: 85 U/L — ABNORMAL HIGH (ref 15–41)
Albumin: 2.9 g/dL — ABNORMAL LOW (ref 3.5–5.0)
BILIRUBIN TOTAL: 0.3 mg/dL (ref 0.3–1.2)
BUN: 10 mg/dL (ref 6–20)
CALCIUM: 7.7 mg/dL — AB (ref 8.9–10.3)
CO2: 26 mmol/L (ref 22–32)
Chloride: 107 mmol/L (ref 101–111)
Creatinine, Ser: 0.57 mg/dL (ref 0.44–1.00)
Glucose, Bld: 109 mg/dL — ABNORMAL HIGH (ref 65–99)
Potassium: 3.4 mmol/L — ABNORMAL LOW (ref 3.5–5.1)
SODIUM: 139 mmol/L (ref 135–145)
TOTAL PROTEIN: 6.5 g/dL (ref 6.5–8.1)

## 2016-03-02 MED ORDER — IBUPROFEN 600 MG PO TABS
600.0000 mg | ORAL_TABLET | Freq: Once | ORAL | Status: AC
  Administered 2016-03-02: 600 mg via ORAL
  Filled 2016-03-02: qty 1

## 2016-03-02 MED ORDER — IBUPROFEN 600 MG PO TABS: 600.0000 mg | ORAL_TABLET | Freq: Four times a day (QID) | ORAL | Status: DC | PRN

## 2016-03-02 MED ORDER — OXYCODONE-ACETAMINOPHEN 5-325 MG PO TABS
2.0000 | ORAL_TABLET | ORAL | Status: DC | PRN
  Administered 2016-03-02 (×4): 2 via ORAL
  Filled 2016-03-02 (×4): qty 2

## 2016-03-02 MED ORDER — BUTALBITAL-APAP-CAFFEINE 50-325-40 MG PO TABS
2.0000 | ORAL_TABLET | Freq: Four times a day (QID) | ORAL | Status: DC | PRN
  Administered 2016-03-02 (×2): 2 via ORAL
  Filled 2016-03-02 (×2): qty 2

## 2016-03-02 MED ORDER — CYCLOBENZAPRINE HCL 10 MG PO TABS
10.0000 mg | ORAL_TABLET | Freq: Three times a day (TID) | ORAL | Status: DC | PRN
  Administered 2016-03-02: 10 mg via ORAL
  Filled 2016-03-02 (×2): qty 1

## 2016-03-02 NOTE — Progress Notes (Signed)
Pt transferred to rm [id number], via wheelchair

## 2016-03-02 NOTE — Progress Notes (Signed)
Post Partum Day 9 Subjective: headache not well controlled with Percocet  Objective: Blood pressure 119/83, pulse 76, temperature 98.1 F (36.7 C), temperature source Axillary, resp. rate 16, height 5\' 6"  (1.676 m), weight 127.914 kg (282 lb), SpO2 100 %, unknown if currently breastfeeding.  Physical Exam:  General: alert and cooperative Lochia: appropriate  DVT Evaluation: No evidence of DVT seen on physical exam.   Recent Labs  03/01/16 2021  HGB 11.2*  HCT 33.7*   CMP Latest Ref Rng 03/02/2016 03/01/2016 02/21/2016  Glucose 65 - 99 mg/dL 960(A109(H) 83 78  BUN 6 - 20 mg/dL 10 14 <5(W<6(L)  Creatinine 0.44 - 1.00 mg/dL 0.980.57 1.190.67 1.470.48  Sodium 135 - 145 mmol/L 139 140 137  Potassium 3.5 - 5.1 mmol/L 3.4(L) 3.6 3.4(L)  Chloride 101 - 111 mmol/L 107 107 106  CO2 22 - 32 mmol/L 26 25 24   Calcium 8.9 - 10.3 mg/dL 7.7(L) 8.7(L) 8.6(L)  Total Protein 6.5 - 8.1 g/dL 6.5 7.0 7.1  Total Bilirubin 0.3 - 1.2 mg/dL 0.3 0.3 8.2(N0.2(L)  Alkaline Phos 38 - 126 U/L 80 85 80  AST 15 - 41 U/L 85(H) 121(H) 22  ALT 14 - 54 U/L 170(H) 203(H) 19   CBC    Component Value Date/Time   WBC 7.5 03/01/2016 2021   RBC 4.23 03/01/2016 2021   HGB 11.2* 03/01/2016 2021   HCT 33.7* 03/01/2016 2021   PLT 178 03/01/2016 2021   MCV 79.7 03/01/2016 2021   MCH 26.5 03/01/2016 2021   MCHC 33.2 03/01/2016 2021   RDW 14.2 03/01/2016 2021   LYMPHSABS 2.1 08/10/2015 1343   MONOABS 0.4 08/10/2015 1343   EOSABS 0.1 08/10/2015 1343   BASOSABS 0.0 08/10/2015 1343       Assessment/Plan: Postpartum preeclampsia Magnesium for 24 hr, antihypertensive Fioricet for headache   LOS: 1 day   Cathy Webster 03/02/2016, 7:57 AM

## 2016-03-03 DIAGNOSIS — O1495 Unspecified pre-eclampsia, complicating the puerperium: Secondary | ICD-10-CM

## 2016-03-03 MED ORDER — HYDROCHLOROTHIAZIDE 12.5 MG PO CAPS: 25.0000 mg | ORAL_CAPSULE | Freq: Every day | ORAL | Status: DC

## 2016-03-03 NOTE — Discharge Instructions (Signed)

## 2016-03-03 NOTE — Discharge Summary (Signed)
OB Discharge Summary     Patient Name: Cathy Webster DOB: 10-30-1990 MRN: [id number]  Date of admission: 03/01/2016 Delivering MD: This patient has no babies on file.  Date of discharge: 03/03/2016  Admitting diagnosis: PP ABD PAIN Intrauterine pregnancy: Unknown     Secondary diagnosis:  Active Problems:   Preeclampsia in postpartum period  Additional problems: none     Discharge diagnosis: same                                                                                               Hospital course:  This is a 25 yo G4P4 who is 9 day PP who was admitted for preeclampsia with severe features and elevated LFTs.  She was started on Magnesium, which completed last night.  She was also started on Lisinopril and HCTZ for antihypertensives.  This morning, her BP is controlled.  Will d/c on HCTZ.  F/u 1 week in office for BP check.  Physical exam  Filed Vitals:   03/02/16 2300 03/03/16 0000 03/03/16 0114 03/03/16 0552  BP: 130/76 132/68 127/81 119/93  Pulse: 78 66 71 66  Temp:   98.9 F (37.2 C) 98.8 F (37.1 C)  TempSrc:   Oral Oral  Resp: Height:      Weight:    124.286 kg (274 lb)  SpO2: 100% 100% 100% 100%   General: alert, cooperative and no distress Lochia: appropriate Abd: soft, mild periumbilical tenderness, no rebound, no guarding. Uterine Fundus: firm  DVT Evaluation: No evidence of DVT seen on physical exam. Negative Homan's sign. No cords or calf tenderness. No significant calf/ankle edema. Labs: Lab Results  Component Value Date   WBC 7.5 03/01/2016   HGB 11.2* 03/01/2016   HCT 33.7* 03/01/2016   MCV 79.7 03/01/2016   PLT 178 03/01/2016   CMP Latest Ref Rng 03/02/2016  Glucose 65 - 99 mg/dL 409(W)  BUN 6 - 20 mg/dL 10  Creatinine 1.19 - 1.47 mg/dL 8.29  Sodium 562 - 130 mmol/L 139  Potassium 3.5 - 5.1 mmol/L 3.4(L)  Chloride 101 - 111 mmol/L 107  CO2 22 - 32 mmol/L 26  Calcium 8.9 - 10.3 mg/dL 7.7(L)  Total Protein 6.5 - 8.1  g/dL 6.5  Total Bilirubin 0.3 - 1.2 mg/dL 0.3  Alkaline Phos 38 - 126 U/L 80  AST 15 - 41 U/L 85(H)  ALT 14 - 54 U/L 170(H)    Discharge instruction: per After Visit Summary and "Baby and Me Booklet".  After visit meds:    Medication List    TAKE these medications        hydrochlorothiazide 12.5 MG capsule  Commonly known as:  MICROZIDE  Take 2 capsules (25 mg total) by mouth daily.     ibuprofen 600 MG tablet  Commonly known as:  ADVIL,MOTRIN  Take 1 tablet (600 mg total) by mouth every 6 (six) hours as needed.        Diet: routine diet  Activity: Advance as tolerated. Pelvic rest for 6 weeks.   Outpatient follow up: 1 week for BP check. Follow  up Appt:No future appointments. Follow up Visit:No Follow-up on file.    03/03/2016 Tayshaun Kroh JEHIEL, DO

## 2016-03-21 ENCOUNTER — Telehealth: Payer: Self-pay

## 2016-03-22 NOTE — Telephone Encounter (Signed)
Pt called and stated that she needs a note stating that she was taken out of work from 01/23/16.  Pt stated that she was told by the provider who wrote her Percocet that she could be out of work due to her sciatic pain.  Pt also stated that she spoke with a nurse and they informed her that she did not need a note because her short term disability would cover it.  Pt stated that now because she did not get that note her job is now requesting it.  I explained to the patient that I did not see in the providers note that she was taken out of work nor did I see a letter that was produced for her to be taken out of work either.  I informed pt that I would speak with the provider and get back with her.  Notified Vonzella NippleJulie Wenzel, PA and was informed that patient was not taken out of work.  Contacted the patient and informed pt that the provider informed me that she did not take her out of work due to sciatic pain.  I advised pt that she can request FMLA as long as she qualifies and that we can fill it out for intermittent leave for her prenatal visits, lab visits, US visits, and ER visits.  I explained to the patient that it is up to her to speak with her HR and let them know she wanted to be out of work not due to medical recommendation.  Pt stated understanding with no further questions.

## 2016-04-07 ENCOUNTER — Inpatient Hospital Stay (HOSPITAL_COMMUNITY)
Admission: AD | Admit: 2016-04-07 | Discharge: 2016-04-07 | Disposition: A | Discharge status: Home / Self Care | Payer: BC Managed Care – PPO | Source: Ambulatory Visit | Attending: Family Medicine | Admitting: Family Medicine

## 2016-04-07 ENCOUNTER — Encounter (HOSPITAL_COMMUNITY): Payer: Self-pay

## 2016-04-07 DIAGNOSIS — L509 Urticaria, unspecified: Secondary | ICD-10-CM | POA: Diagnosis present

## 2016-04-07 DIAGNOSIS — I10 Essential (primary) hypertension: Secondary | ICD-10-CM | POA: Diagnosis not present

## 2016-04-07 LAB — URINE MICROSCOPIC-ADD ON
Bacteria, UA: NONE SEEN
RBC / HPF: NONE SEEN RBC/hpf (ref 0–5)

## 2016-04-07 LAB — URINALYSIS, ROUTINE W REFLEX MICROSCOPIC
BILIRUBIN URINE: NEGATIVE
Glucose, UA: NEGATIVE mg/dL
Ketones, ur: NEGATIVE mg/dL
NITRITE: NEGATIVE
PH: 5.5 (ref 5.0–8.0)
Protein, ur: NEGATIVE mg/dL

## 2016-04-07 MED ORDER — DEXAMETHASONE SODIUM PHOSPHATE 10 MG/ML IJ SOLN
10.0000 mg | Freq: Once | INTRAMUSCULAR | Status: AC
  Administered 2016-04-07: 10 mg via INTRAMUSCULAR
  Filled 2016-04-07: qty 1

## 2016-04-07 MED ORDER — LORATADINE 10 MG PO TABS: 10.0000 mg | ORAL_TABLET | Freq: Every day | ORAL | Status: DC

## 2016-04-07 MED ORDER — LORATADINE 10 MG PO TABS
10.0000 mg | ORAL_TABLET | Freq: Every day | ORAL | Status: DC
  Administered 2016-04-07: 10 mg via ORAL
  Filled 2016-04-07 (×2): qty 1

## 2016-04-07 NOTE — Discharge Instructions (Signed)
Hives Hives are itchy, red, swollen areas of the skin. They can vary in size and location on your body. Hives can come and go for hours or several days (acute hives) or for several weeks (chronic hives). Hives do not spread from person to person (noncontagious). They may get worse with scratching, exercise, and emotional stress. CAUSES   Allergic reaction to food, additives, or drugs.  Infections, including the common cold.  Illness, such as vasculitis, lupus, or thyroid disease.  Exposure to sunlight, heat, or cold.  Exercise.  Stress.  Contact with chemicals. SYMPTOMS   Red or white swollen patches on the skin. The patches may change size, shape, and location quickly and repeatedly.  Itching.  Swelling of the hands, feet, and face. This may occur if hives develop deeper in the skin. DIAGNOSIS  Your caregiver can usually tell what is wrong by performing a physical exam. Skin or blood tests may also be done to determine the cause of your hives. In some cases, the cause cannot be determined. TREATMENT  Mild cases usually get better with medicines such as antihistamines. Severe cases may require an emergency epinephrine injection. If the cause of your hives is known, treatment includes avoiding that trigger.  HOME CARE INSTRUCTIONS   Avoid causes that trigger your hives.  Take antihistamines as directed by your caregiver to reduce the severity of your hives. Non-sedating or low-sedating antihistamines are usually recommended. Do not drive while taking an antihistamine.  Take any other medicines prescribed for itching as directed by your caregiver.  Wear loose-fitting clothing.  Keep all follow-up appointments as directed by your caregiver. SEEK MEDICAL CARE IF:   You have persistent or severe itching that is not relieved with medicine.  You have painful or swollen joints. SEEK IMMEDIATE MEDICAL CARE IF:   You have a fever.  Your tongue or lips are swollen.  You have  trouble breathing or swallowing.  You feel tightness in the throat or chest.  You have abdominal pain. These problems may be the first sign of a life-threatening allergic reaction. Call your local emergency services (911 in [place] MAKE SURE YOU:   Understand these instructions.  Will watch your condition.  Will get help right away if you are not doing well or get worse.   This information is not intended to replace advice given to you by your health care provider. Make sure you discuss any questions you have with your health care provider.   Document Released: 09/24/2005 Document Revised: 09/29/2013 Document Reviewed: 12/18/2011 Elsevier Interactive Patient Education 2016 [employer]  

## 2016-04-07 NOTE — MAU Note (Signed)
woke up this morning, hives noted - started as a few stops, thought it was bug bites. Took a shower, got worse- is allover now.   Is 5+ wks post partem.  Felt like a tickle in throat, now is itching. No SOB

## 2016-04-07 NOTE — MAU Provider Note (Signed)
Chief Complaint:  Urticaria   First Provider Initiated Contact with Patient 04/07/16 1139     Cathy Webster is a 25 y.o. [id number] who presents to maternity admissions reporting hives with itching.  Started this morning.  Have gotten worse and more numerous throughout the day. Cannot think of any new exposures or allergies. She reports vaginal bleeding, vaginal itching/burning, urinary symptoms, h/a, dizziness, n/v, or fever/chills.    RN Note: woke up this morning, hives noted - started as a few stops, thought it was bug bites. Took a shower, got worse- is allover now. Is 5+ wks post partem. Felt like a tickle in throat, now is itching. No SOB       Rash This is a new problem. The current episode started today. The problem has been gradually worsening since onset. The affected locations include the chest, torso, back, abdomen, left arm, left upper leg, right arm and right upper leg. The rash is characterized by swelling, redness and itchiness. She was exposed to nothing. Pertinent negatives include no anorexia, cough, diarrhea, facial edema, fatigue, fever, joint pain or shortness of breath. Treatments tried: warm shower. The treatment provided no relief. There is no history of allergies, asthma or eczema.   Past Medical History: Past Medical History  Diagnosis Date  . Abnormal Pap smear 2008  . Hypertension     Past obstetric history: OB History  Gravida Para Term Preterm AB SAB TAB Ectopic Multiple Living  0 4    # Outcome Date GA Lbr Len/2nd Weight Sex Delivery Anes PTL Lv  4 Term 02/22/16 [redacted]w[redacted]d 23:48 / 00:09 6 lb 8.4 oz (2.96 kg) F VBAC EPI  Y  3 Term 06/09/14 [redacted]w[redacted]d 09:38 / 00:13 7 lb 7.6 oz (3.39 kg) M VBAC EPI  Y  2 Term 03/24/13 [redacted]w[redacted]d 06:25 / 00:13 6 lb 10.9 oz (3.03 kg) F VBAC EPI  Y  1 Term 08/07/08 [redacted]w[redacted]d  6 lb 14 oz (3.118 kg) F CS-Unspec EPI  Y      Past Surgical History: Past Surgical History  Procedure Laterality Date  . Cesarean section    .  Tonsillectomy      Family History: Family History  Problem Relation Age of Onset  . Diabetes Mother   . Hypertension Mother   . Asthma Brother     Social History: Social History  Substance Use Topics  . Smoking status: Never Smoker   . Smokeless tobacco: Never Used  . Alcohol Use: No    Allergies: No Known Allergies  Meds:  Prescriptions prior to admission  Medication Sig Dispense Refill Last Dose  . hydrochlorothiazide (MICROZIDE) 12.5 MG capsule Take 2 capsules (25 mg total) by mouth daily. (Patient not taking: Reported on 04/07/2016) 30 capsule 3   . ibuprofen (ADVIL,MOTRIN) 600 MG tablet Take 1 tablet (600 mg total) by mouth every 6 (six) hours as needed. (Patient not taking: Reported on 04/07/2016) 30 tablet 0 03/01/2016 at 1400    I have reviewed patient's Past Medical Hx, Surgical Hx, Family Hx, Social Hx, medications and allergies.  ROS:  Review of Systems  Constitutional: Negative for fever, chills and fatigue.  Respiratory: Negative for cough and shortness of breath.   Gastrointestinal: Negative for diarrhea and anorexia.  Genitourinary: Negative for dysuria, vaginal bleeding and pelvic pain.  Musculoskeletal: Negative for joint pain.  Skin: Positive for rash.       Itching    Other systems negative  Physical Exam  Patient Vitals for the past 24 hrs:  BP Temp Temp src Pulse Resp SpO2 Height Weight  04/07/16 1133 - - - - - - 5\' 4"  (1.626 m) 260 lb (117.935 kg)  04/07/16 1112 137/83 mmHg 98.2 F (36.8 C) Oral 95 18 99 % - -   Constitutional: Well-developed, well-nourished female in no acute distress, but uncomfortable with itching.  Cardiovascular: normal rate and rhythm, no ectopy audible, S1 & S2 heard, no murmur Respiratory: normal effort, no distress. Lungs CTAB with no wheezes or crackles GI: Abd soft, non-tender.  Nondistended.  No rebound, No guarding.  Bowel Sounds audible  MS: Extremities nontender, no edema, normal ROM Neurologic: Alert and  oriented x 4.   Grossly nonfocal. GU: Neg CVAT. Skin:  Warm and Dry Psych:  Affect appropriate.  SKIN:   Multiple raised welts on body, chest, back, arms, legs             Some are small, 1-2cm, some as large as 4cm.               Consistent with hives   Labs: No results found for this or any previous visit (from the past 24 hour(s)). --/--/O POS (05/16 1551) Results for orders placed or performed during the hospital encounter of 04/07/16 (from the past 24 hour(s))  Urinalysis, Routine w reflex microscopic ([facility])     Status: Abnormal   Collection Time: 04/07/16 11:15 AM  Result Value Ref Range   Color, Urine YELLOW YELLOW   APPearance CLEAR CLEAR   Specific Gravity, Urine <1.005 (L) 1.005 - 1.030   pH 5.5 5.0 - 8.0   Glucose, UA NEGATIVE NEGATIVE mg/dL   Hgb urine dipstick LARGE (A) NEGATIVE   Bilirubin Urine NEGATIVE NEGATIVE   Ketones, ur NEGATIVE NEGATIVE mg/dL   Protein, ur NEGATIVE NEGATIVE mg/dL   Nitrite NEGATIVE NEGATIVE   Leukocytes, UA LARGE (A) NEGATIVE  Urine microscopic-add on     Status: Abnormal   Collection Time: 04/07/16 11:15 AM  Result Value Ref Range   Squamous Epithelial / LPF TOO NUMEROUS TO COUNT (A) NONE SEEN   WBC, UA 6-30 0 - 5 WBC/hpf   RBC / HPF NONE SEEN 0 - 5 RBC/hpf   Bacteria, UA NONE SEEN NONE SEEN   Urine-Other MUCOUS PRESENT     Imaging:  No results found.  MAU Course/MDM: I have ordered labs as follows:  UA Imaging ordered: None Results reviewed.     Treatments in MAU included Claritin and Decadron 10mg  IM x 1.   >>  States itching is almost gone after meds Pt stable at time of discharge.  Assessment: New Onset Urticaria/Hives with Pruritis Unknown source No obvious sign of infection  Plan: Discharge home Recommend Cool compresses to hives, avoid scratching  Rx sent for Claritin for itching May use OTC cortisone to lesions May use Benadryl instead of Claritin for sleep if necessary   Follow-up Information     Follow up with ARNOLD,JAMES, MD. Go in 2 days.   Specialty:  Obstetrics and Gynecology   Why:  04/09/16 at 2:40p, for PostPartum visit and recheck of Hives   Contact information:   [address] [place] [id number] [phone]        Medication List    ASK your doctor about these medications        hydrochlorothiazide 12.5 MG capsule  Commonly known as:  MICROZIDE  Take 2 capsules (25 mg total) by mouth  daily.     ibuprofen 600 MG tablet  Commonly known as:  ADVIL,MOTRIN  Take 1 tablet (600 mg total) by mouth every 6 (six) hours as needed.       Encouraged to return here or to other Urgent Care/ED if she develops worsening of symptoms, increase in pain, fever, or other concerning symptoms.   Wynelle BourgeoisMarie Brailynn Breth CNM, MSN Certified Nurse-Midwife 04/07/2016 11:51 AM

## 2016-04-07 NOTE — MAU Note (Signed)
Urine in lab 

## 2016-04-09 ENCOUNTER — Emergency Department
Admission: EM | Admit: 2016-04-09 | Discharge: 2016-04-09 | Disposition: A | Discharge status: Home / Self Care | Payer: BC Managed Care – PPO | Attending: Emergency Medicine | Admitting: Emergency Medicine

## 2016-04-09 ENCOUNTER — Encounter: Payer: Self-pay | Admitting: Obstetrics & Gynecology

## 2016-04-09 ENCOUNTER — Emergency Department: Payer: BC Managed Care – PPO

## 2016-04-09 ENCOUNTER — Ambulatory Visit (INDEPENDENT_AMBULATORY_CARE_PROVIDER_SITE_OTHER): Payer: BC Managed Care – PPO | Admitting: Obstetrics and Gynecology

## 2016-04-09 DIAGNOSIS — T782XXA Anaphylactic shock, unspecified, initial encounter: Secondary | ICD-10-CM

## 2016-04-09 DIAGNOSIS — Z30014 Encounter for initial prescription of intrauterine contraceptive device: Secondary | ICD-10-CM | POA: Diagnosis not present

## 2016-04-09 DIAGNOSIS — N3090 Cystitis, unspecified without hematuria: Secondary | ICD-10-CM | POA: Diagnosis not present

## 2016-04-09 DIAGNOSIS — R221 Localized swelling, mass and lump, neck: Secondary | ICD-10-CM | POA: Diagnosis present

## 2016-04-09 DIAGNOSIS — Z3202 Encounter for pregnancy test, result negative: Secondary | ICD-10-CM | POA: Diagnosis not present

## 2016-04-09 DIAGNOSIS — Z309 Encounter for contraceptive management, unspecified: Secondary | ICD-10-CM | POA: Insufficient documentation

## 2016-04-09 DIAGNOSIS — I10 Essential (primary) hypertension: Secondary | ICD-10-CM | POA: Diagnosis not present

## 2016-04-09 HISTORY — DX: Cystitis, unspecified without hematuria: N30.90

## 2016-04-09 LAB — CBC WITH DIFFERENTIAL/PLATELET
BASOS ABS: 0 10*3/uL (ref 0–0.1)
BASOS PCT: 0 %
Eosinophils Absolute: 0 10*3/uL (ref 0–0.7)
Eosinophils Relative: 0 %
HEMATOCRIT: 38.3 % (ref 35.0–47.0)
HEMOGLOBIN: 12.8 g/dL (ref 12.0–16.0)
Lymphocytes Relative: 23 %
Lymphs Abs: 2.2 10*3/uL (ref 1.0–3.6)
MCH: 26.8 pg (ref 26.0–34.0)
MCHC: 33.4 g/dL (ref 32.0–36.0)
MCV: 80.1 fL (ref 80.0–100.0)
Monocytes Absolute: 0.3 10*3/uL (ref 0.2–0.9)
Monocytes Relative: 3 %
NEUTROS ABS: 6.9 10*3/uL — AB (ref 1.4–6.5)
NEUTROS PCT: 74 %
Platelets: 189 10*3/uL (ref 150–440)
RBC: 4.78 MIL/uL (ref 3.80–5.20)
RDW: 16.3 % — AB (ref 11.5–14.5)
WBC: 9.5 10*3/uL (ref 3.6–11.0)

## 2016-04-09 LAB — URINALYSIS COMPLETE WITH MICROSCOPIC (ARMC ONLY)
Bilirubin Urine: NEGATIVE
Glucose, UA: NEGATIVE mg/dL
KETONES UR: NEGATIVE mg/dL
NITRITE: NEGATIVE
PROTEIN: 30 mg/dL — AB
SPECIFIC GRAVITY, URINE: 1.029 (ref 1.005–1.030)
pH: 5 (ref 5.0–8.0)

## 2016-04-09 LAB — COMPREHENSIVE METABOLIC PANEL
ALK PHOS: 60 U/L (ref 38–126)
ALT: 18 U/L (ref 14–54)
ANION GAP: 7 (ref 5–15)
AST: 22 U/L (ref 15–41)
Albumin: 3.7 g/dL (ref 3.5–5.0)
BILIRUBIN TOTAL: 0.7 mg/dL (ref 0.3–1.2)
BUN: 13 mg/dL (ref 6–20)
CALCIUM: 8.2 mg/dL — AB (ref 8.9–10.3)
CO2: 23 mmol/L (ref 22–32)
Chloride: 108 mmol/L (ref 101–111)
Creatinine, Ser: 0.68 mg/dL (ref 0.44–1.00)
Glucose, Bld: 110 mg/dL — ABNORMAL HIGH (ref 65–99)
POTASSIUM: 3.4 mmol/L — AB (ref 3.5–5.1)
Sodium: 138 mmol/L (ref 135–145)
TOTAL PROTEIN: 6.8 g/dL (ref 6.5–8.1)

## 2016-04-09 LAB — POCT PREGNANCY, URINE
PREG TEST UR: NEGATIVE
PREG TEST UR: NEGATIVE

## 2016-04-09 LAB — LIPASE, BLOOD: Lipase: 20 U/L (ref 11–51)

## 2016-04-09 MED ORDER — SODIUM CHLORIDE 0.9 % IV BOLUS (SEPSIS)
1000.0000 mL | Freq: Once | INTRAVENOUS | Status: AC
  Administered 2016-04-09: 1000 mL via INTRAVENOUS

## 2016-04-09 MED ORDER — EPINEPHRINE 0.3 MG/0.3ML IJ SOAJ
0.3000 mg | Freq: Once | INTRAMUSCULAR | Status: AC
  Administered 2016-04-09: 0.3 mg via INTRAMUSCULAR
  Filled 2016-04-09: qty 0.3

## 2016-04-09 MED ORDER — DIPHENHYDRAMINE HCL 25 MG PO CAPS: 50.0000 mg | ORAL_CAPSULE | Freq: Four times a day (QID) | ORAL | Status: DC | PRN

## 2016-04-09 MED ORDER — LEVONORGESTREL 18.6 MCG/DAY IU IUD
INTRAUTERINE_SYSTEM | Freq: Once | INTRAUTERINE | Status: AC
  Administered 2016-04-09: 1 via INTRAUTERINE

## 2016-04-09 MED ORDER — FAMOTIDINE 20 MG PO TABS
20.0000 mg | ORAL_TABLET | Freq: Two times a day (BID) | ORAL | Status: DC
Start: 2016-04-09 — End: 2016-11-05

## 2016-04-09 MED ORDER — DEXAMETHASONE SODIUM PHOSPHATE 10 MG/ML IJ SOLN
10.0000 mg | Freq: Once | INTRAMUSCULAR | Status: AC
  Administered 2016-04-09: 10 mg via INTRAVENOUS
  Filled 2016-04-09: qty 1

## 2016-04-09 MED ORDER — FAMOTIDINE IN NACL 20-0.9 MG/50ML-% IV SOLN
20.0000 mg | Freq: Once | INTRAVENOUS | Status: AC
  Administered 2016-04-09: 20 mg via INTRAVENOUS
  Filled 2016-04-09: qty 50

## 2016-04-09 MED ORDER — NITROFURANTOIN MACROCRYSTAL 100 MG PO CAPS: 100.0000 mg | ORAL_CAPSULE | Freq: Two times a day (BID) | ORAL | Status: DC

## 2016-04-09 MED ORDER — DIPHENHYDRAMINE HCL 50 MG/ML IJ SOLN
50.0000 mg | Freq: Once | INTRAMUSCULAR | Status: AC
  Administered 2016-04-09: 50 mg via INTRAVENOUS
  Filled 2016-04-09: qty 1

## 2016-04-09 MED ORDER — PREDNISONE 20 MG PO TABS: 40.0000 mg | ORAL_TABLET | Freq: Every day | ORAL | Status: DC

## 2016-04-09 MED ORDER — ONDANSETRON HCL 4 MG/2ML IJ SOLN
INTRAMUSCULAR | Status: AC
  Filled 2016-04-09: qty 2

## 2016-04-09 MED ORDER — ONDANSETRON HCL 4 MG/2ML IJ SOLN
4.0000 mg | Freq: Once | INTRAMUSCULAR | Status: AC
  Administered 2016-04-09: 4 mg via INTRAVENOUS

## 2016-04-09 MED ORDER — EPINEPHRINE 0.3 MG/0.3ML IJ SOAJ: 0.3000 mg | Freq: Once | INTRAMUSCULAR | Status: DC

## 2016-04-09 MED ORDER — LEVONORGESTREL 18.6 MCG/DAY IU IUD: 1.0000 | INTRAUTERINE_SYSTEM | Freq: Once | INTRAUTERINE | Status: DC

## 2016-04-09 NOTE — ED Notes (Signed)
Pt to discharge to home   snack provided

## 2016-04-09 NOTE — Discharge Instructions (Signed)
Anaphylactic Reaction °An anaphylactic reaction is a sudden, severe allergic reaction that involves the whole body. It can be life threatening. A hospital stay is often required. People with asthma, eczema, or hay fever are slightly more likely to have an anaphylactic reaction. °CAUSES  °An anaphylactic reaction may be caused by anything to which you are allergic. After being exposed to the allergic substance, your immune system becomes sensitized to it. When you are exposed to that allergic substance again, an allergic reaction can occur. Common causes of an anaphylactic reaction include: °· Medicines. °· Foods, especially peanuts, wheat, shellfish, milk, and eggs. °· Insect bites or stings. °· Blood products. °· Chemicals, such as dyes, latex, and contrast material used for imaging tests. °SYMPTOMS  °When an allergic reaction occurs, the body releases histamine and other substances. These substances cause symptoms such as tightening of the airway. Symptoms often develop within seconds or minutes of exposure. Symptoms may include: °· Skin rash or hives. °· Itching. °· Chest tightness. °· Swelling of the eyes, tongue, or lips. °· Trouble breathing or swallowing. °· Lightheadedness or fainting. °· Anxiety or confusion. °· Stomach pains, vomiting, or diarrhea. °· Nasal congestion. °· A fast or irregular heartbeat (palpitations). °DIAGNOSIS  °Diagnosis is based on your history of recent exposure to allergic substances, your symptoms, and a physical exam. Your caregiver may also perform blood or urine tests to confirm the diagnosis. °TREATMENT  °Epinephrine medicine is the main treatment for an anaphylactic reaction. Other medicines that may be used for treatment include antihistamines, steroids, and albuterol. In severe cases, fluids and medicine to support blood pressure may be given through an intravenous line (IV). Even if you improve after treatment, you need to be observed to make sure your condition does not get  worse. This may require a stay in the hospital. °HOME CARE INSTRUCTIONS  °· Wear a medical alert bracelet or necklace stating your allergy. °· You and your family must learn how to use an anaphylaxis kit or give an epinephrine injection to temporarily treat an emergency allergic reaction. Always carry your epinephrine injection or anaphylaxis kit with you. This can be lifesaving if you have a severe reaction. °· Do not drive or perform tasks after treatment until the medicines used to treat your reaction have worn off, or until your caregiver says it is okay. °· If you have hives or a rash: °· Take medicines as directed by your caregiver. °· You may use an over-the-counter antihistamine (diphenhydramine) as needed. °· Apply cold compresses to the skin or take baths in cool water. Avoid hot baths or showers. °SEEK MEDICAL CARE IF:  °· You develop symptoms of an allergic reaction to a new substance. Symptoms may start right away or minutes later. °· You develop a rash, hives, or itching. °· You develop new symptoms. °SEEK IMMEDIATE MEDICAL CARE IF:  °· You have swelling of the mouth, difficulty breathing, or wheezing. °· You have a tight feeling in your chest or throat. °· You develop hives, swelling, or itching all over your body. °· You develop severe vomiting or diarrhea. °· You feel faint or pass out. °This is an emergency. Use your epinephrine injection or anaphylaxis kit as you have been instructed. Call your local emergency services (911 in [place] Even if you improve after the injection, you need to be examined at a hospital emergency department. °MAKE SURE YOU:  °· Understand these instructions. °· Will watch your condition. °· Will get help right away if you are not   doing well or get worse.   This information is not intended to replace advice given to you by your health care provider. Make sure you discuss any questions you have with your health care provider.   Document Released: 09/24/2005 Document  Revised: 09/29/2013 Document Reviewed: 04/06/2015 Elsevier Interactive Patient Education 2016 [employer]  Epinephrine Injection Epinephrine is a medicine given by injection to temporarily treat an emergency allergic reaction. It is also used to treat severe asthmatic attacks and other lung problems. The medicine helps to enlarge (dilate) the small breathing tubes of the lungs. A life-threatening, sudden allergic reaction that involves the whole body is called anaphylaxis. Because of potential side effects, epinephrine should only be used as directed by your caregiver. RISKS AND COMPLICATIONS Possible side effects of epinephrine injections include:  Chest pain.  Irregular or rapid heartbeat.  Shortness of breath.  Nausea.  Vomiting.  Abdominal pain or cramping.  Sweating.  Dizziness.  Weakness.  Headache.  Nervousness. Report all side effects to your caregiver. HOW TO GIVE AN EPINEPHRINE INJECTION Give the epinephrine injection immediately when symptoms of a severe reaction begin. Inject the medicine into the outer thigh or any available, large muscle. Your caregiver can teach you how to do this. You do not need to remove any clothing. After the injection, call your local emergency services (911 in [place] Even if you improve after the injection, you need to be examined at a hospital emergency department. Epinephrine works quickly, but it also wears off quickly. Delayed reactions can occur. A delayed reaction may be as serious and dangerous as the initial reaction. HOME CARE INSTRUCTIONS  Make sure you and your family know how to give an epinephrine injection.  Use epinephrine injections as directed by your caregiver. Do not use this medicine more often or in larger doses than prescribed.  Always carry your epinephrine injection or anaphylaxis kit with you. This can be lifesaving if you have a severe reaction.  Store the medicine in a cool, dry place. If the medicine becomes  discolored or cloudy, dispose of it properly and replace it with new medicine.  Check the expiration date on your medicine. It may be unsafe to use medicines past their expiration date.  Tell your caregiver about any other medicines you are taking. Some medicines can react badly with epinephrine.  Tell your caregiver about any medical conditions you have, such as diabetes, high blood pressure (hypertension), heart disease, irregular heartbeats, or if you are pregnant. SEEK IMMEDIATE MEDICAL CARE IF:  You have used an epinephrine injection. Call your local emergency services (911 in [place] Even if you improve after the injection, you need to be examined at a hospital emergency department to make sure your allergic reaction is under control. You will also be monitored for adverse effects from the medicine.  You have chest pain.  You have irregular or fast heartbeats.  You have shortness of breath.  You have severe headaches.  You have severe nausea, vomiting, or abdominal cramps.  You have severe pain, swelling, or redness in the area where you gave the injection.   This information is not intended to replace advice given to you by your health care provider. Make sure you discuss any questions you have with your health care provider.   Document Released: 09/21/2000 Document Revised: 12/17/2011 Document Reviewed: 04/13/2015 Elsevier Interactive Patient Education 2016 [employer] Inc[employer]  Urinary Tract Infection Urinary tract infections (UTIs) can develop anywhere along your urinary tract. Your urinary tract is your body's  drainage system for removing wastes and extra water. Your urinary tract includes two kidneys, two ureters, a bladder, and a urethra. Your kidneys are a pair of bean-shaped organs. Each kidney is about the size of your fist. They are located below your ribs, one on each side of your spine. CAUSES Infections are caused by microbes, which are microscopic organisms, including  fungi, viruses, and bacteria. These organisms are so small that they can only be seen through a microscope. Bacteria are the microbes that most commonly cause UTIs. SYMPTOMS  Symptoms of UTIs may vary by age and gender of the patient and by the location of the infection. Symptoms in young women typically include a frequent and intense urge to urinate and a painful, burning feeling in the bladder or urethra during urination. Older women and men are more likely to be tired, shaky, and weak and have muscle aches and abdominal pain. A fever may mean the infection is in your kidneys. Other symptoms of a kidney infection include pain in your back or sides below the ribs, nausea, and vomiting. DIAGNOSIS To diagnose a UTI, your caregiver will ask you about your symptoms. Your caregiver will also ask you to provide a urine sample. The urine sample will be tested for bacteria and white blood cells. White blood cells are made by your body to help fight infection. TREATMENT  Typically, UTIs can be treated with medication. Because most UTIs are caused by a bacterial infection, they usually can be treated with the use of antibiotics. The choice of antibiotic and length of treatment depend on your symptoms and the type of bacteria causing your infection. HOME CARE INSTRUCTIONS  If you were prescribed antibiotics, take them exactly as your caregiver instructs you. Finish the medication even if you feel better after you have only taken some of the medication.  Drink enough water and fluids to keep your urine clear or pale yellow.  Avoid caffeine, tea, and carbonated beverages. They tend to irritate your bladder.  Empty your bladder often. Avoid holding urine for long periods of time.  Empty your bladder before and after sexual intercourse.  After a bowel movement, women should cleanse from front to back. Use each tissue only once. SEEK MEDICAL CARE IF:   You have back pain.  You develop a fever.  Your  symptoms do not begin to resolve within 3 days. SEEK IMMEDIATE MEDICAL CARE IF:   You have severe back pain or lower abdominal pain.  You develop chills.  You have nausea or vomiting.  You have continued burning or discomfort with urination. MAKE SURE YOU:   Understand these instructions.  Will watch your condition.  Will get help right away if you are not doing well or get worse.   This information is not intended to replace advice given to you by your health care provider. Make sure you discuss any questions you have with your health care provider.   Document Released: 07/04/2005 Document Revised: 06/15/2015 Document Reviewed: 11/02/2011 Elsevier Interactive Patient Education [employer].

## 2016-04-09 NOTE — ED Provider Notes (Signed)
[facility] Emergency Department Provider Note  ____________________________________________  Time seen: 7:30 AM  I have reviewed the triage vital signs and the nursing notes.   HISTORY  Chief Complaint Urticaria    HPI Cathy Webster is a 25 y.o. female who complains of hives on bilateral arms and trunk for the past 2 days. She went to another hospital at the onset of symptoms and was given Claritin and a steroid injection. She feels like this did improve her symptoms and she continue taking Benadryl intermittently and then. However, she is been having sudden urges to have bowel movements with abdominal cramping, and when she goes to the bathroom she gets lightheaded. Last night she went to use the toilet to have a bowel movement and got lightheaded and passed out. She denies chest pain or shortness of breath. No vomiting. No new medications or exposures that she is aware of. She did recently give birth 6 weeks ago complicated by postpartum eclampsia, and now is doing well at home. Patient is not able to identify any new fabrics or devices or potential antigenic materials in her life.     Past Medical History  Diagnosis Date  . Abnormal Pap smear 2008  . Hypertension      Patient Active Problem List   Diagnosis Date Noted  . Preeclampsia in postpartum period 03/01/2016  . Transient hypertension 02/21/2016  . History of cesarean section, low transverse 03/16/2014  . Abnormal Pap smear of cervix 12/22/2013     Past Surgical History  Procedure Laterality Date  . Cesarean section    . Tonsillectomy       Current Outpatient Rx  Name  Route  Sig  Dispense  Refill  . loratadine (CLARITIN) 10 MG tablet   Oral   Take 1 tablet (10 mg total) by mouth daily.   30 tablet   1   . diphenhydrAMINE (BENADRYL) 25 mg capsule   Oral   Take 2 capsules (50 mg total) by mouth every 6 (six) hours as needed.   60 capsule   0   . EPINEPHrine 0.3 mg/0.3 mL  IJ SOAJ injection   Intramuscular   Inject 0.3 mLs (0.3 mg total) into the muscle once. Follow package instructions as needed for severe allergy or anaphylactic reaction.   1 Device   2   . famotidine (PEPCID) 20 MG tablet   Oral   Take 1 tablet (20 mg total) by mouth 2 (two) times daily.   60 tablet   0   . hydrochlorothiazide (MICROZIDE) 12.5 MG capsule   Oral   Take 2 capsules (25 mg total) by mouth daily. Patient not taking: Reported on 04/07/2016   30 capsule   3   . nitrofurantoin (MACRODANTIN) 100 MG capsule   Oral   Take 1 capsule (100 mg total) by mouth 2 (two) times daily.   6 capsule   0   . predniSONE (DELTASONE) 20 MG tablet   Oral   Take 2 tablets (40 mg total) by mouth daily.   8 tablet   0      Allergies Review of patient's allergies indicates no known allergies.   Family History  Problem Relation Age of Onset  . Diabetes Mother   . Hypertension Mother   . Asthma Brother     Social History Social History  Substance Use Topics  . Smoking status: Never Smoker   . Smokeless tobacco: Never Used  . Alcohol Use: No    Review  of Systems  Constitutional:   Positive chills.  ENT:   Positive throat swelling. Cardiovascular:   No chest pain. Respiratory:   No dyspnea or cough. Gastrointestinal:   Positive abdominal cramping..  Genitourinary:   Negative for dysuria or difficulty urinating. Musculoskeletal:   Negative for focal pain or swelling Neurological:   Negative for headaches positive dizziness and syncope 10-point ROS otherwise negative.  ____________________________________________   PHYSICAL EXAM:  VITAL SIGNS: ED Triage Vitals  Enc Vitals Group     BP 04/09/16 0627 107/59 mmHg     Pulse Rate 04/09/16 0627 88     Resp 04/09/16 0627 22     Temp 04/09/16 0627 98.2 F (36.8 C)     Temp Source 04/09/16 0627 Oral     SpO2 04/09/16 0628 100 %     Weight --      Height --      Head Cir --      Peak Flow --      Pain Score  04/09/16 0643 0     Pain Loc --      Pain Edu? --      Excl. in GC? --     Vital signs reviewed, nursing assessments reviewed. Blood pressure 100/50 on my exam  Constitutional:   Alert and oriented. Well appearing and in no distress. Eyes:   No scleral icterus. No conjunctival pallor. PERRL. EOMI.  No nystagmus. ENT   Head:   Normocephalic and atraumatic.   Nose:   No congestion/rhinnorhea. No septal hematoma   Mouth/Throat:   Dry mucous membranes, no pharyngeal erythema. No peritonsillar mass.    Neck:   No stridor. No SubQ emphysema. No meningismus. Hematological/Lymphatic/Immunilogical:   No cervical lymphadenopathy. Cardiovascular:   RRR. Symmetric bilateral radial and DP pulses.  No murmurs.  Respiratory:   Normal respiratory effort without tachypnea nor retractions. Breath sounds are clear and equal bilaterally. No wheezes/rales/rhonchi. Gastrointestinal:   Soft with epigastric and suprapubic tenderness. Non distended. There is no CVA tenderness.  No rebound, rigidity, or guarding. Negative Murphy sign Genitourinary:   deferred Musculoskeletal:   Nontender with normal range of motion in all extremities. No joint effusions.  No lower extremity tenderness.  No edema. Neurologic:   Normal speech and language.  CN 2-10 normal. Motor grossly intact. No gross focal neurologic deficits are appreciated.  Skin:    Positive confluent urticarial rash on bilateral upper extremities as well as on the upper trunk and neck..  ____________________________________________    LABS (pertinent positives/negatives) (all labs ordered are listed, but only abnormal results are displayed) Labs Reviewed  COMPREHENSIVE METABOLIC PANEL - Abnormal; Notable for the following:    Potassium 3.4 (*)    Glucose, Bld 110 (*)    Calcium 8.2 (*)    All other components within normal limits  CBC WITH DIFFERENTIAL/PLATELET - Abnormal; Notable for the following:    RDW 16.3 (*)    Neutro Abs 6.9  (*)    All other components within normal limits  URINALYSIS COMPLETEWITH MICROSCOPIC ([facility] ONLY) - Abnormal; Notable for the following:    Color, Urine YELLOW (*)    APPearance CLOUDY (*)    Hgb urine dipstick 1+ (*)    Protein, ur 30 (*)    Leukocytes, UA TRACE (*)    Bacteria, UA MANY (*)    Squamous Epithelial / LPF 0-5 (*)    All other components within normal limits  URINE CULTURE  LIPASE, BLOOD  POC URINE PREG, ED  POCT PREGNANCY, URINE   ____________________________________________   EKG    ____________________________________________    RADIOLOGY  Chest x-ray unremarkable  ____________________________________________   PROCEDURES  CRITICAL CARE Performed by: Scotty CourtSTAFFORD, Alin Hutchins   Total critical care time: 35 minutes  Critical care time was exclusive of separately billable procedures and treating other patients.  Critical care was necessary to treat or prevent imminent or life-threatening deterioration.  Critical care was time spent personally by me on the following activities: development of treatment plan with patient and/or surrogate as well as nursing, discussions with consultants, evaluation of patient's response to treatment, examination of patient, obtaining history from patient or surrogate, ordering and performing treatments and interventions, ordering and review of laboratory studies, ordering and review of radiographic studies, pulse oximetry and re-evaluation of patient's condition.  ____________________________________________   INITIAL IMPRESSION / ASSESSMENT AND PLAN / ED COURSE  Pertinent labs & imaging results that were available during my care of the patient were reviewed by me and considered in my medical decision making (see chart for details).  Patient presents with multitude of symptoms including skin rash, abdominal complaints, upper airway complaints. She's been recently taking steroids and antihistamines which may be somewhat  masking the symptoms, but with the persistence of the symptoms and syncope and apparently orthostatic symptoms, this is consistent with anaphylaxis. There is no clear new exposure, but with her recently giving birth she is sure to have many new fabrics and other materials in her home that she hasn't been previously exposed to. Give the patient intramuscular epinephrine, Decadron, famotidine and Benadryl, IV fluids. Check labs and urinalysis.    ----------------------------------------- 10:50 AM on 04/09/2016 -----------------------------------------  Patient feeling better. Calm and comfortable. Vital signs stable, blood pressure stable. Workup significant for urinary tract infection. Patient tolerating oral intake. Hydrated vigorously with IV fluids. We'll discharge home with prednisone and Benadryl famotidine Macrobid and prescription for EpiPen as needed. Follow closely with primary care, return precautions given.   ____________________________________________   FINAL CLINICAL IMPRESSION(S) / ED DIAGNOSES  Final diagnoses:  Anaphylaxis, initial encounter  Cystitis       Portions of this note were generated with dragon dictation software. Dictation errors may occur despite best attempts at proofreading.   Sharman CheekPhillip Jadore Mcguffin, MD 04/09/16 1051

## 2016-04-09 NOTE — ED Notes (Signed)
Pt states on Sat morning woke w/ hives, [facility] was treated w/ claritin and a steroid shot for hives. Pt states she last took benadryl last night. Pt presents w/ hives today, c/o feeling as if she were going to pass out. Pt was incontinent of stool during triage. Pt states she is unable to control her BM at this time and she feels as if she is going to pass out. Pt is somewhat sweaty and pale. Pt has no angioedema at this time.

## 2016-04-09 NOTE — Progress Notes (Signed)
Patient ID: Cathy Webster, female   DOB: Mar 06, 1991, 25 y.o.   MRN: [id number] Subjective:     Cathy Webster is a 25 y.o. female who presents for a postpartum visit. She is 6 weeks postpartum following a spontaneous vaginal delivery. I have fully reviewed the prenatal and intrapartum course. The delivery was at [id number] gestational weeks. Outcome: spontaneous vaginal delivery. Anesthesia: epidural. Postpartum course has been complicated by admission for preeclampsia, treated w/ mg and hctz, off hctz. Baby's course has been uncomplicated. Baby is feeding by bottle - Similac Advance. Bleeding no bleeding. Bowel function is normal. Bladder function is normal. Patient is not sexually active. Contraception method is IUD. Postpartum depression screening: negative.  The following portions of the patient's history were reviewed and updated as appropriate: allergies, current medications, past family history, past medical history, past social history, past surgical history and problem list.  Review of Systems Pertinent items are noted in HPI.   Objective:   Filed Vitals:   04/09/16 1535  BP: 109/66  Pulse: 101    BP 109/66 mmHg  Pulse 101  Ht 5\' 4"  (1.626 m)  Wt 218 lb 11.2 oz (99.202 kg)  BMI 37.52 kg/m2  LMP 03/28/2016  Breastfeeding? No  General:  alert, cooperative and appears stated age     Lungs: clear to auscultation bilaterally  Heart:  regular rate and rhythm, S1, S2 normal, no murmur, click, rub or gallop  Abdomen: soft, non-tender; bowel sounds normal; no masses,  no organomegaly   gu: Normal vagina and cervix                       liletta procedure Written informed consent Bimanual - anteverted uterus Speculum Betadine x2 Tenaculum anterior lip Sounded to 9 cm liletta placed according to manufacturer's specifications Strings cut to 3 cm Tenaculum removed, bleeding minimal No complications Assessment:     normal postpartum exam. Pap smear wnl 2016  Plan:    1. Contraception: iud placed today, no complications, f/u 4 wks string check. upt neg, recommending backup contraction for 7 days, though last period was less than 5 days ago so should be covered 2. No s/s PPD 3. bp wnl (preE this pregnancy)

## 2016-04-09 NOTE — Addendum Note (Signed)
Addended by: Garret ReddishBARNES, Ashur Glatfelter M on: 04/09/2016 04:35 PM   Modules accepted: Orders

## 2016-04-09 NOTE — ED Notes (Signed)
4 oz ginger ale given. Continue to monitor

## 2016-04-10 LAB — URINE CULTURE

## 2016-05-07 ENCOUNTER — Ambulatory Visit: Payer: Self-pay | Admitting: Obstetrics & Gynecology

## 2016-11-05 ENCOUNTER — Ambulatory Visit
Admission: EM | Admit: 2016-11-05 | Discharge: 2016-11-05 | Disposition: A | Discharge status: Home / Self Care | Payer: BC Managed Care – PPO | Attending: Family Medicine | Admitting: Family Medicine

## 2016-11-05 ENCOUNTER — Ambulatory Visit (INDEPENDENT_AMBULATORY_CARE_PROVIDER_SITE_OTHER): Payer: BC Managed Care – PPO

## 2016-11-05 DIAGNOSIS — S161XXA Strain of muscle, fascia and tendon at neck level, initial encounter: Secondary | ICD-10-CM

## 2016-11-05 DIAGNOSIS — M791 Myalgia: Secondary | ICD-10-CM | POA: Diagnosis not present

## 2016-11-05 DIAGNOSIS — M7918 Myalgia, other site: Secondary | ICD-10-CM

## 2016-11-05 MED ORDER — NAPROXEN 500 MG PO TABS: 500.0000 mg | ORAL_TABLET | Freq: Two times a day (BID) | ORAL | 0 refills | Status: DC

## 2016-11-05 MED ORDER — METAXALONE 800 MG PO TABS: 800.0000 mg | ORAL_TABLET | Freq: Three times a day (TID) | ORAL | 0 refills | Status: DC

## 2016-11-05 MED ORDER — MUPIROCIN 2 % EX OINT: 1.0000 "application " | TOPICAL_OINTMENT | Freq: Three times a day (TID) | CUTANEOUS | 0 refills | Status: DC
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
## 2016-12-12 ENCOUNTER — Emergency Department
Admission: EM | Admit: 2016-12-12 | Discharge: 2016-12-12 | Disposition: A | Discharge status: Home / Self Care | Payer: BC Managed Care – PPO | Attending: Emergency Medicine | Admitting: Emergency Medicine

## 2016-12-12 DIAGNOSIS — J069 Acute upper respiratory infection, unspecified: Secondary | ICD-10-CM | POA: Insufficient documentation

## 2016-12-12 DIAGNOSIS — I10 Essential (primary) hypertension: Secondary | ICD-10-CM | POA: Diagnosis not present

## 2016-12-12 DIAGNOSIS — R0981 Nasal congestion: Secondary | ICD-10-CM | POA: Diagnosis present

## 2016-12-12 MED ORDER — FLUTICASONE PROPIONATE 50 MCG/ACT NA SUSP: 2.0000 | Freq: Every day | NASAL | 0 refills | Status: DC

## 2016-12-12 NOTE — Discharge Instructions (Signed)
Increase fluids. Tylenol or ibuprofen as needed for headache or sinus pressure. Continue Mucinex as directed every day. Flonase nasal spray 2 sprays each nostril once a day. You  may also use saline nose spray for nasal congestion. [facility] any continued problems.

## 2016-12-12 NOTE — ED Provider Notes (Signed)
[facility] Emergency Department Provider Note   ____________________________________________   First MD Initiated Contact with Patient 12/12/16 [phone]     (approximate)  I have reviewed the triage vital signs and the nursing notes.   HISTORY  Chief Complaint Nasal Congestion and Facial Pain    HPI Cathy Webster is a 26 y.o. female is here with complaint of sinus pressure and congestion for the last 2 and half days. Patient states that she is unaware of any fever and denies chills. She states that occasionally she has some night sweats. She has nasal congestion. Patient has tried Mucinex infrequently without any relief of her nasal congestion.     Past Medical History:  Diagnosis Date  . Abnormal Pap smear 2008  . Cystitis 04/09/16  . Hives   . Hypertension   . Preeclampsia   . UTI (lower urinary tract infection)     Patient Active Problem List   Diagnosis Date Noted  . Contraceptive management 04/09/2016  . Preeclampsia in postpartum period 03/01/2016  . Abnormal Pap smear of cervix 12/22/2013    Past Surgical History:  Procedure Laterality Date  . CESAREAN SECTION    . TONSILLECTOMY      Prior to Admission medications   Medication Sig Start Date End Date Taking? Authorizing Provider  fluticasone (FLONASE) 50 MCG/ACT nasal spray Place 2 sprays into both nostrils daily. 12/12/16 12/12/17  Tommi Rumpshonda L Summers, PA-C    Allergies Patient has no known allergies.  Family History  Problem Relation Age of Onset  . Diabetes Mother   . Hypertension Mother   . Asthma Brother     Social History Social History  Substance Use Topics  . Smoking status: Never Smoker  . Smokeless tobacco: Never Used  . Alcohol use No    Review of Systems Constitutional: No fever/chills Eyes: No visual changes. ENT: No sore throat.  Positive nasal congestion. Positive sinus pressure. Cardiovascular: Denies chest pain. Respiratory: Denies shortness of  breath. Gastrointestinal: No abdominal pain.  No nausea, no vomiting.  Musculoskeletal: Negative for back pain. Skin: Negative for rash. Neurological: Negative for headaches, focal weakness or numbness.  10-point ROS otherwise negative.  ____________________________________________   PHYSICAL EXAM:  VITAL SIGNS: ED Triage Vitals  Enc Vitals Group     BP 12/12/16 0759 137/77     Pulse Rate 12/12/16 0758 84     Resp 12/12/16 0758 18     Temp 12/12/16 0758 97.9 F (36.6 C)     Temp Source 12/12/16 0758 Oral     SpO2 12/12/16 0758 98 %     Weight 12/12/16 0759 270 lb (122.5 kg)     Height 12/12/16 0759 5\' 5"  (1.651 m)     Head Circumference --      Peak Flow --      Pain Score 12/12/16 0759 6     Pain Loc --      Pain Edu? --      Excl. in GC? --     Constitutional: Alert and oriented. Well appearing and in no acute distress. Eyes: Conjunctivae are normal. PERRL. EOMI. Head: Atraumatic. Nose: Mild congestion/rhinnorhea.  Pale, swollen turbinates without any area wet discharge. EACs are clear. TMs are dull bilaterally. No point tenderness to percussion of the sinuses. Mouth/Throat: Mucous membranes are moist.  Oropharynx non-erythematous. Neck: No stridor.   Hematological/Lymphatic/Immunilogical: No cervical lymphadenopathy. Cardiovascular: Normal rate, regular rhythm. Grossly normal heart sounds.  Good peripheral circulation. Respiratory: Normal respiratory effort.  No  retractions. Lungs CTAB. Gastrointestinal: Soft and nontender. No distention.  Musculoskeletal: Moves upper and lower extremities without any difficulty. Normal gait was noted. Neurologic:  Normal speech and language. No gross focal neurologic deficits are appreciated. No gait instability. Skin:  Skin is warm, dry and intact. No rash noted. Psychiatric: Mood and affect are normal. Speech and behavior are normal.  ____________________________________________   LABS (all labs ordered are listed, but only  abnormal results are displayed)  Labs Reviewed - No data to display   PROCEDURES  Procedure(s) performed: None  Procedures  Critical Care performed: No  ____________________________________________   INITIAL IMPRESSION / ASSESSMENT AND PLAN / ED COURSE  Pertinent labs & imaging results that were available during my care of the patient were reviewed by me and considered in my medical decision making (see chart for details).  Patient is continue using Mucinex as needed for congestion. She is also given a prescription for Flonase nasal spray 2 sprays each nostril daily. She may also take Tylenol or ibuprofen as needed for sinus pain. She may also use over-the-counter saline nose spray if needed for nasal congestion. She will follow-up with her primary care doctor if any continued problems or [facility].      ____________________________________________   FINAL CLINICAL IMPRESSION(S) / ED DIAGNOSES  Final diagnoses:  Acute upper respiratory infection      NEW MEDICATIONS STARTED DURING THIS VISIT:  Discharge Medication List as of 12/12/2016  9:36 AM    START taking these medications   Details  fluticasone (FLONASE) 50 MCG/ACT nasal spray Place 2 sprays into both nostrils daily., Starting Wed 12/12/2016, Until Thu 12/12/2017, Print         Note:  This document was prepared using Dragon voice recognition software and may include unintentional dictation errors.    Tommi Rumps, PA-C 12/12/16 1354    Jene Every, MD 12/12/16 [phone]

## 2016-12-12 NOTE — ED Notes (Signed)
See triage note. States she developed sinus pressure on Monday  States pressure and congestion became worse  Unsure of fever but has had night sweats  Afebrile on arrival

## 2016-12-12 NOTE — ED Triage Notes (Signed)
Pt c/o sinus pressure with congestion since Monday.

## 2017-02-03 ENCOUNTER — Emergency Department
Admission: EM | Admit: 2017-02-03 | Discharge: 2017-02-03 | Disposition: A | Discharge status: Home / Self Care | Payer: BC Managed Care – PPO | Attending: Emergency Medicine | Admitting: Emergency Medicine

## 2017-02-03 DIAGNOSIS — K529 Noninfective gastroenteritis and colitis, unspecified: Secondary | ICD-10-CM | POA: Diagnosis not present

## 2017-02-03 DIAGNOSIS — R1013 Epigastric pain: Secondary | ICD-10-CM

## 2017-02-03 DIAGNOSIS — I10 Essential (primary) hypertension: Secondary | ICD-10-CM | POA: Insufficient documentation

## 2017-02-03 LAB — CBC
HEMATOCRIT: 38.5 % (ref 35.0–47.0)
HEMOGLOBIN: 13 g/dL (ref 12.0–16.0)
MCH: 28.3 pg (ref 26.0–34.0)
MCHC: 33.8 g/dL (ref 32.0–36.0)
MCV: 83.9 fL (ref 80.0–100.0)
Platelets: 183 10*3/uL (ref 150–440)
RBC: 4.59 MIL/uL (ref 3.80–5.20)
RDW: 14 % (ref 11.5–14.5)
WBC: 3.5 10*3/uL — AB (ref 3.6–11.0)

## 2017-02-03 LAB — URINALYSIS, COMPLETE (UACMP) WITH MICROSCOPIC
BACTERIA UA: NONE SEEN
BILIRUBIN URINE: NEGATIVE
Glucose, UA: NEGATIVE mg/dL
Hgb urine dipstick: NEGATIVE
KETONES UR: NEGATIVE mg/dL
LEUKOCYTES UA: NEGATIVE
Nitrite: NEGATIVE
PH: 5 (ref 5.0–8.0)
PROTEIN: NEGATIVE mg/dL
Specific Gravity, Urine: 1.023 (ref 1.005–1.030)

## 2017-02-03 LAB — COMPREHENSIVE METABOLIC PANEL
ALT: 16 U/L (ref 14–54)
AST: 22 U/L (ref 15–41)
Albumin: 3.7 g/dL (ref 3.5–5.0)
Alkaline Phosphatase: 57 U/L (ref 38–126)
Anion gap: 6 (ref 5–15)
BUN: 6 mg/dL (ref 6–20)
CHLORIDE: 106 mmol/L (ref 101–111)
CO2: 22 mmol/L (ref 22–32)
Calcium: 8.1 mg/dL — ABNORMAL LOW (ref 8.9–10.3)
Creatinine, Ser: 0.52 mg/dL (ref 0.44–1.00)
GFR calc Af Amer: >60 mL/min (ref >60)
Glucose, Bld: 87 mg/dL (ref 65–99)
POTASSIUM: 3.4 mmol/L — AB (ref 3.5–5.1)
SODIUM: 134 mmol/L — AB (ref 135–145)
Total Bilirubin: 0.8 mg/dL (ref 0.3–1.2)
Total Protein: 7.3 g/dL (ref 6.5–8.1)

## 2017-02-03 LAB — LIPASE, BLOOD: LIPASE: 18 U/L (ref 11–51)

## 2017-02-03 LAB — POCT PREGNANCY, URINE: PREG TEST UR: NEGATIVE

## 2017-02-03 MED ORDER — GI COCKTAIL ~~LOC~~
30.0000 mL | Freq: Once | ORAL | Status: AC
  Administered 2017-02-03: 30 mL via ORAL

## 2017-02-03 MED ORDER — ONDANSETRON HCL 4 MG PO TABS: 4.0000 mg | ORAL_TABLET | Freq: Every day | ORAL | 0 refills | Status: DC | PRN

## 2017-02-03 MED ORDER — ONDANSETRON HCL 4 MG/2ML IJ SOLN
4.0000 mg | Freq: Once | INTRAMUSCULAR | Status: AC
  Administered 2017-02-03: 4 mg via INTRAVENOUS

## 2017-02-03 MED ORDER — FAMOTIDINE IN NACL 20-0.9 MG/50ML-% IV SOLN
20.0000 mg | Freq: Once | INTRAVENOUS | Status: AC
  Administered 2017-02-03: 20 mg via INTRAVENOUS

## 2017-02-03 MED ORDER — GI COCKTAIL ~~LOC~~
ORAL | Status: AC
  Filled 2017-02-03: qty 30

## 2017-02-03 MED ORDER — SODIUM CHLORIDE 0.9 % IV BOLUS (SEPSIS)
1000.0000 mL | Freq: Once | INTRAVENOUS | Status: AC
  Administered 2017-02-03: 1000 mL via INTRAVENOUS

## 2017-02-03 MED ORDER — ONDANSETRON HCL 4 MG/2ML IJ SOLN
INTRAMUSCULAR | Status: AC
  Filled 2017-02-03: qty 2

## 2017-02-03 MED ORDER — FAMOTIDINE IN NACL 20-0.9 MG/50ML-% IV SOLN
INTRAVENOUS | Status: DC
Start: 2017-02-03 — End: 2017-02-04
  Filled 2017-02-03: qty 50

## 2017-02-03 NOTE — ED Provider Notes (Addendum)
[facility] Emergency Department Provider Note  ____________________________________________   I have reviewed the triage vital signs and the nursing notes.   HISTORY  Chief Complaint Abdominal Pain    HPI Cathy Webster is a 26 y.o. female who presents today with nausea and diarrhea. Also abdominal cramping. Is mild diffuse abdominal cramping and some epigastric discomfort. Has vomited and felt nauseated. No fever. Her husband has the same or similar symptoms. No recent travel out of the country no recent antibiotics no recent camping. No bleeding no melena bright red blood per rectum no headache no stiff neck no fever no chills.no hematemesis.     Past Medical History:  Diagnosis Date  . Abnormal Pap smear 2008  . Cystitis 04/09/16  . Hives   . Hypertension   . Preeclampsia   . UTI (lower urinary tract infection)     Patient Active Problem List   Diagnosis Date Noted  . Contraceptive management 04/09/2016  . Preeclampsia in postpartum period 03/01/2016  . Abnormal Pap smear of cervix 12/22/2013    Past Surgical History:  Procedure Laterality Date  . CESAREAN SECTION    . TONSILLECTOMY      Prior to Admission medications   Medication Sig Start Date End Date Taking? Authorizing Provider  fluticasone (FLONASE) 50 MCG/ACT nasal spray Place 2 sprays into both nostrils daily. 12/12/16 12/12/17  Tommi Rumps, PA-C    Allergies Patient has no known allergies.  Family History  Problem Relation Age of Onset  . Diabetes Mother   . Hypertension Mother   . Asthma Brother     Social History Social History  Substance Use Topics  . Smoking status: Never Smoker  . Smokeless tobacco: Never Used  . Alcohol use No    Review of Systems Constitutional: No fever/chills Eyes: No visual changes. ENT: No sore throat. No stiff neck no neck pain Cardiovascular: Denies chest pain. Respiratory: Denies shortness of breath. Gastrointestinal:    Positive vomiting.  Positive diarrhea.  No constipation. Genitourinary: Negative for dysuria. Musculoskeletal: Negative lower extremity swelling Skin: Negative for rash. Neurological: Negative for severe headaches, focal weakness or numbness. 10-point ROS otherwise negative.  ____________________________________________   PHYSICAL EXAM:  VITAL SIGNS: ED Triage Vitals [02/03/17 2017]  Enc Vitals Group     BP 125/82     Pulse Rate 90     Resp 18     Temp 98.5 F (36.9 C)     Temp Source Oral     SpO2 98 %     Weight 270 lb (122.5 kg)     Height  (1.676 m)     Head Circumference      Peak Flow      Pain Score 8     Pain Loc      Pain Edu?      Excl. in GC?     Constitutional: Alert and oriented. Well appearing and in no acute distress. Eyes: Conjunctivae are normal. PERRL. EOMI. Head: Atraumatic. Nose: No congestion/rhinnorhea. Mouth/Throat: Mucous membranes are moist.  Oropharynx non-erythematous. Neck: No stridor.   Nontender with no meningismus Cardiovascular: Normal rate, regular rhythm. Grossly normal heart sounds.  Good peripheral circulation. Respiratory: Normal respiratory effort.  No retractions. Lungs CTAB. Abdominal: Soft and Slight epigastric tenderness no right lower quadrant tenderness no right upper quadrant tenderness distractible. No distention. No guarding no rebound Back:  There is no focal tenderness or step off.  there is no midline tenderness there are no lesions noted.  there is no CVA tenderness  Musculoskeletal: No lower extremity tenderness, no upper extremity tenderness. No joint effusions, no DVT signs strong distal pulses no edema Neurologic:  Normal speech and language. No gross focal neurologic deficits are appreciated.  Skin:  Skin is warm, dry and intact. No rash noted. Psychiatric: Mood and affect are normal. Speech and behavior are normal.  ____________________________________________   LABS (all labs ordered are listed, but only  abnormal results are displayed)  Labs Reviewed  COMPREHENSIVE METABOLIC PANEL - Abnormal; Notable for the following:       Result Value   Sodium 134 (*)    Potassium 3.4 (*)    Calcium 8.1 (*)    All other components within normal limits  CBC - Abnormal; Notable for the following:    WBC 3.5 (*)    All other components within normal limits  URINALYSIS, COMPLETE (UACMP) WITH MICROSCOPIC - Abnormal; Notable for the following:    Color, Urine YELLOW (*)    APPearance CLEAR (*)    Squamous Epithelial / LPF 0-5 (*)    All other components within normal limits  LIPASE, BLOOD  POC URINE PREG, ED  POCT PREGNANCY, URINE   ____________________________________________  EKG  I personally interpreted any EKGs ordered by me or triage  ____________________________________________  RADIOLOGY  I reviewed any imaging ordered by me or triage that were performed during my shift and, if possible, patient and/or family made aware of any abnormal findings. ____________________________________________   PROCEDURES  Procedure(s) performed: None  Procedures  Critical Care performed: None  ____________________________________________   INITIAL IMPRESSION / ASSESSMENT AND PLAN / ED COURSE  Pertinent labs & imaging results that were available during my care of the patient were reviewed by me and considered in my medical decision making (see chart for details).  Very well-appearing woman with nausea diarrhea and cramping, her blood work is unremarkable except for a slightly low white count which certainly is most likely consistent with a viral pathology. Patient has a benign abdomen with minimal nonsurgical discomfort. Low suspicion for a letter disease. Vital signs blood work etc. all reassuring. We'll treat her symptomatically for her diarrheal pathology and reassess. Patient very cut with this plan. She is very nontoxic in appearance.----------------------------------------- 10:25 PM on  02/03/2017 -----------------------------------------  Patient has been in no acute distress since her arrival feels much better we'll try by mouth challenge.  ----------------------------------------- 10:57 PM on 02/03/2017 -----------------------------------------  Patient in no acute distress abdomen benign nontender tolerating by mouth requesting discharge feels very control going home will return for new or worrisome symptoms    ____________________________________________   FINAL CLINICAL IMPRESSION(S) / ED DIAGNOSES  Final diagnoses:  None      This chart was dictated using voice recognition software.  Despite best efforts to proofread,  errors can occur which can change meaning.      Jeanmarie Plant, MD 02/03/17 2210    Jeanmarie Plant, MD 02/03/17 1610    Jeanmarie Plant, MD 02/03/17 [phone]

## 2017-02-03 NOTE — ED Triage Notes (Signed)
Reports symptoms for 2 days.  Reports nausea and weak feeling for 2 days, vomiting that started yesterday and abdominal pain that started today.

## 2017-02-03 NOTE — ED Notes (Signed)
Pt awoken at this time, states she feels a lot better, given water to drink per MD order. Will continue to monitor for further patient needs.

## 2017-07-29 ENCOUNTER — Encounter: Payer: Self-pay | Admitting: Emergency Medicine

## 2017-07-29 ENCOUNTER — Emergency Department
Admission: EM | Admit: 2017-07-29 | Discharge: 2017-07-29 | Disposition: A | Discharge status: Home / Self Care | Payer: BC Managed Care – PPO | Attending: Emergency Medicine | Admitting: Emergency Medicine

## 2017-07-29 DIAGNOSIS — B349 Viral infection, unspecified: Secondary | ICD-10-CM | POA: Insufficient documentation

## 2017-07-29 DIAGNOSIS — I10 Essential (primary) hypertension: Secondary | ICD-10-CM | POA: Insufficient documentation

## 2017-07-29 DIAGNOSIS — J069 Acute upper respiratory infection, unspecified: Secondary | ICD-10-CM | POA: Insufficient documentation

## 2017-07-29 DIAGNOSIS — R05 Cough: Secondary | ICD-10-CM | POA: Diagnosis present

## 2017-07-29 MED ORDER — OXYMETAZOLINE HCL 0.05 % NA SOLN
1.0000 | Freq: Once | NASAL | Status: AC
  Administered 2017-07-29: 1 via NASAL
  Filled 2017-07-29: qty 15

## 2017-07-29 MED ORDER — FLUTICASONE PROPIONATE 50 MCG/ACT NA SUSP: 1.0000 | Freq: Every day | NASAL | 2 refills | Status: DC

## 2017-07-29 MED ORDER — PSEUDOEPHEDRINE HCL 30 MG PO TABS: 30.0000 mg | ORAL_TABLET | Freq: Four times a day (QID) | ORAL | 2 refills | Status: AC | PRN

## 2017-07-29 NOTE — ED Triage Notes (Signed)
Pt c/o nasal congestion and cough x1 day without relief by OTC medication. Pt denies SOB and chest pain.

## 2017-07-29 NOTE — ED Provider Notes (Signed)
[facility] Emergency Department Provider Note  ____________________________________________  Time seen: Approximately 10:24 PM  I have reviewed the triage vital signs and the nursing notes.   HISTORY  Chief Complaint Cough and Nasal Congestion    HPI Cathy Webster is a 26 y.o. female presents to the emergency department with rhinorrhea, congestion, nonproductive cough and myalgias for one day. Patient reports a prodrome of headache but no diarrhea or emesis. She is tolerating fluids and food by mouth with no major changes in stooling or urinary habits. No medications were attempted prior to presenting to the emergency department. Patient is a fifth grade teacher.   Past Medical History:  Diagnosis Date  . Abnormal Pap smear 2008  . Cystitis 04/09/16  . Hives   . Hypertension   . Preeclampsia   . UTI (lower urinary tract infection)     Patient Active Problem List   Diagnosis Date Noted  . Contraceptive management 04/09/2016  . Preeclampsia in postpartum period 03/01/2016  . Abnormal Pap smear of cervix 12/22/2013    Past Surgical History:  Procedure Laterality Date  . CESAREAN SECTION    . TONSILLECTOMY      Prior to Admission medications   Medication Sig Start Date End Date Taking? Authorizing Provider  fluticasone (FLONASE) 50 MCG/ACT nasal spray Place 1 spray into both nostrils daily. 07/29/17 07/29/18  Orvil Feil, PA-C  ondansetron (ZOFRAN) 4 MG tablet Take 1 tablet (4 mg total) by mouth daily as needed for nausea or vomiting. 02/03/17   Jeanmarie Plant, MD  pseudoephedrine (SUDAFED) 30 MG tablet Take 1 tablet (30 mg total) by mouth every 6 (six) hours as needed for congestion. 07/29/17 07/29/18  Orvil Feil, PA-C    Allergies Patient has no known allergies.  Family History  Problem Relation Age of Onset  . Diabetes Mother   . Hypertension Mother   . Asthma Brother     Social History Social History  Substance Use  Topics  . Smoking status: Never Smoker  . Smokeless tobacco: Never Used  . Alcohol use No     Review of Systems  Constitutional: Patient has been afebrile.  Eyes: No visual changes. No discharge ENT: Patient has had congestion.  Cardiovascular: no chest pain. Respiratory: Patient has had non-productive cough.  No SOB. Gastrointestinal: No nausea, vomiting or diarrhea. Genitourinary: Negative for dysuria. No hematuria Musculoskeletal: Patient has had myalgias. Skin: Negative for rash, abrasions, lacerations, ecchymosis. Neurological: Negative for headaches, focal weakness or numbness.  ____________________________________________   PHYSICAL EXAM:  VITAL SIGNS: ED Triage Vitals  Enc Vitals Group     BP 07/29/17 1900 134/81     Pulse Rate 07/29/17 1900 89     Resp 07/29/17 1900 17     Temp 07/29/17 1900 98.6 F (37 C)     Temp Source 07/29/17 1900 Oral     SpO2 07/29/17 1900 100 %     Weight 07/29/17 1859 270 lb (122.5 kg)     Height --      Head Circumference --      Peak Flow --      Pain Score 07/29/17 2024 6     Pain Loc --      Pain Edu? --      Excl. in GC? --      Constitutional: Alert and oriented. Patient is lying supine in bed.  Eyes: Conjunctivae are normal. PERRL. EOMI. Head: Atraumatic. ENT:      Ears: Tympanic membranes are  injected bilaterally without evidence of effusion or purulent exudate. Bony landmarks are visualized bilaterally. No pain with palpation at the tragus.      Nose: Nasal turbinates are edematous and erythematous. Copious rhinorrhea visualized.      Mouth/Throat: Mucous membranes are moist. Posterior pharynx is mildly erythematous. No tonsillar hypertrophy or purulent exudate. Uvula is midline. Neck: Full range of motion. No pain is elicited with flexion at the neck. Hematological/Lymphatic/Immunilogical: No cervical lymphadenopathy. Cardiovascular: Normal rate, regular rhythm. Normal S1 and S2.  Good peripheral  circulation. Respiratory: Normal respiratory effort without tachypnea or retractions. Lungs CTAB. Good air entry to the bases with no decreased or absent breath sounds. Gastrointestinal: Bowel sounds 4 quadrants. Soft and nontender to palpation. No guarding or rigidity. No palpable masses. No distention. No CVA tenderness.  Skin:  Skin is warm, dry and intact. No rash noted. Psychiatric: Mood and affect are normal. Speech and behavior are normal. Patient exhibits appropriate insight and judgement.  ____________________________________________   LABS (all labs ordered are listed, but only abnormal results are displayed)  Labs Reviewed - No data to display ____________________________________________  EKG   ____________________________________________  RADIOLOGY   No results found.  ____________________________________________    PROCEDURES  Procedure(s) performed:    Procedures    Medications  oxymetazoline (AFRIN) 0.05 % nasal spray 1 spray (1 spray Each Nare Given 07/29/17 2021)     ____________________________________________   INITIAL IMPRESSION / ASSESSMENT AND PLAN / ED COURSE  Pertinent labs & imaging results that were available during my care of the patient were reviewed by me and considered in my medical decision making (see chart for details).  Review of the [place] CSRS was performed in accordance of the [facility] prior to dispensing any controlled drugs.     Assessment and plan Viral URI Differential diagnosis included seasonal allergies versus viral upper respiratory tract infection. Sudden onset of rhinorrhea, congestion and nonproductive cough with myalgias increases suspicion for upper respiratory tract infection. Duration of symptoms does not warrant chest x-ray at this time. Supportive measures such as Flonase and Sudafed were prescribed at discharge. Patient was advised to follow up with primary care as needed. Vital signs are reassuring prior to  discharge. All patient questions were answered.   ____________________________________________  FINAL CLINICAL IMPRESSION(S) / ED DIAGNOSES  Final diagnoses:  Viral upper respiratory tract infection      NEW MEDICATIONS STARTED DURING THIS VISIT:  Discharge Medication List as of 07/29/2017  8:08 PM    START taking these medications   Details  pseudoephedrine (SUDAFED) 30 MG tablet Take 1 tablet (30 mg total) by mouth every 6 (six) hours as needed for congestion., Starting Mon 07/29/2017, Until Tue 07/29/2018, Print            This chart was dictated using voice recognition software/Dragon. Despite best efforts to proofread, errors can occur which can change the meaning. Any change was purely unintentional.    Gasper LloydWoods, Kimyata Milich M, PA-C 07/29/17 2227    Phineas SemenGoodman, Graydon, MD 07/29/17 [phone]

## 2017-12-16 ENCOUNTER — Emergency Department (HOSPITAL_COMMUNITY)
Admission: EM | Admit: 2017-12-16 | Discharge: 2017-12-16 | Discharge status: Against Medical Advice | Payer: BC Managed Care – PPO

## 2017-12-16 NOTE — ED Triage Notes (Signed)
No answer for triage x3 

## 2017-12-16 NOTE — ED Triage Notes (Signed)
No answer for triage x1 

## 2017-12-17 ENCOUNTER — Emergency Department
Admission: EM | Admit: 2017-12-17 | Discharge: 2017-12-17 | Disposition: A | Discharge status: Home / Self Care | Payer: BC Managed Care – PPO | Attending: Emergency Medicine | Admitting: Emergency Medicine

## 2017-12-17 ENCOUNTER — Other Ambulatory Visit: Payer: Self-pay

## 2017-12-17 ENCOUNTER — Encounter: Payer: Self-pay | Admitting: Emergency Medicine

## 2017-12-17 DIAGNOSIS — I10 Essential (primary) hypertension: Secondary | ICD-10-CM | POA: Diagnosis not present

## 2017-12-17 DIAGNOSIS — R519 Headache, unspecified: Secondary | ICD-10-CM

## 2017-12-17 DIAGNOSIS — Z79899 Other long term (current) drug therapy: Secondary | ICD-10-CM | POA: Diagnosis not present

## 2017-12-17 DIAGNOSIS — R51 Headache: Secondary | ICD-10-CM | POA: Insufficient documentation

## 2017-12-17 MED ORDER — BUTALBITAL-APAP-CAFFEINE 50-325-40 MG PO TABS: 1.0000 | ORAL_TABLET | Freq: Four times a day (QID) | ORAL | 0 refills | Status: DC | PRN

## 2017-12-17 MED ORDER — AMOXICILLIN-POT CLAVULANATE 875-125 MG PO TABS: 1.0000 | ORAL_TABLET | Freq: Two times a day (BID) | ORAL | 0 refills | Status: DC

## 2017-12-17 NOTE — ED Provider Notes (Signed)
[facility] Emergency Department Provider Note  ____________________________________________  Time seen: Approximately 8:46 AM  I have reviewed the triage vital signs and the nursing notes.   HISTORY  Chief Complaint Headache and URI   HPI Cathy Webster is a 27 y.o. female who presents to the emergency department for evaluation of sinus pressure and headache.  Patient states that she had a URI last week, but over the weekend she noticed that she was more congested and developed a headache.  She has taken Tylenol and ibuprofen without relief.  She states the pain is behind her eyes and diffuse over the front of her forehead.  Past Medical History:  Diagnosis Date  . Abnormal Pap smear 2008  . Cystitis 04/09/16  . Hives   . Hypertension   . Preeclampsia   . UTI (lower urinary tract infection)     Patient Active Problem List   Diagnosis Date Noted  . Contraceptive management 04/09/2016  . Preeclampsia in postpartum period 03/01/2016  . Abnormal Pap smear of cervix 12/22/2013    Past Surgical History:  Procedure Laterality Date  . CESAREAN SECTION    . TONSILLECTOMY      Prior to Admission medications   Medication Sig Start Date End Date Taking? Authorizing Provider  amoxicillin-clavulanate (AUGMENTIN) 875-125 MG tablet Take 1 tablet by mouth 2 (two) times daily. 12/17/17   Betsabe Iglesia, Kasandra Knudsenari B, FNP  butalbital-acetaminophen-caffeine (FIORICET, ESGIC) 50-325-40 MG tablet Take 1 tablet by mouth every 6 (six) hours as needed for headache. 12/17/17 12/17/18  Yovan Leeman, Rulon Eisenmengerari B, FNP  fluticasone (FLONASE) 50 MCG/ACT nasal spray Place 1 spray into both nostrils daily. 07/29/17 07/29/18  Orvil FeilWoods, Jaclyn M, PA-C  ondansetron (ZOFRAN) 4 MG tablet Take 1 tablet (4 mg total) by mouth daily as needed for nausea or vomiting. 02/03/17   Jeanmarie PlantMcShane, James A, MD  pseudoephedrine (SUDAFED) 30 MG tablet Take 1 tablet (30 mg total) by mouth every 6 (six) hours as needed for  congestion. 07/29/17 07/29/18  Orvil FeilWoods, Jaclyn M, PA-C    Allergies Patient has no known allergies.  Family History  Problem Relation Age of Onset  . Diabetes Mother   . Hypertension Mother   . Asthma Brother     Social History Social History   Tobacco Use  . Smoking status: Never Smoker  . Smokeless tobacco: Never Used  Substance Use Topics  . Alcohol use: No  . Drug use: No    Review of Systems Constitutional: Negative for fever/positive for chills ENT: Negative for sore throat. Cardiovascular: Denies chest pain. Respiratory: No shortness of breath.  Negative for cough. Gastrointestinal: Negative for nausea, no vomiting.  No diarrhea.  Musculoskeletal: Negative for body aches Skin: Negative for rash. Neurological: Positive for headaches ____________________________________________   PHYSICAL EXAM:  VITAL SIGNS: ED Triage Vitals  Enc Vitals Group     BP 12/17/17 0832 (!) 158/77     Pulse Rate 12/17/17 0832 70     Resp 12/17/17 0832 18     Temp 12/17/17 0832 97.7 F (36.5 C)     Temp Source 12/17/17 0832 Oral     SpO2 12/17/17 0832 98 %     Weight 12/17/17 0835 270 lb (122.5 kg)     Height 12/17/17 0835 5\' 5"  (1.651 m)     Head Circumference --      Peak Flow --      Pain Score 12/17/17 0835 7     Pain Loc --      Pain  Edu? --      Excl. in GC? --     Constitutional: Alert and oriented.  Acutely ill appearing and in no acute distress. Eyes: Conjunctivae are normal. EOMI. Ears: Bilateral TMs are normal Nose: Sinus congestion noted; no rhinnorhea. Mouth/Throat: Mucous membranes are moist.  Oropharynx clear. Tonsils are flat. Neck: No stridor.  Lymphatic: No cervical lymphadenopathy. Cardiovascular: Normal rate, regular rhythm. Good peripheral circulation. Respiratory: Normal respiratory effort.  No retractions.  Breath sounds clear to auscultation. Gastrointestinal: Soft and nontender.  Musculoskeletal: FROM x 4 extremities.  Neurologic:  Normal speech  and language.  Skin:  Skin is warm, dry and intact. No rash noted. Psychiatric: Mood and affect are normal. Speech and behavior are normal.  ____________________________________________   LABS (all labs ordered are listed, but only abnormal results are displayed)  Labs Reviewed - No data to display ____________________________________________  EKG  Not indicated ____________________________________________  RADIOLOGY  Not indicated ____________________________________________   PROCEDURES  Procedure(s) performed: None  Critical Care performed: No ____________________________________________   INITIAL IMPRESSION / ASSESSMENT AND PLAN / ED COURSE  27 y.o. female who presents to the emergency department for evaluation and treatment of sinus congestion and headache.  Symptoms and exam are most consistent with bacterial sinusitis and she will be treated with Augmentin and given Fioricet for the headache.  She was encouraged to return to the ER for symptoms that change or worsen if unable to schedule an appointment.  Medications - No data to display  ED Discharge Orders        Ordered    butalbital-acetaminophen-caffeine (FIORICET, ESGIC) 50-325-40 MG tablet  Every 6 hours PRN     12/17/17 1008    amoxicillin-clavulanate (AUGMENTIN) 875-125 MG tablet  2 times daily     12/17/17 1008       Pertinent labs & imaging results that were available during my care of the patient were reviewed by me and considered in my medical decision making (see chart for details).    If controlled substance prescribed during this visit, 12 month history viewed on the [place] prior to issuing an initial prescription for Schedule II or III opiod. ____________________________________________   FINAL CLINICAL IMPRESSION(S) / ED DIAGNOSES  Final diagnoses:  Sinus headache    Note:  This document was prepared using Dragon voice recognition software and may include unintentional dictation  errors.     Chinita Pester, FNP 12/17/17 1043    Jene Every, MD 12/17/17 1108

## 2017-12-17 NOTE — ED Triage Notes (Signed)
Presents with URI sx's and headache  States headache is mainly behind eyes  Unsure of fever but has had chills

## 2018-04-25 ENCOUNTER — Emergency Department (HOSPITAL_COMMUNITY)
Admission: EM | Admit: 2018-04-25 | Discharge: 2018-04-25 | Disposition: A | Discharge status: Home / Self Care | Payer: BC Managed Care – PPO | Attending: Emergency Medicine | Admitting: Emergency Medicine

## 2018-04-25 ENCOUNTER — Emergency Department (HOSPITAL_COMMUNITY): Payer: BC Managed Care – PPO

## 2018-04-25 ENCOUNTER — Other Ambulatory Visit: Payer: Self-pay

## 2018-04-25 ENCOUNTER — Encounter (HOSPITAL_COMMUNITY): Payer: Self-pay | Admitting: *Deleted

## 2018-04-25 DIAGNOSIS — Z79899 Other long term (current) drug therapy: Secondary | ICD-10-CM | POA: Insufficient documentation

## 2018-04-25 DIAGNOSIS — Y9289 Other specified places as the place of occurrence of the external cause: Secondary | ICD-10-CM | POA: Insufficient documentation

## 2018-04-25 DIAGNOSIS — Y999 Unspecified external cause status: Secondary | ICD-10-CM | POA: Diagnosis not present

## 2018-04-25 DIAGNOSIS — S63682A Other sprain of left thumb, initial encounter: Secondary | ICD-10-CM | POA: Insufficient documentation

## 2018-04-25 DIAGNOSIS — Y9389 Activity, other specified: Secondary | ICD-10-CM | POA: Diagnosis not present

## 2018-04-25 DIAGNOSIS — I10 Essential (primary) hypertension: Secondary | ICD-10-CM | POA: Diagnosis not present

## 2018-04-25 DIAGNOSIS — S80212A Abrasion, left knee, initial encounter: Secondary | ICD-10-CM | POA: Insufficient documentation

## 2018-04-25 DIAGNOSIS — S6992XA Unspecified injury of left wrist, hand and finger(s), initial encounter: Secondary | ICD-10-CM | POA: Diagnosis present

## 2018-04-25 DIAGNOSIS — S63602A Unspecified sprain of left thumb, initial encounter: Secondary | ICD-10-CM

## 2018-04-25 MED ORDER — IBUPROFEN 200 MG PO TABS
600.0000 mg | ORAL_TABLET | Freq: Once | ORAL | Status: AC
  Administered 2018-04-25: 600 mg via ORAL
  Filled 2018-04-25: qty 1

## 2018-04-25 MED ORDER — MELOXICAM 7.5 MG PO TABS: 7.5000 mg | ORAL_TABLET | Freq: Every day | ORAL | 0 refills | Status: AC

## 2018-04-25 MED ORDER — ORPHENADRINE CITRATE ER 100 MG PO TB12: 100.0000 mg | ORAL_TABLET | Freq: Two times a day (BID) | ORAL | 0 refills | Status: AC

## 2018-04-25 NOTE — ED Notes (Signed)
Returned from xray

## 2018-04-25 NOTE — Progress Notes (Signed)
Orthopedic Tech Progress Note Patient Details:  Cathy Webster 1990-10-21 [id number]  Ortho Devices Type of Ortho Device: Thumb spica splint Ortho Device/Splint Interventions: Application   Post Interventions Patient Tolerated: Well Instructions Provided: Care of device   Cathy Webster 04/25/2018, 3:41 PM
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
## 2018-04-25 NOTE — ED Notes (Signed)
Waiting on ortho 
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
## 2018-04-25 NOTE — Discharge Instructions (Addendum)
Home to rest. Wear splint and follow up with orthopedics. Apply ice to left hand for 20 minutes at a time for pain and swelling. Meloxicam and Norflex as needed as prescribed, do not take Norflex if driving. Warm compresses to sore muscles.

## 2018-05-02 ENCOUNTER — Ambulatory Visit (INDEPENDENT_AMBULATORY_CARE_PROVIDER_SITE_OTHER): Payer: BC Managed Care – PPO | Admitting: Orthopaedic Surgery

## 2018-05-02 DIAGNOSIS — M79642 Pain in left hand: Secondary | ICD-10-CM | POA: Diagnosis not present

## 2018-05-02 MED ORDER — MELOXICAM 7.5 MG PO TABS: 15.0000 mg | ORAL_TABLET | Freq: Every day | ORAL | 2 refills | Status: DC | PRN
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
## 2018-06-26 ENCOUNTER — Inpatient Hospital Stay (HOSPITAL_COMMUNITY)
Admission: AD | Admit: 2018-06-26 | Discharge: 2018-06-26 | Disposition: A | Discharge status: Home / Self Care | Payer: BC Managed Care – PPO | Source: Ambulatory Visit | Attending: Obstetrics & Gynecology | Admitting: Obstetrics & Gynecology

## 2018-06-26 ENCOUNTER — Other Ambulatory Visit: Payer: Self-pay

## 2018-06-26 ENCOUNTER — Emergency Department
Admission: EM | Admit: 2018-06-26 | Discharge: 2018-06-26 | Disposition: A | Discharge status: Home / Self Care | Payer: BC Managed Care – PPO | Attending: Emergency Medicine | Admitting: Emergency Medicine

## 2018-06-26 DIAGNOSIS — R300 Dysuria: Secondary | ICD-10-CM

## 2018-06-26 DIAGNOSIS — Z3202 Encounter for pregnancy test, result negative: Secondary | ICD-10-CM | POA: Insufficient documentation

## 2018-06-26 DIAGNOSIS — N329 Bladder disorder, unspecified: Secondary | ICD-10-CM | POA: Insufficient documentation

## 2018-06-26 DIAGNOSIS — K219 Gastro-esophageal reflux disease without esophagitis: Secondary | ICD-10-CM

## 2018-06-26 DIAGNOSIS — R350 Frequency of micturition: Secondary | ICD-10-CM | POA: Diagnosis present

## 2018-06-26 DIAGNOSIS — B349 Viral infection, unspecified: Secondary | ICD-10-CM

## 2018-06-26 DIAGNOSIS — R1084 Generalized abdominal pain: Secondary | ICD-10-CM | POA: Diagnosis present

## 2018-06-26 DIAGNOSIS — I10 Essential (primary) hypertension: Secondary | ICD-10-CM | POA: Diagnosis not present

## 2018-06-26 DIAGNOSIS — R197 Diarrhea, unspecified: Secondary | ICD-10-CM

## 2018-06-26 DIAGNOSIS — N390 Urinary tract infection, site not specified: Secondary | ICD-10-CM

## 2018-06-26 DIAGNOSIS — K529 Noninfective gastroenteritis and colitis, unspecified: Secondary | ICD-10-CM | POA: Diagnosis not present

## 2018-06-26 DIAGNOSIS — Z975 Presence of (intrauterine) contraceptive device: Secondary | ICD-10-CM

## 2018-06-26 LAB — URINALYSIS, COMPLETE (UACMP) WITH MICROSCOPIC
Bacteria, UA: NONE SEEN
Bilirubin Urine: NEGATIVE
Glucose, UA: NEGATIVE mg/dL
Hgb urine dipstick: NEGATIVE
KETONES UR: NEGATIVE mg/dL
Leukocytes, UA: NEGATIVE
Nitrite: NEGATIVE
PH: 5 (ref 5.0–8.0)
Protein, ur: NEGATIVE mg/dL
SPECIFIC GRAVITY, URINE: 1.028 (ref 1.005–1.030)

## 2018-06-26 LAB — URINALYSIS, ROUTINE W REFLEX MICROSCOPIC
BILIRUBIN URINE: NEGATIVE
GLUCOSE, UA: NEGATIVE mg/dL
HGB URINE DIPSTICK: NEGATIVE
Ketones, ur: NEGATIVE mg/dL
Leukocytes, UA: NEGATIVE
Nitrite: NEGATIVE
PROTEIN: NEGATIVE mg/dL
SPECIFIC GRAVITY, URINE: 1.027 (ref 1.005–1.030)
pH: 6 (ref 5.0–8.0)

## 2018-06-26 LAB — COMPREHENSIVE METABOLIC PANEL
ALK PHOS: 49 U/L (ref 38–126)
ALT: 16 U/L (ref 0–44)
AST: 18 U/L (ref 15–41)
Albumin: 4 g/dL (ref 3.5–5.0)
Anion gap: 7 (ref 5–15)
BUN: 7 mg/dL (ref 6–20)
CHLORIDE: 106 mmol/L (ref 98–111)
CO2: 25 mmol/L (ref 22–32)
CREATININE: 0.48 mg/dL (ref 0.44–1.00)
Calcium: 8.7 mg/dL — ABNORMAL LOW (ref 8.9–10.3)
GFR calc Af Amer: >60 mL/min (ref >60)
GFR calc non Af Amer: >60 mL/min (ref >60)
GLUCOSE: 89 mg/dL (ref 70–99)
Potassium: 3.7 mmol/L (ref 3.5–5.1)
SODIUM: 138 mmol/L (ref 135–145)
Total Bilirubin: 0.5 mg/dL (ref 0.3–1.2)
Total Protein: 8 g/dL (ref 6.5–8.1)

## 2018-06-26 LAB — WET PREP, GENITAL
Clue Cells Wet Prep HPF POC: NONE SEEN
SPERM: NONE SEEN
Trich, Wet Prep: NONE SEEN
YEAST WET PREP: NONE SEEN

## 2018-06-26 LAB — CBC
HCT: 37.6 % (ref 35.0–47.0)
Hemoglobin: 13 g/dL (ref 12.0–16.0)
MCH: 29.4 pg (ref 26.0–34.0)
MCHC: 34.5 g/dL (ref 32.0–36.0)
MCV: 85.3 fL (ref 80.0–100.0)
PLATELETS: 195 10*3/uL (ref 150–440)
RBC: 4.41 MIL/uL (ref 3.80–5.20)
RDW: 13.5 % (ref 11.5–14.5)
WBC: 6 10*3/uL (ref 3.6–11.0)

## 2018-06-26 LAB — POCT PREGNANCY, URINE
PREG TEST UR: NEGATIVE
Preg Test, Ur: NEGATIVE

## 2018-06-26 LAB — LIPASE, BLOOD: Lipase: 25 U/L (ref 11–51)

## 2018-06-26 MED ORDER — PHENAZOPYRIDINE HCL 200 MG PO TABS
200.0000 mg | ORAL_TABLET | Freq: Once | ORAL | Status: AC
  Administered 2018-06-26: 200 mg via ORAL
  Filled 2018-06-26 (×2): qty 1

## 2018-06-26 MED ORDER — PHENAZOPYRIDINE HCL 200 MG PO TABS: 200.0000 mg | ORAL_TABLET | Freq: Three times a day (TID) | ORAL | 0 refills | Status: DC | PRN

## 2018-06-26 MED ORDER — SUCRALFATE 1 G PO TABS: 1.0000 g | ORAL_TABLET | Freq: Four times a day (QID) | ORAL | 1 refills | Status: DC

## 2018-06-26 MED ORDER — FAMOTIDINE 20 MG PO TABS
20.0000 mg | ORAL_TABLET | Freq: Once | ORAL | Status: AC
  Administered 2018-06-26: 20 mg via ORAL
  Filled 2018-06-26: qty 1

## 2018-06-26 MED ORDER — GI COCKTAIL ~~LOC~~
30.0000 mL | Freq: Once | ORAL | Status: AC
  Administered 2018-06-26: 30 mL via ORAL
  Filled 2018-06-26: qty 30

## 2018-06-26 MED ORDER — FAMOTIDINE 20 MG PO TABS: 20.0000 mg | ORAL_TABLET | Freq: Two times a day (BID) | ORAL | 1 refills | Status: DC

## 2018-06-26 NOTE — ED Triage Notes (Addendum)
Pt c/o epigastric and lower abd pain and back pain since Monday, diarrhea in the past 3 days, increased urgency and frequency of urination. Pt states [facility], states they didn't even ask what her sx were and had her swab herself.

## 2018-06-26 NOTE — MAU Provider Note (Signed)
History     CSN: [id number]  Arrival date and time: 06/26/18 1013   First Provider Initiated Contact with Patient 06/26/18 1133      Chief Complaint  Patient presents with  . Urinary Tract Infection   HPI Cathy Webster is a 27 y.o. [phone] non pregnant patient who presents to MAU with chief complaint of pressure with voiding as well as loose stools for the past three days. Denies pain, fever, falls. Endorses PO hydration with "about three cups of water each day". Denies intercourse or concern for STI. Liletta IUD placed 04/2016.  OB History    Gravida  4   Para  4   Term  4   Preterm      AB      Living  4     SAB      TAB      Ectopic      Multiple  0   Live Births  4           Past Medical History:  Diagnosis Date  . Abnormal Pap smear 2008  . Cystitis 04/09/16  . Hives   . Hypertension   . Preeclampsia   . UTI (lower urinary tract infection)     Past Surgical History:  Procedure Laterality Date  . CESAREAN SECTION    . TONSILLECTOMY      Family History  Problem Relation Age of Onset  . Diabetes Mother   . Hypertension Mother   . Asthma Brother     Social History   Tobacco Use  . Smoking status: Never Smoker  . Smokeless tobacco: Never Used  Substance Use Topics  . Alcohol use: No  . Drug use: No    Allergies: No Known Allergies  Medications Prior to Admission  Medication Sig Dispense Refill Last Dose  . butalbital-acetaminophen-caffeine (FIORICET, ESGIC) 50-325-40 MG tablet Take 1 tablet by mouth every 6 (six) hours as needed for headache. 12 tablet 0   . fluticasone (FLONASE) 50 MCG/ACT nasal spray Place 1 spray into both nostrils daily. 16 g 2   . meloxicam (MOBIC) 7.5 MG tablet Take 2 tablets (15 mg total) by mouth daily as needed for pain. 30 tablet 2   . ondansetron (ZOFRAN) 4 MG tablet Take 1 tablet (4 mg total) by mouth daily as needed for nausea or vomiting. 10 tablet 0   . pseudoephedrine (SUDAFED) 30 MG tablet Take  1 tablet (30 mg total) by mouth every 6 (six) hours as needed for congestion. 24 tablet 2     Review of Systems  Constitutional: Negative for fatigue and fever.  Gastrointestinal: Positive for abdominal pain and diarrhea. Negative for nausea and vomiting.  Genitourinary: Negative for difficulty urinating, dyspareunia, vaginal bleeding, vaginal discharge and vaginal pain.       "pressure with peeing but no pain"  Neurological: Negative for headaches.   Physical Exam   Blood pressure (!) 142/78, pulse 76, temperature 98.1 F (36.7 C), resp. rate 16.  Physical Exam  Nursing note and vitals reviewed. Constitutional: She is oriented to person, place, and time. She appears well-developed and well-nourished.  Cardiovascular: Normal rate, normal heart sounds and intact distal pulses.  Respiratory: Effort normal. No respiratory distress.  GI: Soft. Bowel sounds are normal. She exhibits no distension and no mass. There is no tenderness. There is no rebound and no guarding.  Genitourinary:  Genitourinary Comments: Not assessed based on chief complaint  Neurological: She is alert and oriented to person, place, and  time. She has normal reflexes.  Skin: Skin is warm and dry.  Psychiatric: She has a normal mood and affect. Her behavior is normal. Judgment and thought content normal.    MAU Course  Procedures  MDM --negative pregnancy test today --no concerning signs on physical exam --Patient verbalizes desire to be triaged at [facility] ED. Discussed that she is not acutely ill and is stable to either seek care at urgent care or establish care with PCP based on today's presentation and assessment.  Patient Vitals for the past 24 hrs:  BP Temp Pulse Resp  06/26/18 1142 (!) 142/78 98.1 F (36.7 C) 76 16    Orders Placed This Encounter  Procedures  . Wet prep, genital  . Urinalysis, Routine w reflex microscopic  . Pregnancy, urine POC  . Discharge patient   Results for orders placed or  performed during the hospital encounter of 06/26/18 (from the past 24 hour(s))  Urinalysis, Routine w reflex microscopic     Status: Abnormal   Collection Time: 06/26/18 10:40 AM  Result Value Ref Range   Color, Urine YELLOW YELLOW   APPearance HAZY (A) CLEAR   Specific Gravity, Urine 1.027 1.005 - 1.030   pH 6.0 5.0 - 8.0   Glucose, UA NEGATIVE NEGATIVE mg/dL   Hgb urine dipstick NEGATIVE NEGATIVE   Bilirubin Urine NEGATIVE NEGATIVE   Ketones, ur NEGATIVE NEGATIVE mg/dL   Protein, ur NEGATIVE NEGATIVE mg/dL   Nitrite NEGATIVE NEGATIVE   Leukocytes, UA NEGATIVE NEGATIVE  Wet prep, genital     Status: Abnormal   Collection Time: 06/26/18 10:40 AM  Result Value Ref Range   Yeast Wet Prep HPF POC NONE SEEN NONE SEEN   Trich, Wet Prep NONE SEEN NONE SEEN   Clue Cells Wet Prep HPF POC NONE SEEN NONE SEEN   WBC, Wet Prep HPF POC FEW (A) NONE SEEN   Sperm NONE SEEN   Pregnancy, urine POC     Status: None   Collection Time: 06/26/18 11:19 AM  Result Value Ref Range   Preg Test, Ur NEGATIVE NEGATIVE    Assessment and Plan  --27 y.o. [id number] non pregnant patient --No concerning findings today --Confirmed her complaint is not specific to GYN specialty and she can pursue outpatient management with PCP --Encouraged PO hydration with water to increase to 64 oz per day --Review hygiene regimen to analyze for irritants --Discharge home in stable condition  Calvert Cantor, CNM 06/26/2018, 11:43 AM

## 2018-06-26 NOTE — Discharge Instructions (Signed)
Diarrhea, Adult °Diarrhea is when you have loose and water poop (stool) often. Diarrhea can make you feel weak and cause you to get dehydrated. Dehydration can make you tired and thirsty, make you have a dry mouth, and make it so you pee (urinate) less often. Diarrhea often lasts 2-3 days. However, it can last longer if it is a sign of something more serious. It is important to treat your diarrhea as told by your doctor. °Follow these instructions at home: °Eating and drinking ° °Follow these recommendations as told by your doctor: °· Take an oral rehydration solution (ORS). This is a drink that is sold at pharmacies and stores. °· Drink clear fluids, such as: °? Water. °? Ice chips. °? Diluted fruit juice. °? Low-calorie sports drinks. °· Eat bland, easy-to-digest foods in small amounts as you are able. These foods include: °? Bananas. °? Applesauce. °? Rice. °? Low-fat (lean) meats. °? Toast. °? Crackers. °· Avoid drinking fluids that have a lot of sugar or caffeine in them. °· Avoid alcohol. °· Avoid spicy or fatty foods. ° °General instructions ° °· Drink enough fluid to keep your pee (urine) clear or pale yellow. °· Wash your hands often. If you cannot use soap and water, use hand sanitizer. °· Make sure that all people in your home wash their hands well and often. °· Take over-the-counter and prescription medicines only as told by your doctor. °· Rest at home while you get better. °· Watch your condition for any changes. °· Take a warm bath to help with any burning or pain from having diarrhea. °· Keep all follow-up visits as told by your doctor. This is important. °Contact a doctor if: °· You have a fever. °· Your diarrhea gets worse. °· You have new symptoms. °· You cannot keep fluids down. °· You feel light-headed or dizzy. °· You have a headache. °· You have muscle cramps. °Get help right away if: °· You have chest pain. °· You feel very weak or you pass out (faint). °· You have bloody or black poop or  poop that look like tar. °· You have very bad pain, cramping, or bloating in your belly (abdomen). °· You have trouble breathing or you are breathing very quickly. °· Your heart is beating very quickly. °· Your skin feels cold and clammy. °· You feel confused. °· You have signs of dehydration, such as: °? Dark pee, hardly any pee, or no pee. °? Cracked lips. °? Dry mouth. °? Sunken eyes. °? Sleepiness. °? Weakness. °This information is not intended to replace advice given to you by your health care provider. Make sure you discuss any questions you have with your health care provider. °Document Released: 03/12/2008 Document Revised: 04/13/2016 Document Reviewed: 05/31/2015 °Elsevier Interactive Patient Education © 2018 [employer] ° °

## 2018-06-26 NOTE — MAU Note (Signed)
Pt presents to MAU with diarrhea and urine frequency.

## 2018-06-26 NOTE — ED Provider Notes (Signed)
[facility] Emergency Department Provider Note       Time seen: ----------------------------------------- 2:34 PM on 06/26/2018 -----------------------------------------   I have reviewed the triage vital signs and the nursing notes.  HISTORY   Chief Complaint Abdominal Pain    HPI Cathy Webster is a 27 y.o. female with a history of cystitis, hypertension, preeclampsia who presents to the ED for abdominal pain and low back pain since Monday.  Patient has had diarrhea for the past 3 days but she is also had increased urgency and frequency of urination.  Patient [facility] had her do a vaginal swab herself but she decided to come here for evaluation.  Past Medical History:  Diagnosis Date  . Abnormal Pap smear 2008  . Cystitis 04/09/16  . Hives   . Hypertension   . Preeclampsia   . UTI (lower urinary tract infection)     Patient Active Problem List   Diagnosis Date Noted  . Contraceptive management 04/09/2016  . Preeclampsia in postpartum period 03/01/2016  . Abnormal Pap smear of cervix 12/22/2013    Past Surgical History:  Procedure Laterality Date  . CESAREAN SECTION    . TONSILLECTOMY      Allergies Patient has no known allergies.  Social History Social History   Tobacco Use  . Smoking status: Never Smoker  . Smokeless tobacco: Never Used  Substance Use Topics  . Alcohol use: No  . Drug use: No   Review of Systems Constitutional: Negative for fever. Cardiovascular: Negative for chest pain. Respiratory: Negative for shortness of breath. Gastrointestinal: Positive for abdominal pain, diarrhea Genitourinary: Positive for dysuria, polyuria Musculoskeletal: Positive for back pain Skin: Negative for rash. Neurological: Negative for headaches, focal weakness or numbness.  All systems negative/normal/unremarkable except as stated in the  HPI  ____________________________________________   PHYSICAL EXAM:  VITAL SIGNS: ED Triage Vitals  Enc Vitals Group     BP 06/26/18 1239 136/86     Pulse Rate 06/26/18 1239 82     Resp 06/26/18 1239 17     Temp 06/26/18 1239 98.6 F (37 C)     Temp src --      SpO2 06/26/18 1239 99 %     Weight 06/26/18 1243 230 lb (104.3 kg)     Height 06/26/18 1243 5\' 6"  (1.676 m)     Head Circumference --      Peak Flow --      Pain Score 06/26/18 1241 0     Pain Loc --      Pain Edu? --      Excl. in GC? --    Constitutional: Alert and oriented. Well appearing and in no distress. Eyes: Conjunctivae are normal. Normal extraocular movements. ENT   Head: Normocephalic and atraumatic.   Nose: No congestion/rhinnorhea.   Mouth/Throat: Mucous membranes are moist.   Neck: No stridor. Cardiovascular: Normal rate, regular rhythm. No murmurs, rubs, or gallops. Respiratory: Normal respiratory effort without tachypnea nor retractions. Breath sounds are clear and equal bilaterally. No wheezes/rales/rhonchi. Gastrointestinal: Epigastric tenderness, no rebound or guarding.  Normal bowel sounds. Musculoskeletal: Nontender with normal range of motion in extremities. No lower extremity tenderness nor edema. Neurologic:  Normal speech and language. No gross focal neurologic deficits are appreciated.  Skin:  Skin is warm, dry and intact. No rash noted. ____________________________________________  ED COURSE:  As part of my medical decision making, I reviewed the following data within the electronic MEDICAL RECORD NUMBER  History obtained from family if available, nursing notes, old chart and ekg, as well as notes from prior ED visits. Patient presented for abdominal pain, we will assess with labs and imaging as indicated at this time.   Procedures ____________________________________________   LABS (pertinent positives/negatives)  Labs Reviewed  COMPREHENSIVE METABOLIC PANEL - Abnormal;  Notable for the following components:      Result Value   Calcium 8.7 (*)    All other components within normal limits  URINALYSIS, COMPLETE (UACMP) WITH MICROSCOPIC - Abnormal; Notable for the following components:   Color, Urine YELLOW (*)    APPearance CLEAR (*)    All other components within normal limits  LIPASE, BLOOD  CBC  POCT PREGNANCY, URINE  POC URINE PREG, ED   ____________________________________________  DIFFERENTIAL DIAGNOSIS   Gastroenteritis, dehydration, electrolyte abnormality, hyperglycemia, UTI, PID  FINAL ASSESSMENT AND PLAN  Abdominal pain, diarrhea, bladder spasms   Plan: The patient had presented for multiple complaints. Patient's labs are all reassuring with no indication of UTI.  Patient likely has a viral illness in addition has developed some GERD.  She was given a GI cocktail as well as Pepcid and will be discharged home with similar.  Ulice Dash, MD   Note: This note was generated in part or whole with voice recognition software. Voice recognition is usually quite accurate but there are transcription errors that can and very often do occur. I apologize for any typographical errors that were not detected and corrected.     Emily Filbert, MD 06/26/18 [phone]

## 2018-06-27 LAB — GC/CHLAMYDIA PROBE AMP (~~LOC~~) NOT AT ARMC
CHLAMYDIA, DNA PROBE: NEGATIVE
Neisseria Gonorrhea: NEGATIVE

## 2018-07-10 ENCOUNTER — Ambulatory Visit (HOSPITAL_COMMUNITY): Payer: BC Managed Care – PPO

## 2018-09-01 ENCOUNTER — Ambulatory Visit: Payer: Self-pay | Admitting: Family Medicine

## 2018-09-17 ENCOUNTER — Emergency Department (HOSPITAL_COMMUNITY)
Admission: EM | Admit: 2018-09-17 | Discharge: 2018-09-17 | Disposition: A | Discharge status: Home / Self Care | Payer: BC Managed Care – PPO | Attending: Emergency Medicine | Admitting: Emergency Medicine

## 2018-09-17 ENCOUNTER — Encounter (HOSPITAL_COMMUNITY): Payer: Self-pay | Admitting: *Deleted

## 2018-09-17 DIAGNOSIS — J45909 Unspecified asthma, uncomplicated: Secondary | ICD-10-CM | POA: Insufficient documentation

## 2018-09-17 DIAGNOSIS — A084 Viral intestinal infection, unspecified: Secondary | ICD-10-CM

## 2018-09-17 DIAGNOSIS — R350 Frequency of micturition: Secondary | ICD-10-CM | POA: Diagnosis not present

## 2018-09-17 LAB — CBC WITH DIFFERENTIAL/PLATELET
Abs Immature Granulocytes: 0 10*3/uL (ref 0.00–0.07)
Basophils Absolute: 0 10*3/uL (ref 0.0–0.1)
Basophils Relative: 0 %
Eosinophils Absolute: 0.1 10*3/uL (ref 0.0–0.5)
Eosinophils Relative: 2 %
HCT: 39.2 % (ref 36.0–46.0)
Hemoglobin: 12.2 g/dL (ref 12.0–15.0)
Immature Granulocytes: 0 %
Lymphocytes Relative: 42 %
Lymphs Abs: 1.7 10*3/uL (ref 0.7–4.0)
MCH: 27.7 pg (ref 26.0–34.0)
MCHC: 31.1 g/dL (ref 30.0–36.0)
MCV: 88.9 fL (ref 80.0–100.0)
Monocytes Absolute: 0.3 10*3/uL (ref 0.1–1.0)
Monocytes Relative: 7 %
Neutro Abs: 2.1 10*3/uL (ref 1.7–7.7)
Neutrophils Relative %: 49 %
Platelets: 204 10*3/uL (ref 150–400)
RBC: 4.41 MIL/uL (ref 3.87–5.11)
RDW: 12.8 % (ref 11.5–15.5)
WBC: 4.1 10*3/uL (ref 4.0–10.5)
nRBC: 0 % (ref 0.0–0.2)

## 2018-09-17 LAB — COMPREHENSIVE METABOLIC PANEL
ALT: 12 U/L (ref 0–44)
AST: 16 U/L (ref 15–41)
Albumin: 3.6 g/dL (ref 3.5–5.0)
Alkaline Phosphatase: 40 U/L (ref 38–126)
Anion gap: 9 (ref 5–15)
BUN: <5 mg/dL — ABNORMAL LOW (ref 6–20)
CO2: 23 mmol/L (ref 22–32)
Calcium: 8.9 mg/dL (ref 8.9–10.3)
Chloride: 106 mmol/L (ref 98–111)
Creatinine, Ser: 0.66 mg/dL (ref 0.44–1.00)
GFR calc Af Amer: >60 mL/min (ref >60)
GFR calc non Af Amer: >60 mL/min (ref >60)
Glucose, Bld: 89 mg/dL (ref 70–99)
Potassium: 3.7 mmol/L (ref 3.5–5.1)
Sodium: 138 mmol/L (ref 135–145)
Total Bilirubin: 0.4 mg/dL (ref 0.3–1.2)
Total Protein: 7.2 g/dL (ref 6.5–8.1)

## 2018-09-17 LAB — LIPASE, BLOOD: Lipase: 21 U/L (ref 11–51)

## 2018-09-17 MED ORDER — LOPERAMIDE HCL 2 MG PO CAPS: 2.0000 mg | ORAL_CAPSULE | Freq: Four times a day (QID) | ORAL | 0 refills | Status: DC | PRN

## 2018-09-17 NOTE — Discharge Instructions (Addendum)
Please read attached information. If you experience any new or worsening signs or symptoms please return to the emergency room for evaluation. Please follow-up with your primary care provider or specialist as discussed. Please use medication prescribed only as directed and discontinue taking if you have any concerning signs or symptoms.   °

## 2018-09-17 NOTE — ED Triage Notes (Signed)
Pt in c/o generalized abd pain with diarrhea for the last two days, denies n/v, denies dysuria or vaginal discharge, no distress noted

## 2018-09-17 NOTE — ED Provider Notes (Signed)
[facility] EMERGENCY DEPARTMENT Provider Note   CSN: [id number] Arrival date & time: 09/17/18  0845     History   Chief Complaint Chief Complaint  Patient presents with  . Abdominal Pain    HPI Cathy Webster is a 27 y.o. female.  HPI   27 year old female presents today with complaints of abdominal pain.  Patient notes approximate 2 days ago she developed a sharp pain in her mid abdomen.  She notes the sensation that she needed to have a bowel movement.  She notes several episodes of diarrhea daily with the associated sharp mid abdominal pain.  Patient denies any associated fever nausea or vomiting.  She notes a history of the same approximately 2 months ago with no acute findings.  She denies any abnormal food or drink, she denies any close sick contacts.  She has not taken any medications prior to arrival today.  She denies any vaginal discharge bleeding or dysuria.  No blood in her diarrhea.  Past Medical History:  Diagnosis Date  . Abnormal Pap smear 2008  . Cystitis 04/09/16  . Hives   . Hypertension   . Preeclampsia   . UTI (lower urinary tract infection)     Patient Active Problem List   Diagnosis Date Noted  . Contraceptive management 04/09/2016  . Preeclampsia in postpartum period 03/01/2016  . Abnormal Pap smear of cervix 12/22/2013    Past Surgical History:  Procedure Laterality Date  . CESAREAN SECTION    . TONSILLECTOMY       OB History    Gravida  4   Para  4   Term  4   Preterm      AB      Living  4     SAB      TAB      Ectopic      Multiple  0   Live Births  4            Home Medications    Prior to Admission medications   Medication Sig Start Date End Date Taking? Authorizing Provider  butalbital-acetaminophen-caffeine (FIORICET, ESGIC) 50-325-40 MG tablet Take 1 tablet by mouth every 6 (six) hours as needed for headache. 12/17/17 12/17/18  Triplett, Rulon Eisenmenger B, FNP  famotidine (PEPCID) 20 MG tablet  Take 1 tablet (20 mg total) by mouth 2 (two) times daily. 06/26/18   Emily Filbert, MD  fluticasone (FLONASE) 50 MCG/ACT nasal spray Place 1 spray into both nostrils daily. 07/29/17 07/29/18  Orvil Feil, PA-C  loperamide (IMODIUM) 2 MG capsule Take 1 capsule (2 mg total) by mouth 4 (four) times daily as needed for diarrhea or loose stools. 09/17/18   Wyat Infinger, Tinnie Gens, PA-C  meloxicam (MOBIC) 7.5 MG tablet Take 2 tablets (15 mg total) by mouth daily as needed for pain. 05/02/18   Tarry Kos, MD  ondansetron (ZOFRAN) 4 MG tablet Take 1 tablet (4 mg total) by mouth daily as needed for nausea or vomiting. 02/03/17   Jeanmarie Plant, MD  phenazopyridine (PYRIDIUM) 200 MG tablet Take 1 tablet (200 mg total) by mouth 3 (three) times daily as needed for pain. 06/26/18 06/26/19  Emily Filbert, MD  sucralfate (CARAFATE) 1 g tablet Take 1 tablet (1 g total) by mouth 4 (four) times daily. 06/26/18 06/26/19  Emily Filbert, MD    Family History Family History  Problem Relation Age of Onset  . Diabetes Mother   . Hypertension Mother   .  Asthma Brother     Social History Social History   Tobacco Use  . Smoking status: Never Smoker  . Smokeless tobacco: Never Used  Substance Use Topics  . Alcohol use: No  . Drug use: No     Allergies   Patient has no known allergies.   Review of Systems Review of Systems  All other systems reviewed and are negative.    Physical Exam Updated Vital Signs BP 131/73 (BP Location: Right Arm)   Pulse 86   Temp 98.1 F (36.7 C) (Oral)   Resp 16   SpO2 100%   Physical Exam  Constitutional: She is oriented to person, place, and time. She appears well-developed and well-nourished.  HENT:  Head: Normocephalic and atraumatic.  Eyes: Pupils are equal, round, and reactive to light. Conjunctivae are normal. Right eye exhibits no discharge. Left eye exhibits no discharge. No scleral icterus.  Neck: Normal range of motion. No JVD present. No  tracheal deviation present.  Pulmonary/Chest: Effort normal. No stridor.  Abdominal:  Minor tenderness palpation of the periumbilical left lower quadrant, very minimal tenderness to palpation right lower quadrant-abnormal soft with no rebound guarding or masses noted  Neurological: She is alert and oriented to person, place, and time. Coordination normal.  Psychiatric: She has a normal mood and affect. Her behavior is normal. Judgment and thought content normal.  Nursing note and vitals reviewed.   ED Treatments / Results  Labs (all labs ordered are listed, but only abnormal results are displayed) Labs Reviewed  COMPREHENSIVE METABOLIC PANEL - Abnormal; Notable for the following components:      Result Value   BUN <5 (*)    All other components within normal limits  CBC WITH DIFFERENTIAL/PLATELET  LIPASE, BLOOD    EKG None  Radiology No results found.  Procedures Procedures (including critical care time)  Medications Ordered in ED Medications - No data to display   Initial Impression / Assessment and Plan / ED Course  I have reviewed the triage vital signs and the nursing notes.  Pertinent labs & imaging results that were available during my care of the patient were reviewed by me and considered in my medical decision making (see chart for details).      Assessment/Plan: 27 year old female presents today with abdominal pain most consistent with viral gastroenteritis.  She has some minor abdominal tenderness palpation.  Is been 2 days of symptoms with no fever or elevated white count.  I have very low suspicion for any acute intra-abdominal pathology including diverticulitis acute appendicitis.  Patient treated symptomatically with strict return precautions.  She verbalized understanding and agreement to today's plan.    Final Clinical Impressions(s) / ED Diagnoses   Final diagnoses:  Viral gastroenteritis    ED Discharge Orders         Ordered    loperamide  (IMODIUM) 2 MG capsule  4 times daily PRN,   Status:  Discontinued     09/17/18 1037    loperamide (IMODIUM) 2 MG capsule  4 times daily PRN     09/17/18 1040           Eyvonne MechanicHedges, Kearie Mennen, PA-C 09/17/18 1305    Arby BarrettePfeiffer, Marcy, MD 09/18/18 0710

## 2018-09-26 ENCOUNTER — Inpatient Hospital Stay (HOSPITAL_COMMUNITY): Payer: BC Managed Care – PPO | Admitting: Anesthesiology

## 2018-09-26 ENCOUNTER — Inpatient Hospital Stay (HOSPITAL_COMMUNITY): Payer: BC Managed Care – PPO

## 2018-09-26 ENCOUNTER — Encounter (HOSPITAL_COMMUNITY): Payer: Self-pay

## 2018-09-26 ENCOUNTER — Encounter (HOSPITAL_COMMUNITY): Payer: Self-pay | Admitting: *Deleted

## 2018-09-26 ENCOUNTER — Ambulatory Visit (HOSPITAL_COMMUNITY)
Admission: AD | Admit: 2018-09-26 | Discharge: 2018-09-26 | Disposition: A | Discharge status: Home / Self Care | Payer: BC Managed Care – PPO | Source: Ambulatory Visit | Attending: Family Medicine | Admitting: Family Medicine

## 2018-09-26 ENCOUNTER — Encounter (HOSPITAL_COMMUNITY)
Admission: AD | Disposition: A | Discharge status: Home / Self Care | Payer: Self-pay | Source: Ambulatory Visit | Attending: Family Medicine

## 2018-09-26 ENCOUNTER — Ambulatory Visit (INDEPENDENT_AMBULATORY_CARE_PROVIDER_SITE_OTHER)
Admission: EM | Admit: 2018-09-26 | Discharge: 2018-09-26 | Disposition: A | Discharge status: Home / Self Care | Payer: BC Managed Care – PPO | Source: Home / Self Care | Attending: Internal Medicine | Admitting: Internal Medicine

## 2018-09-26 DIAGNOSIS — Z825 Family history of asthma and other chronic lower respiratory diseases: Secondary | ICD-10-CM | POA: Diagnosis not present

## 2018-09-26 DIAGNOSIS — K660 Peritoneal adhesions (postprocedural) (postinfection): Secondary | ICD-10-CM | POA: Diagnosis not present

## 2018-09-26 DIAGNOSIS — Z833 Family history of diabetes mellitus: Secondary | ICD-10-CM | POA: Diagnosis not present

## 2018-09-26 DIAGNOSIS — E669 Obesity, unspecified: Secondary | ICD-10-CM | POA: Diagnosis not present

## 2018-09-26 DIAGNOSIS — Z79899 Other long term (current) drug therapy: Secondary | ICD-10-CM | POA: Insufficient documentation

## 2018-09-26 DIAGNOSIS — Z791 Long term (current) use of non-steroidal anti-inflammatories (NSAID): Secondary | ICD-10-CM | POA: Insufficient documentation

## 2018-09-26 DIAGNOSIS — Z975 Presence of (intrauterine) contraceptive device: Secondary | ICD-10-CM | POA: Diagnosis not present

## 2018-09-26 DIAGNOSIS — K219 Gastro-esophageal reflux disease without esophagitis: Secondary | ICD-10-CM | POA: Diagnosis not present

## 2018-09-26 DIAGNOSIS — K6289 Other specified diseases of anus and rectum: Secondary | ICD-10-CM | POA: Diagnosis not present

## 2018-09-26 DIAGNOSIS — O009 Unspecified ectopic pregnancy without intrauterine pregnancy: Secondary | ICD-10-CM | POA: Insufficient documentation

## 2018-09-26 DIAGNOSIS — R1032 Left lower quadrant pain: Secondary | ICD-10-CM

## 2018-09-26 DIAGNOSIS — O00102 Left tubal pregnancy without intrauterine pregnancy: Secondary | ICD-10-CM

## 2018-09-26 DIAGNOSIS — Z8759 Personal history of other complications of pregnancy, childbirth and the puerperium: Secondary | ICD-10-CM | POA: Insufficient documentation

## 2018-09-26 DIAGNOSIS — Z6841 Body Mass Index (BMI) 40.0 and over, adult: Secondary | ICD-10-CM | POA: Insufficient documentation

## 2018-09-26 DIAGNOSIS — O209 Hemorrhage in early pregnancy, unspecified: Secondary | ICD-10-CM

## 2018-09-26 DIAGNOSIS — I10 Essential (primary) hypertension: Secondary | ICD-10-CM | POA: Diagnosis not present

## 2018-09-26 DIAGNOSIS — Z8249 Family history of ischemic heart disease and other diseases of the circulatory system: Secondary | ICD-10-CM | POA: Insufficient documentation

## 2018-09-26 HISTORY — PX: LAPAROSCOPY: SHX197

## 2018-09-26 LAB — HCG, QUANTITATIVE, PREGNANCY: hCG, Beta Chain, Quant, S: 629 m[IU]/mL — ABNORMAL HIGH (ref <5)

## 2018-09-26 LAB — CBC
HCT: 34.9 % — ABNORMAL LOW (ref 36.0–46.0)
HEMOGLOBIN: 11.5 g/dL — AB (ref 12.0–15.0)
MCH: 28.8 pg (ref 26.0–34.0)
MCHC: 33 g/dL (ref 30.0–36.0)
MCV: 87.3 fL (ref 80.0–100.0)
NRBC: 0 % (ref 0.0–0.2)
Platelets: 177 10*3/uL (ref 150–400)
RBC: 4 MIL/uL (ref 3.87–5.11)
RDW: 12.9 % (ref 11.5–15.5)
WBC: 6.1 10*3/uL (ref 4.0–10.5)

## 2018-09-26 LAB — TYPE AND SCREEN
ABO/RH(D): O POS
ANTIBODY SCREEN: NEGATIVE

## 2018-09-26 LAB — POCT URINALYSIS DIP (DEVICE)
BILIRUBIN URINE: NEGATIVE
Glucose, UA: NEGATIVE mg/dL
KETONES UR: NEGATIVE mg/dL
Leukocytes, UA: NEGATIVE
Nitrite: NEGATIVE
Protein, ur: 100 mg/dL — AB
Urobilinogen, UA: 1 mg/dL (ref 0.0–1.0)
pH: 6 (ref 5.0–8.0)

## 2018-09-26 LAB — POCT PREGNANCY, URINE: PREG TEST UR: POSITIVE — AB

## 2018-09-26 SURGERY — LAPAROSCOPY OPERATIVE
Anesthesia: General | Site: Abdomen

## 2018-09-26 MED ORDER — HYDROMORPHONE HCL 1 MG/ML IJ SOLN
INTRAMUSCULAR | Status: AC
  Filled 2018-09-26: qty 1

## 2018-09-26 MED ORDER — SUCCINYLCHOLINE CHLORIDE 20 MG/ML IJ SOLN
INTRAMUSCULAR | Status: DC | PRN
  Administered 2018-09-26: 140 mg via INTRAVENOUS

## 2018-09-26 MED ORDER — LIDOCAINE HCL (CARDIAC) PF 100 MG/5ML IV SOSY
PREFILLED_SYRINGE | INTRAVENOUS | Status: DC | PRN
  Administered 2018-09-26: 40 mg via INTRAVENOUS

## 2018-09-26 MED ORDER — ROCURONIUM BROMIDE 100 MG/10ML IV SOLN
INTRAVENOUS | Status: DC | PRN
  Administered 2018-09-26: 30 mg via INTRAVENOUS

## 2018-09-26 MED ORDER — FENTANYL CITRATE (PF) 100 MCG/2ML IJ SOLN
INTRAMUSCULAR | Status: DC | PRN
  Administered 2018-09-26 (×3): 100 ug via INTRAVENOUS
  Administered 2018-09-27: 50 ug via INTRAVENOUS

## 2018-09-26 MED ORDER — SODIUM CHLORIDE 0.9 % IR SOLN
Status: DC | PRN
  Administered 2018-09-26: 3000 mL

## 2018-09-26 MED ORDER — HYDROMORPHONE HCL 1 MG/ML IJ SOLN
INTRAMUSCULAR | Status: DC | PRN
  Administered 2018-09-26 (×2): 0.5 mg via INTRAVENOUS

## 2018-09-26 MED ORDER — PROPOFOL 10 MG/ML IV BOLUS
INTRAVENOUS | Status: DC | PRN
  Administered 2018-09-26: 160 mg via INTRAVENOUS
  Administered 2018-09-27: 40 mg via INTRAVENOUS

## 2018-09-26 MED ORDER — LACTATED RINGERS IV BOLUS: 1000.0000 mL | Freq: Once | INTRAVENOUS | Status: DC

## 2018-09-26 MED ORDER — BUPIVACAINE HCL (PF) 0.25 % IJ SOLN
INTRAMUSCULAR | Status: AC
  Filled 2018-09-26: qty 30

## 2018-09-26 MED ORDER — GLYCOPYRROLATE 0.2 MG/ML IJ SOLN
INTRAMUSCULAR | Status: DC | PRN
  Administered 2018-09-26 – 2018-09-27 (×2): 0.1 mg via INTRAVENOUS

## 2018-09-26 MED ORDER — FENTANYL CITRATE (PF) 250 MCG/5ML IJ SOLN
INTRAMUSCULAR | Status: AC
  Filled 2018-09-26: qty 5

## 2018-09-26 MED ORDER — FENTANYL CITRATE (PF) 100 MCG/2ML IJ SOLN
INTRAMUSCULAR | Status: AC
  Filled 2018-09-26: qty 2

## 2018-09-26 MED ORDER — MIDAZOLAM HCL 2 MG/2ML IJ SOLN
INTRAMUSCULAR | Status: DC | PRN
  Administered 2018-09-26: 2 mg via INTRAVENOUS

## 2018-09-26 MED ORDER — LACTATED RINGERS IV SOLN
INTRAVENOUS | Status: DC | PRN
  Administered 2018-09-26 (×2): via INTRAVENOUS

## 2018-09-26 MED ORDER — MIDAZOLAM HCL 2 MG/2ML IJ SOLN
INTRAMUSCULAR | Status: AC
  Filled 2018-09-26: qty 2

## 2018-09-26 SURGICAL SUPPLY — 25 items
BLADE SURG 15 STRL LF C SS BP (BLADE) IMPLANT
BLADE SURG 15 STRL SS (BLADE)
DRSG OPSITE POSTOP 3X4 (GAUZE/BANDAGES/DRESSINGS) ×3 IMPLANT
DURAPREP 26ML APPLICATOR (WOUND CARE) ×3 IMPLANT
GLOVE BIOGEL PI IND STRL 7.0 (GLOVE) ×4 IMPLANT
GLOVE BIOGEL PI INDICATOR 7.0 (GLOVE) ×8
GLOVE ECLIPSE 7.0 STRL STRAW (GLOVE) ×3 IMPLANT
GOWN STRL REUS W/TWL LRG LVL3 (GOWN DISPOSABLE) ×9 IMPLANT
HIBICLENS CHG 4% 4OZ BTL (MISCELLANEOUS) ×3 IMPLANT
PACK LAPAROSCOPY BASIN (CUSTOM PROCEDURE TRAY) ×3 IMPLANT
PACK TRENDGUARD 450 HYBRID PRO (MISCELLANEOUS) ×1 IMPLANT
POUCH SPECIMEN RETRIEVAL 10MM (ENDOMECHANICALS) ×3 IMPLANT
PROTECTOR NERVE ULNAR (MISCELLANEOUS) ×3 IMPLANT
SET IRRIG TUBING LAPAROSCOPIC (IRRIGATION / IRRIGATOR) ×3 IMPLANT
SHEARS HARMONIC ACE PLUS 36CM (ENDOMECHANICALS) ×3 IMPLANT
SLEEVE XCEL OPT CAN 5 100 (ENDOMECHANICALS) ×3 IMPLANT
SUT VIC AB 3-0 X1 27 (SUTURE) ×3 IMPLANT
SUT VICRYL 0 UR6 27IN ABS (SUTURE) ×6 IMPLANT
TOWEL OR 17X24 6PK STRL BLUE (TOWEL DISPOSABLE) ×6 IMPLANT
TRAY FOLEY W/BAG SLVR 14FR (SET/KITS/TRAYS/PACK) ×3 IMPLANT
TRENDGUARD 450 HYBRID PRO PACK (MISCELLANEOUS) ×3
TROCAR BALLN 12MMX100 BLUNT (TROCAR) ×3 IMPLANT
TROCAR XCEL NON-BLD 5MMX100MML (ENDOMECHANICALS) ×3 IMPLANT
TUBING INSUF HEATED (TUBING) ×3 IMPLANT
WARMER LAPAROSCOPE (MISCELLANEOUS) ×3 IMPLANT

## 2018-09-26 NOTE — MAU Note (Signed)
Dr Hart RochesterHollis (anesthesiologist) notified of pt's NPO status before surgery for ectopic. Pt had potato [facility] Hart RochesterHollis states pt does not need any pepcid or Citrate preop

## 2018-09-26 NOTE — H&P (Signed)
Cathy Webster is an 27 y.o. [id number] Unknown female.   Chief Complaint: abdominal pain HPI:  Patient with Cathy Webster IUD placed after birth of last baby with acute onset of abdominal pain today. Has had spotting with her IUD but not normal cycles. Began having bleeding 09/18/18. Pain was new and brought her in. Pain was left sided and noted rectal pain as well. She is found to have 4 cm ectopic on left with complex fluid in her abdomen c/w rupture.  Past Medical History:  Diagnosis Date  . Abnormal Pap smear 2008  . Cystitis 04/09/16  . Hives   . Hypertension   . Preeclampsia   . UTI (lower urinary tract infection)     Past Surgical History:  Procedure Laterality Date  . CESAREAN SECTION    . TONSILLECTOMY      Family History  Problem Relation Age of Onset  . Diabetes Mother   . Hypertension Mother   . Asthma Brother    Social History:  reports that she has never smoked. She has never used smokeless tobacco. She reports that she does not drink alcohol or use drugs.   No Known Allergies  Medications Prior to Admission  Medication Sig Dispense Refill  . butalbital-acetaminophen-caffeine (FIORICET, ESGIC) 50-325-40 MG tablet Take 1 tablet by mouth every 6 (six) hours as needed for headache. 12 tablet 0  . famotidine (PEPCID) 20 MG tablet Take 1 tablet (20 mg total) by mouth 2 (two) times daily. 60 tablet 1  . fluticasone (FLONASE) 50 MCG/ACT nasal spray Place 1 spray into both nostrils daily. 16 g 2  . loperamide (IMODIUM) 2 MG capsule Take 1 capsule (2 mg total) by mouth 4 (four) times daily as needed for diarrhea or loose stools. 12 capsule 0  . meloxicam (MOBIC) 7.5 MG tablet Take 2 tablets (15 mg total) by mouth daily as needed for pain. 30 tablet 2  . ondansetron (ZOFRAN) 4 MG tablet Take 1 tablet (4 mg total) by mouth daily as needed for nausea or vomiting. 10 tablet 0  . phenazopyridine (PYRIDIUM) 200 MG tablet Take 1 tablet (200 mg total) by mouth 3 (three) times daily as  needed for pain. 20 tablet 0  . sucralfate (CARAFATE) 1 g tablet Take 1 tablet (1 g total) by mouth 4 (four) times daily. 120 tablet 1     A comprehensive review of systems was negative.  Blood pressure 132/68, pulse 99, temperature 98.1 F (36.7 C), resp. rate 18, height 5\' 6"  (1.676 m), weight 123.8 kg, last menstrual period 09/18/2018. General appearance: alert, cooperative, appears stated age and mildly obese Head: Normocephalic, without obvious abnormality, atraumatic Neck: supple, symmetrical, trachea midline Lungs: normal effort Heart: regular rate and rhythm Abdomen: soft, tender to palpation, no guarding, no rebound Extremities: Homans sign is negative, no sign of DVT Skin: Skin color, texture, turgor normal. No rashes or lesions Neurologic: Grossly normal   Lab Results  Component Value Date   WBC 4.1 09/17/2018   HGB 12.2 09/17/2018   HCT 39.2 09/17/2018   MCV 88.9 09/17/2018   PLT 204 09/17/2018   Blood type O pos  Assessment/Plan Left tubal pregnancy without intrauterine pregnancy  Vaginal bleeding in pregnancy, first trimester - Plan: US OB LESS THAN 14 WEEKS WITH OB TRANSVAGINAL, US OB LESS THAN 14 WEEKS WITH OB TRANSVAGINAL  For laparoscopic removal of ectopic. Desires to keep IUD in place.Risks include but are not limited to bleeding, infection, injury to surrounding structures, including bowel, bladder and ureters,  blood clots, and death.  Likelihood of success is high.  Cathy Webster 09/26/2018, 10:23 PM

## 2018-09-26 NOTE — MAU Provider Note (Signed)
History     CSN: [id number]  Arrival date and time: 09/26/18 2030   First Provider Initiated Contact with Patient 09/26/18 2101      Chief Complaint  Patient presents with  . Abdominal Pain  . Vaginal Bleeding   [name] presents for vaginal bleeding, left sided abdominal pain and rectal pain She states that her pain started earlier today She has had a liletta IUD in place for 2 years She reports that she has continued to have periods with her IUD, but they are irregular Her last normal period was at the end of September She had some bleeding around December 12th that she also thought was a period because it lasted about 5 days Then she had bleeding again yesterday and today accompanied by the pain, which is why she was worried that something was wrong She went to the Urgent Care earlier today, where her IUD strings were not visualized and it was recommended that she follow up here with us    Past Medical History:  Diagnosis Date  . Abnormal Pap smear 2008  . Cystitis 04/09/16  . Hives   . Hypertension   . Preeclampsia   . UTI (lower urinary tract infection)     Past Surgical History:  Procedure Laterality Date  . CESAREAN SECTION    . TONSILLECTOMY      Family History  Problem Relation Age of Onset  . Diabetes Mother   . Hypertension Mother   . Asthma Brother     Social History   Tobacco Use  . Smoking status: Never Smoker  . Smokeless tobacco: Never Used  Substance Use Topics  . Alcohol use: No  . Drug use: No    Allergies: No Known Allergies  Medications Prior to Admission  Medication Sig Dispense Refill Last Dose  . butalbital-acetaminophen-caffeine (FIORICET, ESGIC) 50-325-40 MG tablet Take 1 tablet by mouth every 6 (six) hours as needed for headache. 12 tablet 0   . famotidine (PEPCID) 20 MG tablet Take 1 tablet (20 mg total) by mouth 2 (two) times daily. 60 tablet 1   . fluticasone (FLONASE) 50 MCG/ACT nasal spray Place 1 spray into both  nostrils daily. 16 g 2   . loperamide (IMODIUM) 2 MG capsule Take 1 capsule (2 mg total) by mouth 4 (four) times daily as needed for diarrhea or loose stools. 12 capsule 0   . meloxicam (MOBIC) 7.5 MG tablet Take 2 tablets (15 mg total) by mouth daily as needed for pain. 30 tablet 2   . ondansetron (ZOFRAN) 4 MG tablet Take 1 tablet (4 mg total) by mouth daily as needed for nausea or vomiting. 10 tablet 0   . phenazopyridine (PYRIDIUM) 200 MG tablet Take 1 tablet (200 mg total) by mouth 3 (three) times daily as needed for pain. 20 tablet 0   . sucralfate (CARAFATE) 1 g tablet Take 1 tablet (1 g total) by mouth 4 (four) times daily. 120 tablet 1     Review of Systems  All other systems reviewed and are negative.  Physical Exam   Blood pressure 132/68, pulse 99, temperature 98.1 F (36.7 C), resp. rate 18, height 5\' 6"  (1.676 m), weight 123.8 kg, last menstrual period 09/18/2018.  Physical Exam  Constitutional: She is oriented to person, place, and time. She appears well-developed and well-nourished. No distress.  HENT:  Head: Normocephalic and atraumatic.  Eyes: Pupils are equal, round, and reactive to light. Right eye exhibits no discharge. Left eye exhibits no discharge.  No scleral icterus.  Cardiovascular: Normal rate and regular rhythm.  Respiratory: Effort normal. No respiratory distress.  GI: Soft. She exhibits no distension.  Mildly tender in LLQ  Genitourinary:    Genitourinary Comments: Small amount of blood in vaginal vault. IUD strings visualized exiting the cervix.    Neurological: She is alert and oriented to person, place, and time. No cranial nerve deficit.  Skin: Skin is warm and dry. She is not diaphoretic.  Psychiatric: She has a normal mood and affect. Her behavior is normal. Judgment and thought content normal.    MAU Course  Procedures  MDM Patient presents with positive pregnancy test with IUD strings in place US obtained confirming ectopic pregnancy in L  adnexa Quantitative hcg, CBC and Abo/Rh ordered and pending Dr. Shawnie PonsPratt notified of results  Assessment and Plan  Left tubal pregnancy without intrauterine pregnancy - Discussed results of US with patient and handed off care to Dr. Shawnie PonsPratt, who is on call today  Vaginal bleeding in pregnancy, first trimester - Plan: US OB LESS THAN 14 WEEKS WITH OB TRANSVAGINAL, US OB LESS THAN 14 WEEKS WITH OB TRANSVAGINAL  [name] 09/26/2018, 9:46 PM

## 2018-09-26 NOTE — Anesthesia Preprocedure Evaluation (Signed)
Anesthesia Evaluation  Patient identified by MRN, date of birth, ID band Patient awake    Reviewed: Allergy & Precautions, NPO status , Patient's Chart, lab work & pertinent test results  Airway Mallampati: II  TM Distance: >3 FB Neck ROM: Full    Dental  (+) Teeth Intact, Dental Advisory Given   Pulmonary neg pulmonary ROS,    breath sounds clear to auscultation       Cardiovascular hypertension,  Rhythm:Regular Rate:Normal     Neuro/Psych negative neurological ROS  negative psych ROS   GI/Hepatic Neg liver ROS, GERD  Medicated and Controlled,  Endo/Other    Renal/GU negative Renal ROS     Musculoskeletal   Abdominal (+) + obese,   Peds  Hematology   Anesthesia Other Findings   Reproductive/Obstetrics                             Lab Results  Component Value Date   WBC 6.1 09/26/2018   HGB 11.5 (L) 09/26/2018   HCT 34.9 (L) 09/26/2018   MCV 87.3 09/26/2018   PLT 177 09/26/2018   Lab Results  Component Value Date   CREATININE 0.66 09/17/2018   BUN <5 (L) 09/17/2018   NA 138 09/17/2018   K 3.7 09/17/2018   CL 106 09/17/2018   CO2 23 09/17/2018   No results found for: INR, PROTIME   Anesthesia Physical Anesthesia Plan  ASA: III  Anesthesia Plan: General   Post-op Pain Management:    Induction: Intravenous, Rapid sequence and Cricoid pressure planned  PONV Risk Score and Plan: 4 or greater and Ondansetron, Dexamethasone and Midazolam  Airway Management Planned: Oral ETT  Additional Equipment: None  Intra-op Plan:   Post-operative Plan: Extubation in OR  Informed Consent: I have reviewed the patients History and Physical, chart, labs and discussed the procedure including the risks, benefits and alternatives for the proposed anesthesia with the patient or authorized representative who has indicated his/her understanding and acceptance.   Dental advisory given  Plan  Discussed with: CRNA  Anesthesia Plan Comments:         Anesthesia Quick Evaluation

## 2018-09-26 NOTE — Discharge Instructions (Signed)
[facility] tonight.

## 2018-09-26 NOTE — ED Triage Notes (Signed)
Pt present pelvic and rectal pain that started over a week ago.

## 2018-09-26 NOTE — MAU Note (Signed)
Having some abdominal pain and rectal pain since 1730. Also vaginal bleeding. Has felt like gas pains this past wk but worse tonight. Has had spoitting for a wk and then slowed up and thought was period. Tonight bleeding was alittle more.

## 2018-09-26 NOTE — ED Provider Notes (Signed)
[facility]    CSN: [id number] Arrival date & time: 09/26/18  1922     History   Chief Complaint Chief Complaint  Patient presents with  . Pelvic Pain  . Rectal Pain    HPI Cathy Webster is a 27 y.o. female.   Who presents with LLQ pain and rectal pain x 1 week and today got worse. She was ending her period this week and today saw a lot more blood after having a BM, but could not see the size of the stool due to there being a  lot of blood in the toilet bowl. Her LLQ pain has been constant, she cant sit comfortably due to this. She has an IUD in, but had the provider cut the string short, so she does not check if the IUD is still in place. She does not know where the blood is coming from.      Past Medical History:  Diagnosis Date  . Abnormal Pap smear 2008  . Cystitis 04/09/16  . Hives   . Hypertension   . Preeclampsia   . UTI (lower urinary tract infection)     Patient Active Problem List   Diagnosis Date Noted  . Contraceptive management 04/09/2016  . Preeclampsia in postpartum period 03/01/2016  . Abnormal Pap smear of cervix 12/22/2013    Past Surgical History:  Procedure Laterality Date  . CESAREAN SECTION    . TONSILLECTOMY      OB History    Gravida  4   Para  4   Term  4   Preterm      AB      Living  4     SAB      TAB      Ectopic      Multiple  0   Live Births  4            Home Medications    Prior to Admission medications   Medication Sig Start Date End Date Taking? Authorizing Provider  butalbital-acetaminophen-caffeine (FIORICET, ESGIC) 50-325-40 MG tablet Take 1 tablet by mouth every 6 (six) hours as needed for headache. 12/17/17 12/17/18  Triplett, Rulon Eisenmengerari B, FNP  famotidine (PEPCID) 20 MG tablet Take 1 tablet (20 mg total) by mouth 2 (two) times daily. 06/26/18   Emily FilbertWilliams, Jonathan E, MD  fluticasone (FLONASE) 50 MCG/ACT nasal spray Place 1 spray into both nostrils daily. 07/29/17 07/29/18  Orvil FeilWoods, Jaclyn  M, PA-C  loperamide (IMODIUM) 2 MG capsule Take 1 capsule (2 mg total) by mouth 4 (four) times daily as needed for diarrhea or loose stools. 09/17/18   Hedges, Tinnie GensJeffrey, PA-C  meloxicam (MOBIC) 7.5 MG tablet Take 2 tablets (15 mg total) by mouth daily as needed for pain. 05/02/18   Tarry KosXu, Naiping M, MD  ondansetron (ZOFRAN) 4 MG tablet Take 1 tablet (4 mg total) by mouth daily as needed for nausea or vomiting. 02/03/17   Jeanmarie PlantMcShane, James A, MD  phenazopyridine (PYRIDIUM) 200 MG tablet Take 1 tablet (200 mg total) by mouth 3 (three) times daily as needed for pain. 06/26/18 06/26/19  Emily FilbertWilliams, Jonathan E, MD  sucralfate (CARAFATE) 1 g tablet Take 1 tablet (1 g total) by mouth 4 (four) times daily. 06/26/18 06/26/19  Emily FilbertWilliams, Jonathan E, MD    Family History Family History  Problem Relation Age of Onset  . Diabetes Mother   . Hypertension Mother   . Asthma Brother     Social History Social History   Tobacco Use  .  Smoking status: Never Smoker  . Smokeless tobacco: Never Used  Substance Use Topics  . Alcohol use: No  . Drug use: No     Allergies   Patient has no known allergies.   Review of Systems Review of Systems  Constitutional: Negative for chills, diaphoresis and fever.  Gastrointestinal: Positive for abdominal pain, constipation and nausea.       Unknown if has rectal bleeding or not.   Genitourinary: Positive for menstrual problem, pelvic pain and vaginal bleeding. Negative for decreased urine volume, dysuria, flank pain, frequency, genital sores, urgency and vaginal discharge.  Skin: Negative for rash and wound.  Hematological: Negative for adenopathy.   Physical Exam Triage Vital Signs ED Triage Vitals [09/26/18 1943]  Enc Vitals Group     BP (!) 146/99     Pulse Rate 98     Resp 18     Temp 98.6 F (37 C)     Temp Source Oral     SpO2 99 %     Weight      Height      Head Circumference      Peak Flow      Pain Score      Pain Loc      Pain Edu?      Excl. in GC?     No data found.  Updated Vital Signs BP (!) 146/99 (BP Location: Right Arm)   Pulse 98   Temp 98.6 F (37 C) (Oral)   Resp 18   SpO2 99%   Visual Acuity Right Eye Distance:   Left Eye Distance:   Bilateral Distance:    Right Eye Near:   Left Eye Near:    Bilateral Near:     Physical Exam Vitals signs and nursing note reviewed.  Constitutional:      General: She is in acute distress.     Comments: Is in a lot of pain  HENT:     Head: Normocephalic.     Nose: Nose normal.  Eyes:     General: No scleral icterus.    Conjunctiva/sclera: Conjunctivae normal.  Neck:     Musculoskeletal: Neck supple.  Pulmonary:     Effort: Pulmonary effort is normal.  Abdominal:     Palpations: Abdomen is soft.     Tenderness: There is abdominal tenderness. There is guarding.     Comments: LLQ, suprapubic and not as bad on RLL.   Genitourinary:    General: Normal vulva.     Vagina: No vaginal discharge.     Rectum: Normal.     Comments: Intravaginal area has moderate blood and even after using our largest speculum I could not reach to see her cervix.  Rectal exam shows no masses, and there is not blood from rectum noted.  Musculoskeletal: Normal range of motion.  Neurological:     Mental Status: She is alert and oriented to person, place, and time.  Psychiatric:        Mood and Affect: Mood normal.        Behavior: Behavior normal.        Thought Content: Thought content normal.        Judgment: Judgment normal.      UC Treatments / Results  Labs (all labs ordered are listed, but only abnormal results are displayed) Labs Reviewed  POCT URINALYSIS DIP (DEVICE) - Abnormal; Notable for the following components:      Result Value   Hgb urine dipstick LARGE (*)  Protein, ur 100 (*)    All other components within normal limits  POCT PREGNANCY, URINE - Abnormal; Notable for the following components:   Preg Test, Ur POSITIVE (*)    All other components within normal limits     EKG None  Radiology No results found.  Procedures Procedures  Medications Ordered in UC Medications - No data to display  Initial Impression / Assessment and Plan / UC Course  I have reviewed the triage vital signs and the nursing notes. I am concered of L tubal pregnancy. [facility].  Pertinent labs results that were available during my care of the patient were reviewed by me and considered in my medical decision making (see chart for details).  Final Clinical Impressions(s) / UC Diagnoses   Final diagnoses:  Abdominal pain, LLQ  Left tubal pregnancy, unspecified whether intrauterine pregnancy present     Discharge Instructions     [facility] get more test done tonight.     ED Prescriptions    None     Controlled Substance Prescriptions [place] Controlled Substance Registry consulted?    Garey HamRodriguez-Southworth, Rickell Wiehe, [place] 09/26/18 2021

## 2018-09-26 NOTE — Anesthesia Procedure Notes (Signed)
Procedure Name: Intubation Date/Time: 09/26/2018 11:17 PM Performed by: Genevie Ann, CRNA Pre-anesthesia Checklist: Patient identified, Emergency Drugs available, Suction available, Patient being monitored and Timeout performed Patient Re-evaluated:Patient Re-evaluated prior to induction Oxygen Delivery Method: Circle system utilized Preoxygenation: Pre-oxygenation with 100% oxygen Induction Type: IV induction, Rapid sequence and Cricoid Pressure applied Laryngoscope Size: Mac and 3 Grade View: Grade I Tube type: Oral Tube size: 7.0 mm Number of attempts: 1 Placement Confirmation: ETT inserted through vocal cords under direct vision,  positive ETCO2 and breath sounds checked- equal and bilateral Secured at: 22 cm Dental Injury: Teeth and Oropharynx as per pre-operative assessment

## 2018-09-27 ENCOUNTER — Encounter (HOSPITAL_COMMUNITY)
Admission: AD | Disposition: A | Discharge status: Home / Self Care | Payer: Self-pay | Source: Ambulatory Visit | Attending: Family Medicine

## 2018-09-27 DIAGNOSIS — O00102 Left tubal pregnancy without intrauterine pregnancy: Secondary | ICD-10-CM | POA: Diagnosis not present

## 2018-09-27 SURGERY — LAPAROSCOPY OPERATIVE
Anesthesia: Choice

## 2018-09-27 MED ORDER — HYDROMORPHONE HCL 1 MG/ML IJ SOLN: 0.2500 mg | INTRAMUSCULAR | Status: DC | PRN

## 2018-09-27 MED ORDER — ROCURONIUM BROMIDE 100 MG/10ML IV SOLN
INTRAVENOUS | Status: AC
  Filled 2018-09-27: qty 1

## 2018-09-27 MED ORDER — OXYCODONE HCL 5 MG PO TABS: 5.0000 mg | ORAL_TABLET | Freq: Four times a day (QID) | ORAL | 0 refills | Status: DC | PRN

## 2018-09-27 MED ORDER — OXYCODONE HCL 5 MG PO TABS
5.0000 mg | ORAL_TABLET | Freq: Once | ORAL | Status: AC | PRN
  Administered 2018-09-27: 5 mg via ORAL

## 2018-09-27 MED ORDER — OXYCODONE HCL 5 MG PO TABS
ORAL_TABLET | ORAL | Status: AC
  Filled 2018-09-27: qty 1

## 2018-09-27 MED ORDER — BUPIVACAINE HCL (PF) 0.25 % IJ SOLN
INTRAMUSCULAR | Status: DC | PRN
  Administered 2018-09-27: 12 mL

## 2018-09-27 MED ORDER — LACTATED RINGERS IV SOLN: INTRAVENOUS | Status: DC

## 2018-09-27 MED ORDER — MEPERIDINE HCL 25 MG/ML IJ SOLN
INTRAMUSCULAR | Status: AC
  Filled 2018-09-27: qty 1

## 2018-09-27 MED ORDER — SUGAMMADEX SODIUM 200 MG/2ML IV SOLN
INTRAVENOUS | Status: DC | PRN
  Administered 2018-09-27: 300 mg via INTRAVENOUS

## 2018-09-27 MED ORDER — PROPOFOL 10 MG/ML IV BOLUS
INTRAVENOUS | Status: AC
  Filled 2018-09-27: qty 20

## 2018-09-27 MED ORDER — LABETALOL HCL 5 MG/ML IV SOLN
INTRAVENOUS | Status: DC | PRN
  Administered 2018-09-27: 5 mg via INTRAVENOUS

## 2018-09-27 MED ORDER — DEXAMETHASONE SODIUM PHOSPHATE 4 MG/ML IJ SOLN
INTRAMUSCULAR | Status: AC
  Filled 2018-09-27: qty 1

## 2018-09-27 MED ORDER — SUGAMMADEX SODIUM 500 MG/5ML IV SOLN
INTRAVENOUS | Status: AC
  Filled 2018-09-27: qty 5

## 2018-09-27 MED ORDER — OXYCODONE HCL 5 MG/5ML PO SOLN: 5.0000 mg | Freq: Once | ORAL | Status: AC | PRN

## 2018-09-27 MED ORDER — DEXAMETHASONE SODIUM PHOSPHATE 10 MG/ML IJ SOLN
INTRAMUSCULAR | Status: DC | PRN
  Administered 2018-09-27: 4 mg via INTRAVENOUS

## 2018-09-27 MED ORDER — GLYCOPYRROLATE 0.2 MG/ML IJ SOLN
INTRAMUSCULAR | Status: AC
  Filled 2018-09-27: qty 1

## 2018-09-27 MED ORDER — ONDANSETRON HCL 4 MG/2ML IJ SOLN
INTRAMUSCULAR | Status: AC
  Filled 2018-09-27: qty 2

## 2018-09-27 MED ORDER — ONDANSETRON HCL 4 MG/2ML IJ SOLN
INTRAMUSCULAR | Status: DC | PRN
  Administered 2018-09-27: 4 mg via INTRAVENOUS

## 2018-09-27 MED ORDER — LABETALOL HCL 5 MG/ML IV SOLN
INTRAVENOUS | Status: AC
  Filled 2018-09-27: qty 4

## 2018-09-27 MED ORDER — MEPERIDINE HCL 25 MG/ML IJ SOLN
6.2500 mg | INTRAMUSCULAR | Status: DC | PRN
  Administered 2018-09-27 (×2): 12.5 mg via INTRAVENOUS

## 2018-09-27 MED ORDER — PROMETHAZINE HCL 25 MG/ML IJ SOLN: 6.2500 mg | INTRAMUSCULAR | Status: DC | PRN

## 2018-09-27 MED ORDER — LIDOCAINE HCL (CARDIAC) PF 100 MG/5ML IV SOSY
PREFILLED_SYRINGE | INTRAVENOUS | Status: AC
  Filled 2018-09-27: qty 5

## 2018-09-27 MED ORDER — KETOROLAC TROMETHAMINE 30 MG/ML IJ SOLN
INTRAMUSCULAR | Status: DC | PRN
  Administered 2018-09-27: 30 mg via INTRAVENOUS

## 2018-09-27 NOTE — Op Note (Addendum)
PREOPERATIVE DIAGNOSIS: Probable ruptured ectopic pregnancy  POSTOPERATIVE DIAGNOSIS: Same  PROCEDURE: Laparoscopic left salpingectomy, adhesiolysis   INDICATIONS: 27 y.o. [id number] at Unknown here for with ruptured ectopic pregnancy with blood type O pos. Patient was counseled regarding need for laparoscopic salpingectomy. Risks of surgery including bleeding which may require transfusion or reoperation, infection, injury to bowel or other surrounding organs, need for additional procedures including laparotomy and other postoperative/anesthesia complications were explained to patient.  Written informed consent was obtained  FINDINGS: Adhesions of omentum to anterior abdominal wall. moderate amount of hemoperitoneum estimated to be about 100cc of blood and clots.  Dilated left fallopian tube containing ectopic gestation. Small normal appearing uterus, normal right fallopian tube, left ovary and right ovary.  ANESTHESIA: General  SPECIMENS: left fallopian tube to pathology  COMPLICATIONS: None immediate  PROCEDURE IN DETAIL:  The patient was taken to the operating room where general anesthesia was administered and was found to be adequate.  She was placed in the dorsal lithotomy position, and was prepped and draped in a sterile manner.  A Foley catheter was inserted into her bladder and attached to constant drainage and a uterine manipulator was then advanced into the uterus .  After an adequate timeout was performed, attention was then turned to the patient's abdomen where a 10-mm skin incision was made in the umbilical fold. Fascia and peritoneum were entered sharply.  A 0 Vicryl suture was used to tag the fascia circumferentially.  A Hassan trocar was placed. The laparoscope was introduced.  A survey of the patient's pelvis and abdomen revealed the findings as above.  Two left lower quadrant ports were placed under direct visualization; 5-mm x 2.  The Harmonic scalpel used to take down adhesions of  omentum to anterior abdominal wall. No bowel was in these and care taken to stay against the anterior abdominal wall. Two separate areas were taken down the second of which was also attached to the uterus. Excellent hemostasis noted. Attention was then turned to the left fallopian tube which was grasped and ligated from the underlying mesosalpinx and uterine attachment using the Harmonic instrument. Good hemostasis was noted. The specimen was placed in an EndoCatch bag and removed from the abdomen intact. Clot and blood removed with the Nezjhat. The abdomen was desufflated, and all instruments were removed.  The umbilicus incision was closed with the afore mentioned Vicryl suture; and all skin incisions were closed with a 3-0 Vicryl subcuticular stitch followed by DermaBond. The patient tolerated the procedure well. Hulka tenaculum removed.  All instruments, needles, and sponge counts were correct x 2. The patient was taken to the recovery room in stable condition.   Reva Boresanya S Keller Mikels MD 09/27/2018 12:30 AM

## 2018-09-27 NOTE — Discharge Instructions (Signed)
Ruptured Ectopic Pregnancy ° °An ectopic pregnancy is when a fertilized egg attaches (implants) outside of the uterus, usually in a fallopian tube. A ruptured ectopic pregnancy is when the fallopian tube tears or bursts. This results in internal bleeding, intense abdominal pain, and sometimes, vaginal bleeding. °Most ectopic pregnancies occur in the fallopian tube. In rare cases, it may occur on the ovary, intestine, pelvis, or cervix. An ectopic pregnancy does not have the ability to develop into a normal, healthy baby. A ruptured ectopic pregnancy can affect your ability to have children (fertility), depending on damage it causes to your reproductive organs. °Ruptured ectopic pregnancy is a medical emergency. If not treated immediately, it can lead to blood loss, shock, or even death. °What are the causes? °Most ectopic pregnancies are caused by damage to the fallopian tubes. The damage prevents the fertilized egg from implanting in the uterus. In some cases, the cause may not be known. °What increases the risk? °You are at increased risk for an ectopic pregnancy if: °· You have had a previous ectopic pregnancy. °· You have had previous fallopian tube surgery. °· You have had previous surgery to have the fallopian tubes tied (tubal ligation). °· You have had infertility treatments or have a history of infertility. °· You have been exposed to DES. DES is a medicine that was used until 1971 and had effects on babies whose mothers took the medicine. °· You use an IUD (intrauterine device) for birth control. °· You use progestin-only oral contraception for birth control. °· You have a history of pelvic inflammatory disease (PID). °· You have a history of endometriosis. °· You smoke. °· You became sexually active before 28 years of age. °· You have multiple sexual partners. °What are the signs or symptoms? °Symptoms of a ruptured ectopic pregnancy and internal bleeding may include: °· Sudden, severe pain in the abdomen  and pelvis. °· Dizziness or fainting. °· Pain in the shoulder area. °· Vaginal bleeding. °How is this diagnosed? °This condition is diagnosed based on your medical history, symptoms, a physical exam, and tests, which may include: °· A pregnancy test. °· An ultrasound. °· Measuring the levels of the pregnancy hormone in the bloodstream. °· Taking a sample of tissue from the uterus (dilation and curettage, D&C). °· Surgery to visually examine the inside of the abdomen using a lighted tube (laparoscopy). °How is this treated? °This condition is treated with IV fluids and emergency surgery to remove the ectopic pregnancy and repair the area where the rupture occured. °If you have lost a lot of blood, you may need a blood transfusion. °If you are Rh negative and your baby's father is Rh positive, or the Rh type of the father is unknown, you may receive a Rho (D) immune globulin shot. This is to prevent Rh problems in future pregnancies. °Additional medicines may be given. °Get help right away if: °· You are taking medicines to treat an ectopic pregnancy and you develop symptoms of a rupture. These include: °? Fever or chills. °? Shoulder pain. °? Vaginal bleeding. °? Nausea and vomiting. °? Severe abdominal pain or cramping. °? Feeling light-headed or fainting. °Summary °· An ectopic pregnancy is when a fertilized egg attaches (implants) outside of the uterus, usually in a fallopian tube. A ruptured ectopic pregnancy is when the fallopian tube tears or bursts. °· Ruptured ectopic pregnancy is a medical emergency. If not treated immediately, it can lead to blood loss, shock, or even death. °· This condition is treated with   IV fluids and emergency surgery to remove the ectopic pregnancy and repair the area where the rupture occured. If you have lost a lot of blood, you may need a blood transfusion. This information is not intended to replace advice given to you by your health care provider. Make sure you discuss any  questions you have with your health care provider. Document Released: 09/21/2000 Document Revised: 12/12/2016 Document Reviewed: 12/12/2016 Elsevier Interactive Patient Education  2019 Elsevier Inc.  Laparoscopic Surgery  Laparoscopic surgery is a procedure to remove an ectopic pregnancy. This procedure is done by inserting small hollow tubes in your abdomen using a thin, lit camera (laparoscope). This camera lets your health care provider look at your ovary. Sometimes, this procedure cannot be done laparoscopically and needs to be changed to an open procedure, which involves a larger surgical cut (incision). Tell a health care provider about:  Any allergies you have.  All medicines you are taking, including vitamins, herbs, eye drops, creams, and over-the-counter medicines.  Any problems you or family members have had with anesthetic medicines.  Any blood disorders you have.  Any surgeries you have had.  Any medical conditions you have.  Whether you are pregnant or may be pregnant. What are the risks? Generally, this is a safe procedure. However, problems may occur, including:  Infection.  Bleeding.  Allergic reactions to medicines.  Damage to other structures or organs.  Additional surgery if the procedure is not successful.  The need to change to an open procedure.  Loss of the affected ovary.  The inability to get pregnant or have children in the future. What happens before the procedure?  Ask your health care provider about: ? Changing or stopping your regular medicines. This is especially important if you are taking diabetes medicines or blood thinners. ? Taking medicines such as aspirin and ibuprofen. These medicines can thin your blood. Do not take these medicines before your procedure if your health care provider instructs you not to.  Follow instructions from your health care provider about eating or drinking restrictions.  Plan to have someone take you  home after the procedure.  If you go home right after the procedure, plan to have someone with you for 24 hours. What happens during the procedure?  An IV tube will be inserted into one of your veins.  You will be given a medicine to make you fall asleep (general anesthetic). You may also be given a medicine to help you relax (sedative).  A flexible tube (urinarycatheter) may be placed in your bladder to drain urine.  Several small incisions will be made in your abdomen.  A laparoscope will be passed through a tube in one of the incisions.  Other surgical instruments will be passed through tubes in the other incisions.  Your ovary will be untwisted.  Your ovary will be observed to see if blood flow returns. If blood flow does not return, the ovary may have to be removed.  The incisions may be closed with stitches (sutures), skin glue, or adhesive tape.  The incisions will be covered with bandages (dressings). The procedure may vary among health care providers and hospitals. What happens after the procedure?  Your blood pressure, heart rate, breathing rate, and blood oxygen level will be monitored often until the medicines you were given have worn off.  You may be given medicine for pain or nausea and vomiting.  If you had a urinary catheter, it may be removed.  You may eat small, light  meals after your procedure as long as you do not have nausea or vomiting.  You will be taught breathing exercises to help prevent lung infection.  You will be encouraged to walk as soon as possible.  Do not drive for 24 hours if you were given a sedative. This information is not intended to replace advice given to you by your health care provider. Make sure you discuss any questions you have with your health care provider. Document Released: 12/17/2011 Document Revised: 10/13/2015 Document Reviewed: 09/05/2015 Elsevier Interactive Patient Education  2019 [employer]

## 2018-09-27 NOTE — Transfer of Care (Signed)
Immediate Anesthesia Transfer of Care Note  Patient: Cathy Webster  Procedure(s) Performed: LAPAROSCOPY OPERATIVE WITH LEFT SALPINGECTOMY (N/A Abdomen)  Patient Location: PACU  Anesthesia Type:General  Level of Consciousness: awake, alert  and oriented  Airway & Oxygen Therapy: Patient Spontanous Breathing and Patient connected to nasal cannula oxygen  Post-op Assessment: Report given to RN and Post -op Vital signs reviewed and stable  Post vital signs: Reviewed and stable  Last Vitals:  Vitals Value Taken Time  BP 157/76 09/27/2018 12:41 AM  Temp    Pulse 88 09/27/2018 12:43 AM  Resp 16 09/27/2018 12:43 AM  SpO2 100 % 09/27/2018 12:43 AM  Vitals shown include unvalidated device data.  Last Pain:  Vitals:   09/26/18 2048  PainSc: 5       Patients Stated Pain Goal: 0 (09/26/18 2048)  Complications: No apparent anesthesia complications

## 2018-09-28 NOTE — Anesthesia Postprocedure Evaluation (Signed)
Anesthesia Post Note  Patient: Cathy Webster  Procedure(s) Performed: LAPAROSCOPY OPERATIVE WITH LEFT SALPINGECTOMY (N/A Abdomen)     Patient location during evaluation: PACU Anesthesia Type: General Level of consciousness: awake and alert Pain management: pain level controlled Vital Signs Assessment: post-procedure vital signs reviewed and stable Respiratory status: spontaneous breathing, nonlabored ventilation, respiratory function stable and patient connected to nasal cannula oxygen Cardiovascular status: blood pressure returned to baseline and stable Postop Assessment: no apparent nausea or vomiting Anesthetic complications: no    Last Vitals:  Vitals:   09/27/18 0115 09/27/18 0130  BP: 136/80 129/79  Pulse: 81 80  Resp: 16 18  Temp:  36.6 C  SpO2: 100% 99%    Last Pain:  Vitals:   09/27/18 0130  PainSc: 2    Pain Goal: Patients Stated Pain Goal: 0 (09/26/18 2048)               Shelton SilvasKevin D Chloie Loney

## 2018-09-29 ENCOUNTER — Encounter (HOSPITAL_COMMUNITY): Payer: Self-pay | Admitting: Family Medicine

## 2018-10-08 DIAGNOSIS — O009 Unspecified ectopic pregnancy without intrauterine pregnancy: Secondary | ICD-10-CM

## 2018-10-08 DIAGNOSIS — Z9079 Acquired absence of other genital organ(s): Secondary | ICD-10-CM | POA: Insufficient documentation

## 2018-10-08 HISTORY — DX: Unspecified ectopic pregnancy without intrauterine pregnancy: O00.90

## 2018-10-08 HISTORY — PX: OTHER SURGICAL HISTORY: SHX169

## 2018-10-09 ENCOUNTER — Encounter: Payer: Self-pay | Admitting: Family Medicine

## 2018-10-09 ENCOUNTER — Ambulatory Visit (INDEPENDENT_AMBULATORY_CARE_PROVIDER_SITE_OTHER): Payer: BC Managed Care – PPO | Admitting: Family Medicine

## 2018-10-09 ENCOUNTER — Ambulatory Visit (INDEPENDENT_AMBULATORY_CARE_PROVIDER_SITE_OTHER): Payer: Self-pay | Admitting: Clinical

## 2018-10-09 VITALS — BP 130/85 | HR 81 | Ht 66.5 in | Wt 271.6 lb

## 2018-10-09 DIAGNOSIS — F4322 Adjustment disorder with anxiety: Secondary | ICD-10-CM

## 2018-10-09 DIAGNOSIS — F410 Panic disorder [episodic paroxysmal anxiety] without agoraphobia: Secondary | ICD-10-CM | POA: Insufficient documentation

## 2018-10-09 DIAGNOSIS — Z09 Encounter for follow-up examination after completed treatment for conditions other than malignant neoplasm: Secondary | ICD-10-CM

## 2018-10-09 NOTE — BH Specialist Note (Signed)
Integrated Behavioral Health Initial Visit  MRN: [id number] Name: Cathy Webster  Number of Integrated Behavioral Health Clinician visits:: 1/6 Session Start time: 10:28  Session End time: 10:53 Total time: 20 minutes  Type of Service: Integrated Behavioral Health- Individual/Family Interpretor:No. Interpretor Name and Language: n/a   Warm Hand Off Completed.       SUBJECTIVE: Cathy Webster is a 28 y.o. female accompanied by n/a Patient was referred by Tinnie Gens, MD for panic attacks Patient reports the following symptoms/concerns: Pt states her primary concern today is having recent panic attacks, and lack of quality sleep, with sweating, shaking, heart palpitations, along with feeling more stress and headaches, that began after surgery for ectopic pregnancy. Pt has not experienced in the past, and would like to learn self-coping strategy today.  Duration of problem: Less than 2 weeks; Severity of problem: mild  OBJECTIVE: Mood: Anxious and Affect: Appropriate Risk of harm to self or others: No plan to harm self or others  LIFE CONTEXT: Family and Social: Pt lives with her four children; mother is supportive School/Work: Engineer, materials Self-Care: Recognizing a greater need for self-care Life Changes: Recent ectopic pregnancy with surgery; all 4 children home on school break post-surgery  GOALS ADDRESSED: Patient will: 1. Reduce symptoms of: anxiety and stress 2. Increase knowledge and/or ability of: self-management skills  3. Demonstrate ability to: Increase healthy adjustment to current life circumstances  INTERVENTIONS: Interventions utilized: Mindfulness or Management consultant and Psychoeducation and/or Health Education  Standardized Assessments completed: GAD-7 and PHQ 9  ASSESSMENT: Patient currently experiencing Adjustment disorder with anxious mood.   Patient may benefit from psychoeducation and brief therapeutic interventions regarding  coping with symptoms of anxiety with panic attacks.   PLAN: 1. Follow up with behavioral health clinician on : As needed 2. Behavioral recommendations:  -Prioritize healthy sleep tonight -CALM relaxation breathing exercise every morning; at bedtime. Continue for as long as remains helpful -Read educational material regarding coping with anxiety and panic attacks -Use apps for additional self-coping; consider using Stop, Breathe, and Think kids app in the classroom with students 3. Referral(s): Integrated Behavioral Health Services (In Clinic) 4. "From scale of 1-10, how likely are you to follow plan?": 10  Rae Lips, LCSW  Depression screen [facility] 2/9 10/09/2018 04/09/2016 12/07/2015 08/11/2015  Decreased Interest 0 0 0 0  Down, Depressed, Hopeless 0 0 0 0  PHQ - 2 Score 0 0 0 0  Altered sleeping 1 0 - -  Tired, decreased energy 1 0 - -  Change in appetite 1 0 - -  Feeling bad or failure about yourself  0 0 - -  Trouble concentrating 0 0 - -  Moving slowly or fidgety/restless 0 0 - -  Suicidal thoughts 0 0 - -  PHQ-9 Score 3 0 - -   GAD 7 : Generalized Anxiety Score 10/09/2018  Nervous, Anxious, on Edge 0  Control/stop worrying 0  Worry too much - different things 0  Trouble relaxing 0  Restless 0  Easily annoyed or irritable 1  Afraid - awful might happen 0  Total GAD 7 Score 1

## 2018-10-09 NOTE — Progress Notes (Signed)
   Subjective:    Patient ID: Cathy Webster is a 28 y.o. female presenting with Follow-up  on 10/09/2018  HPI: Here today for f/u following laparoscopic salpingectomy for ectopic. She is healing well. Some pain and swelling at umbilicus. Has her IUD in place. Has h/o pp depression. Having some shaking and increasing anxiety and dreaming about surgery. She is feeling nervous  Review of Systems  Constitutional: Negative for chills and fever.  Respiratory: Negative for shortness of breath.   Cardiovascular: Negative for chest pain.  Gastrointestinal: Negative for abdominal pain, nausea and vomiting.  Genitourinary: Negative for dysuria.  Skin: Negative for rash.      Objective:    BP 130/85   Pulse 81   Ht 5' 6.5" (1.689 m)   Wt 271 lb 9.6 oz (123.2 kg)   LMP 09/18/2018 (LMP Unknown)   Breastfeeding Unknown   BMI 43.18 kg/m  Physical Exam Constitutional:      General: She is not in acute distress.    Appearance: She is well-developed.  HENT:     Head: Normocephalic and atraumatic.  Eyes:     General: No scleral icterus. Neck:     Musculoskeletal: Neck supple.  Cardiovascular:     Rate and Rhythm: Normal rate.  Pulmonary:     Effort: Pulmonary effort is normal.  Abdominal:     Palpations: Abdomen is soft.  Skin:    General: Skin is warm and dry.     Comments: Incisions are well healed.  Neurological:     Mental Status: She is alert and oriented to person, place, and time.         Assessment & Plan:  Panic attack - Referral to [facility] - Plan: Ambulatory referral to [facility]  Postop check - Doing well. Return prn  Return if symptoms worsen or fail to improve.  Reva Bores 10/09/2018 3:21 PM

## 2018-10-09 NOTE — Patient Instructions (Signed)
Panic Attack  A panic attack is when you suddenly feel very afraid, uncomfortable, or nervous (anxious). A panic attack can happen when you are scared or for no reason. A panic attack can feel like a serious problem. It can even feel like a heart attack or stroke. See your doctor when you have a panic attack to make sure you do not have a serious problem. Follow these instructions at home:  Take medicines only as told by your doctor.  If you feel worried or nervous, try not to have caffeine.  Take good care of your health. To do this: ? Eat healthy. Make sure to eat fresh fruits and vegetables, whole grains, lean meats, and low-fat dairy. ? Get enough sleep. Try to sleep for 7-8 hours each night. ? Exercise. Try to be active for 30 minutes 5 or more days a week. ? Do not smoke. Talk to your doctor if you need help quitting. ? Limit how much alcohol you drink:  If you are a woman who is not pregnant: try not to have more than 1 drink a day.  If you are a man: try not to have more than 2 drinks a day.  One drink equals 12 oz of beer, 5 oz of wine, or 1 oz of hard liquor.  Keep all follow-up visits as told by your doctor. This is important. Contact a doctor if:  Your symptoms do not get better.  Your symptoms get worse.  You are not able to take your medicines as told. Get help right away if:  You have thoughts of hurting yourself or others.  You have symptoms of a panic attack. Do not drive yourself to the hospital. Have someone else drive you or call an ambulance. If you feel like you may hurt yourself or others, or have thoughts about taking your own life, get help right away. You can go to your nearest emergency department or call:  Your local emergency services (911 in the [place]  A suicide crisis helpline, such as the National Suicide Prevention Lifeline at 1-800-273-8255. This is open 24 hours a day. Summary  A panic attack is when you suddenly feel very afraid,  uncomfortable, or nervous (anxious).  See your doctor when you have a panic attack to make sure that you do not have another serious problem.  If you feel like you may hurt yourself or others, get help right away by calling 911. This information is not intended to replace advice given to you by your health care provider. Make sure you discuss any questions you have with your health care provider. Document Released: 10/27/2010 Document Revised: 11/07/2016 Document Reviewed: 11/07/2016 Elsevier Interactive Patient Education  2019 Elsevier Inc.  

## 2018-12-06 ENCOUNTER — Other Ambulatory Visit: Payer: Self-pay

## 2018-12-06 ENCOUNTER — Emergency Department (HOSPITAL_COMMUNITY): Payer: BC Managed Care – PPO

## 2018-12-06 ENCOUNTER — Encounter (HOSPITAL_COMMUNITY): Payer: Self-pay | Admitting: Emergency Medicine

## 2018-12-06 ENCOUNTER — Emergency Department (HOSPITAL_COMMUNITY)
Admission: EM | Admit: 2018-12-06 | Discharge: 2018-12-06 | Disposition: A | Discharge status: Home / Self Care | Payer: BC Managed Care – PPO | Attending: Emergency Medicine | Admitting: Emergency Medicine

## 2018-12-06 DIAGNOSIS — W182XXA Fall in (into) shower or empty bathtub, initial encounter: Secondary | ICD-10-CM | POA: Diagnosis not present

## 2018-12-06 DIAGNOSIS — Y92002 Bathroom of unspecified non-institutional (private) residence single-family (private) house as the place of occurrence of the external cause: Secondary | ICD-10-CM | POA: Insufficient documentation

## 2018-12-06 DIAGNOSIS — Y998 Other external cause status: Secondary | ICD-10-CM | POA: Insufficient documentation

## 2018-12-06 DIAGNOSIS — S8992XA Unspecified injury of left lower leg, initial encounter: Secondary | ICD-10-CM | POA: Diagnosis not present

## 2018-12-06 DIAGNOSIS — Y93E1 Activity, personal bathing and showering: Secondary | ICD-10-CM | POA: Insufficient documentation

## 2018-12-06 DIAGNOSIS — I10 Essential (primary) hypertension: Secondary | ICD-10-CM | POA: Insufficient documentation

## 2018-12-06 MED ORDER — IBUPROFEN 400 MG PO TABS
600.0000 mg | ORAL_TABLET | Freq: Once | ORAL | Status: AC
  Administered 2018-12-06: 600 mg via ORAL
  Filled 2018-12-06: qty 1

## 2018-12-06 NOTE — ED Triage Notes (Signed)
Pt c/o L knee pain after slip and fall last night when getting out of the shower.

## 2018-12-06 NOTE — ED Provider Notes (Signed)
[facility] EMERGENCY DEPARTMENT Provider Note   CSN: [id number] Arrival date & time: 12/06/18  0449    History   Chief Complaint Chief Complaint  Patient presents with  . Knee Injury    HPI Cathy Webster is a 28 y.o. female presenting to the emergency department with acute onset of medial left knee pain that began yesterday evening.  Patient states she was getting the shower and fell.  States she fell onto her left leg however it was flexed and tucked under her.  She had immediate pain to the medial aspect that has been worsening since that time.  States initially she was ambulatory, however throughout the night and upon awakening this morning pain worsened and she cannot bear weight or bend her knee without pain.  No medications tried prior to arrival.  No associated swelling.  No previous injury to the left knee.     The history is provided by the patient.    Past Medical History:  Diagnosis Date  . Abnormal Pap smear 2008  . Cystitis 04/09/16  . Hives   . Hypertension   . Preeclampsia   . UTI (lower urinary tract infection)     Patient Active Problem List   Diagnosis Date Noted  . Panic attack 10/09/2018  . Contraceptive management 04/09/2016  . Preeclampsia in postpartum period 03/01/2016  . Abnormal Pap smear of cervix 12/22/2013    Past Surgical History:  Procedure Laterality Date  . CESAREAN SECTION    . LAPAROSCOPY N/A 09/26/2018   Procedure: LAPAROSCOPY OPERATIVE WITH LEFT SALPINGECTOMY;  Surgeon: Reva Bores, MD;  Location: [facility];  Service: Gynecology;  Laterality: N/A;  . TONSILLECTOMY       OB History    Gravida  5   Para  4   Term  4   Preterm      AB  1   Living  4     SAB      TAB      Ectopic  1   Multiple  0   Live Births  4            Home Medications    Prior to Admission medications   Not on File    Family History Family History  Problem Relation Age of Onset  . Diabetes Mother   .  Hypertension Mother   . Asthma Brother     Social History Social History   Tobacco Use  . Smoking status: Never Smoker  . Smokeless tobacco: Never Used  Substance Use Topics  . Alcohol use: No  . Drug use: No     Allergies   Patient has no known allergies.   Review of Systems Review of Systems  Musculoskeletal: Positive for arthralgias. Negative for joint swelling.  Skin: Negative for color change and wound.     Physical Exam Updated Vital Signs BP 115/76   Pulse 81   Temp 98.3 F (36.8 C) (Oral)   Resp 18   Ht 5\' 6"  (1.676 m)   LMP 11/10/2018   SpO2 100%   BMI 43.84 kg/m   Physical Exam Vitals signs and nursing note reviewed.  Constitutional:      General: She is not in acute distress.    Appearance: She is well-developed.     Comments: Morbidly obese.  HENT:     Head: Normocephalic and atraumatic.  Eyes:     Conjunctiva/sclera: Conjunctivae normal.  Cardiovascular:     Rate and Rhythm:  Normal rate.  Pulmonary:     Effort: Pulmonary effort is normal.  Musculoskeletal:     Comments: Left knee appears normal, no swelling, wounds or color change. There is tenderness along the medical aspect. Special tests are limited 2/t body habitus and pain. Pt has significant pain with any passive flexion of the knee. Ankle and hip are normal.  Neurological:     Mental Status: She is alert.  Psychiatric:        Mood and Affect: Mood normal.        Behavior: Behavior normal.      ED Treatments / Results  Labs (all labs ordered are listed, but only abnormal results are displayed) Labs Reviewed - No data to display  EKG None  Radiology Dg Knee Complete 4 Views Left  Result Date: 12/06/2018 CLINICAL DATA:  28 year old female with fall and trauma to the left knee. EXAM: LEFT KNEE - COMPLETE 4+ VIEW COMPARISON:  Left knee radiograph dated 04/25/2018 FINDINGS: No evidence of fracture, dislocation, or joint effusion. No evidence of arthropathy or other focal bone  abnormality. Soft tissues are unremarkable. IMPRESSION: Negative. Electronically Signed   By: Elgie Collard M.D.   On: 12/06/2018 05:46    Procedures Procedures (including critical care time)  Medications Ordered in ED Medications  ibuprofen (ADVIL,MOTRIN) tablet 600 mg (600 mg Oral Given 12/06/18 0717)     Initial Impression / Assessment and Plan / ED Course  I have reviewed the triage vital signs and the nursing notes.  Pertinent labs & imaging results that were available during my care of the patient were reviewed by me and considered in my medical decision making (see chart for details).        Pt with knee injury after fall yesterday. Pain and tenderness to medial aspect. Unable to perform special tests 2/t body habitus and patient;'s pain w ROM. Xray is negative. Knee immobilizer applied with concern of sprain to MCL vs possible injury to meniscus. Instructed nonweightbearing, RICE therapy. Ortho referral provided for follow up.  Patient agreeable to plan and safe for discharge.  Discussed results, findings, treatment and follow up. Patient advised of return precautions. Patient verbalized understanding and agreed with plan.  Final Clinical Impressions(s) / ED Diagnoses   Final diagnoses:  Injury of left knee, initial encounter    ED Discharge Orders    None       , [place] N, PA-C 12/06/18 0735    Zadie Rhine, MD 12/06/18 [phone]

## 2018-12-06 NOTE — Progress Notes (Signed)
Orthopedic Tech Progress Note Patient Details:  Cathy Webster Oct 24, 1990 [id number]  Ortho Devices Type of Ortho Device: Crutches, Knee Immobilizer Ortho Device/Splint Location: LLE Ortho Device/Splint Interventions: Adjustment, Application, Ordered   Post Interventions Patient Tolerated: Well Instructions Provided: Poper ambulation with device, Care of device, Adjustment of device   Donald Pore 12/06/2018, 7:21 AM

## 2018-12-06 NOTE — Discharge Instructions (Signed)
Please read instructions below. Apply ice to your knee for 20 minutes at a time. Wear the brace at all times.  You can take ibuprofen every 6 hours as needed for pain. Schedule an appointment with the orthopedic specialist in 1-2 weeks for follow-up on your injury. Return to the ER for new or concerning symptoms.

## 2018-12-06 NOTE — ED Notes (Signed)
Pt requesting to stay in wheelchair while in room for comfort.

## 2018-12-06 NOTE — ED Notes (Signed)
ED Provider at bedside. 

## 2018-12-22 ENCOUNTER — Ambulatory Visit: Payer: Self-pay | Admitting: Family Medicine

## 2019-01-05 ENCOUNTER — Telehealth: Payer: Self-pay | Admitting: Family Medicine

## 2019-01-05 NOTE — Telephone Encounter (Signed)
Called and left a detailed message that do to COVID 19, we will call her to rsh this appt

## 2019-01-08 ENCOUNTER — Ambulatory Visit: Payer: Self-pay | Admitting: Family Medicine

## 2019-02-19 ENCOUNTER — Encounter: Payer: Self-pay | Admitting: *Deleted

## 2019-08-19 ENCOUNTER — Emergency Department
Admission: EM | Admit: 2019-08-19 | Discharge: 2019-08-19 | Disposition: A | Discharge status: Home / Self Care | Payer: BC Managed Care – PPO | Attending: Emergency Medicine | Admitting: Emergency Medicine

## 2019-08-19 ENCOUNTER — Encounter: Payer: Self-pay | Admitting: Emergency Medicine

## 2019-08-19 ENCOUNTER — Other Ambulatory Visit: Payer: Self-pay

## 2019-08-19 DIAGNOSIS — Z79899 Other long term (current) drug therapy: Secondary | ICD-10-CM | POA: Insufficient documentation

## 2019-08-19 DIAGNOSIS — Z20828 Contact with and (suspected) exposure to other viral communicable diseases: Secondary | ICD-10-CM | POA: Insufficient documentation

## 2019-08-19 DIAGNOSIS — I10 Essential (primary) hypertension: Secondary | ICD-10-CM | POA: Insufficient documentation

## 2019-08-19 DIAGNOSIS — J0100 Acute maxillary sinusitis, unspecified: Secondary | ICD-10-CM

## 2019-08-19 DIAGNOSIS — J0101 Acute recurrent maxillary sinusitis: Secondary | ICD-10-CM | POA: Diagnosis present

## 2019-08-19 LAB — SARS CORONAVIRUS 2 (TAT 6-24 HRS): SARS Coronavirus 2: NEGATIVE

## 2019-08-19 MED ORDER — PSEUDOEPHEDRINE HCL ER 120 MG PO TB12
120.0000 mg | ORAL_TABLET | Freq: Two times a day (BID) | ORAL | Status: DC
  Administered 2019-08-19: 120 mg via ORAL
  Filled 2019-08-19 (×2): qty 1

## 2019-08-19 MED ORDER — BENZONATATE 100 MG PO CAPS: ORAL_CAPSULE | ORAL | 0 refills | Status: DC

## 2019-08-19 MED ORDER — FLUTICASONE PROPIONATE 50 MCG/ACT NA SUSP: 2.0000 | Freq: Every day | NASAL | 0 refills | Status: DC

## 2019-08-19 MED ORDER — PREDNISONE 20 MG PO TABS: 20.0000 mg | ORAL_TABLET | Freq: Two times a day (BID) | ORAL | 0 refills | Status: AC

## 2019-08-19 NOTE — ED Provider Notes (Signed)
[facility] Emergency Department Provider Note ____________________________________________  Time seen: 1319  I have reviewed the triage vital signs and the nursing notes.  HISTORY  Chief Complaint  Recurrent Sinusitis  HPI Cathy Webster is a 28 y.o. female presents herself to the ED for evaluation of onset of headache, sinus pressure, and malaise for the last 2 days.  Patient describes onset while at work 2 days prior, she was also notified yesterday, that someone in her building was Covid positive.  She is unclear of the level of exposure to that particular patient.  She presents today denying any interim fevers, but describing pressure to the mid face and behind the eyes.  She also reports some sneezing, watery eyes, and runny nose.    Past Medical History:  Diagnosis Date  . Abnormal Pap smear 2008  . Cystitis 04/09/16  . Hives   . Hypertension   . Preeclampsia   . UTI (lower urinary tract infection)     Patient Active Problem List   Diagnosis Date Noted  . Panic attack 10/09/2018  . Contraceptive management 04/09/2016  . Preeclampsia in postpartum period 03/01/2016  . Abnormal Pap smear of cervix 12/22/2013    Past Surgical History:  Procedure Laterality Date  . CESAREAN SECTION    . LAPAROSCOPY N/A 09/26/2018   Procedure: LAPAROSCOPY OPERATIVE WITH LEFT SALPINGECTOMY;  Surgeon: Donnamae Jude, MD;  Location: [facility];  Service: Gynecology;  Laterality: N/A;  . TONSILLECTOMY      Prior to Admission medications   Medication Sig Start Date End Date Taking? Authorizing Provider  benzonatate (TESSALON PERLES) 100 MG capsule Take 1-2 tabs TID prn cough 08/19/19   Fedora Knisely, Dannielle Karvonen, PA-C  fluticasone (FLONASE) 50 MCG/ACT nasal spray Place 2 sprays into both nostrils daily. 08/19/19   Seidy Labreck, Dannielle Karvonen, PA-C  predniSONE (DELTASONE) 20 MG tablet Take 1 tablet (20 mg total) by mouth 2 (two) times daily with a meal for 5 days. 08/19/19  08/24/19  Parul Porcelli, Dannielle Karvonen, PA-C    Allergies Patient has no known allergies.  Family History  Problem Relation Age of Onset  . Diabetes Mother   . Hypertension Mother   . Asthma Brother     Social History Social History   Tobacco Use  . Smoking status: Never Smoker  . Smokeless tobacco: Never Used  Substance Use Topics  . Alcohol use: No  . Drug use: No    Review of Systems  Constitutional: Negative for fever. Eyes: Negative for visual changes. ENT: Negative for sore throat.  Reports sinus pressure and runny nose as above. Cardiovascular: Negative for chest pain. Respiratory: Negative for shortness of breath. Gastrointestinal: Negative for abdominal pain, vomiting and diarrhea. Genitourinary: Negative for dysuria. Musculoskeletal: Negative for back pain. Skin: Negative for rash. Neurological: Negative for headaches, focal weakness or numbness. ____________________________________________  PHYSICAL EXAM:  VITAL SIGNS: ED Triage Vitals  Enc Vitals Group     BP 08/19/19 1227 125/80     Pulse Rate 08/19/19 1227 86     Resp 08/19/19 1227 16     Temp 08/19/19 1227 98.2 F (36.8 C)     Temp Source 08/19/19 1227 Oral     SpO2 08/19/19 1227 98 %     Weight 08/19/19 1225 270 lb (122.5 kg)     Height 08/19/19 1225 5\' 6"  (1.676 m)     Head Circumference --      Peak Flow --      Pain Score  08/19/19 1225 8     Pain Loc --      Pain Edu? --      Excl. in GC? --     Constitutional: Alert and oriented. Well appearing and in no distress.  GCS = 15 Head: Normocephalic and atraumatic. Eyes: Conjunctivae are normal. PERRL. Normal extraocular movements Ears: Canals clear. TMs intact bilaterally. Nose: No congestion/rhinorrhea/epistaxis.  Left nostril with edematous, pink, moist turbinates noted.  The right nostril is patent. Mouth/Throat: Mucous membranes are moist.  Uvula is midline no oropharyngeal lesions are appreciated. Neck: Supple. No  thyromegaly. Hematological/Lymphatic/Immunological: No cervical lymphadenopathy. Cardiovascular: Normal rate, regular rhythm. Normal distal pulses. Respiratory: Normal respiratory effort. No wheezes/rales/rhonchi. Gastrointestinal: Soft and nontender. No distention. Musculoskeletal: Nontender with normal range of motion in all extremities.  Neurologic:  Normal gait without ataxia. Normal speech and language. No gross focal neurologic deficits are appreciated. Skin:  Skin is warm, dry and intact. No rash noted. ____________________________________________   LABS (pertinent positives/negatives)  Labs Reviewed  SARS CORONAVIRUS 2 (TAT 6-24 HRS)  ____________________________________________  PROCEDURES  Pseudoephedrine 120 mg PO Procedures ____________________________________________  INITIAL IMPRESSION / ASSESSMENT AND PLAN / ED COURSE  Patient with ED evaluation of headache, sinus drainage, and sinus congestion.  Clinical picture is consistent with a likely sinusitis.  No occasions of any acute infectious etiology.  Patient was treated with pseudoephedrine and a Covid test is pending at the time of discharge secondary to her exposure.  She will be discharged with prescriptions for Flonase, Tessalon Perles, and prednisone.  She is also to take over-the-counter decongestant as described.  She will follow with primary provider return to the ED as needed.  Work was provided for 1 day as requested.  Cathy Webster was evaluated in Emergency Department on 08/19/2019 for the symptoms described in the history of present illness. She was evaluated in the context of the global COVID-19 pandemic, which necessitated consideration that the patient might be at risk for infection with the SARS-CoV-2 virus that causes COVID-19. Institutional protocols and algorithms that pertain to the evaluation of patients at risk for COVID-19 are in a state of rapid change based on information released by regulatory  bodies including the CDC and federal and state organizations. These policies and algorithms were followed during the patient's care in the ED. ____________________________________________  FINAL CLINICAL IMPRESSION(S) / ED DIAGNOSES  Final diagnoses:  Acute non-recurrent maxillary sinusitis      Karmen Stabs, Charlesetta Ivory, PA-C 08/19/19 1748    Emily Filbert, MD 08/20/19 1530

## 2019-08-19 NOTE — ED Triage Notes (Signed)
Pt in via POV, reports headache, sinus pressure x 2 days.  NAD at this time.

## 2019-08-19 NOTE — Discharge Instructions (Addendum)
You are being screened for COVID. Your test is pending at this time. You are being treated for sinus pressure. Take the prescription meds as directed. Take OTC pseudoephedrine for addition sinus pressure relief. Remain at home until your test results are verified. Follow-up with [facility] as needed.

## 2019-08-19 NOTE — ED Notes (Signed)
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

## 2019-09-15 ENCOUNTER — Other Ambulatory Visit: Payer: Self-pay

## 2019-09-15 ENCOUNTER — Ambulatory Visit (INDEPENDENT_AMBULATORY_CARE_PROVIDER_SITE_OTHER): Payer: BC Managed Care – PPO | Admitting: Student

## 2019-09-15 ENCOUNTER — Other Ambulatory Visit (HOSPITAL_COMMUNITY)
Admission: RE | Admit: 2019-09-15 | Discharge: 2019-09-15 | Disposition: A | Discharge status: Home / Self Care | Payer: BC Managed Care – PPO | Source: Ambulatory Visit | Attending: Family Medicine | Admitting: Family Medicine

## 2019-09-15 ENCOUNTER — Encounter: Payer: Self-pay | Admitting: Student

## 2019-09-15 VITALS — BP 132/70 | HR 93 | Ht 66.0 in | Wt 301.2 lb

## 2019-09-15 DIAGNOSIS — Z30431 Encounter for routine checking of intrauterine contraceptive device: Secondary | ICD-10-CM | POA: Insufficient documentation

## 2019-09-15 DIAGNOSIS — Z01419 Encounter for gynecological examination (general) (routine) without abnormal findings: Secondary | ICD-10-CM

## 2019-09-15 MED ORDER — IBUPROFEN 200 MG PO TABS
600.0000 mg | ORAL_TABLET | Freq: Once | ORAL | Status: AC
  Administered 2019-09-15: 600 mg via ORAL

## 2019-09-15 NOTE — Progress Notes (Signed)
History:  Ms. Kerensa Nicklas is a 28 y.o. [id number] who presents to clinic today for IUD removal and reinsertion as well as pap smear.   The following portions of the patient's history were reviewed and updated as appropriate: allergies, current medications, family history, past medical history, social history, past surgical history and problem list.  Review of Systems:  Review of Systems  Constitutional: Negative.   HENT: Negative.   Respiratory: Negative.   Cardiovascular: Negative.   Genitourinary: Negative.   Musculoskeletal: Negative.   Skin: Negative.   Neurological: Negative.   Psychiatric/Behavioral: Negative.       Objective:  Physical Exam BP 132/70   Pulse 93   Ht 5\' 6"  (1.676 m)   Wt (!) 301 lb 3.2 oz (136.6 kg)   LMP 09/18/2018 (LMP Unknown)   BMI 48.61 kg/m  Physical Exam  Constitutional: She is oriented to person, place, and time. She appears well-developed.  HENT:  Head: Normocephalic.  Neck: Normal range of motion.  Respiratory: Effort normal.  GI: Soft.  Genitourinary:    Genitourinary Comments: NEFG: vaginal walls pink with no lesions or discharge; unable to visualize cervix so pap collected blindly; IUD strings palpated.    Musculoskeletal: Normal range of motion.  Neurological: She is alert and oriented to person, place, and time.  Skin: Skin is warm and dry.      Labs and Imaging No results found for this or any previous visit (from the past 24 hour(s)).  No results found.   Assessment & Plan:  1. Women's annual routine gynecological examination -Patient has [place], does not need removal as Liletta now approved for 6 years, and Liletta was placed in 2017.  - Cytology - PAP( [place]) - ibuprofen (ADVIL) tablet 600 mg   Starr Lake, CNM 09/15/2019 4:10 PM

## 2019-09-16 LAB — CYTOLOGY - PAP: Diagnosis: NEGATIVE

## 2019-09-17 ENCOUNTER — Ambulatory Visit: Payer: BC Managed Care – PPO | Admitting: Family Medicine

## 2019-10-14 ENCOUNTER — Ambulatory Visit: Payer: BC Managed Care – PPO | Attending: Internal Medicine

## 2019-10-14 DIAGNOSIS — Z20822 Contact with and (suspected) exposure to covid-19: Secondary | ICD-10-CM

## 2019-10-15 LAB — NOVEL CORONAVIRUS, NAA: SARS-CoV-2, NAA: NOT DETECTED

## 2019-10-16 ENCOUNTER — Telehealth: Payer: Self-pay | Admitting: *Deleted

## 2019-10-16 ENCOUNTER — Telehealth: Payer: Self-pay

## 2019-10-16 NOTE — Telephone Encounter (Signed)
Called pt regarding questions she had about covid 19. She tested negative  but her 3 year tested positive.  The patient is having symptoms of diarrhea. Advised her to treat herself like she would if she was positive. She is quarantine for 10 days with her daughter. She is advised to talk with her pcp, but she does not have one.  she also stated that she has tingly feeling sometimes on one side of her body when she is lying down. She should do an e visit and speak with a provider if it continues. If she continues to have symptoms, she will go get retested.

## 2019-10-20 ENCOUNTER — Ambulatory Visit: Payer: BC Managed Care – PPO | Attending: Internal Medicine

## 2019-10-20 DIAGNOSIS — Z20822 Contact with and (suspected) exposure to covid-19: Secondary | ICD-10-CM

## 2019-10-20 DIAGNOSIS — U071 COVID-19: Secondary | ICD-10-CM

## 2019-10-20 HISTORY — DX: COVID-19: U07.1

## 2019-10-22 ENCOUNTER — Ambulatory Visit (INDEPENDENT_AMBULATORY_CARE_PROVIDER_SITE_OTHER)
Admission: RE | Admit: 2019-10-22 | Discharge: 2019-10-22 | Disposition: A | Discharge status: Home / Self Care | Payer: BC Managed Care – PPO | Source: Ambulatory Visit

## 2019-10-22 ENCOUNTER — Other Ambulatory Visit: Payer: Self-pay

## 2019-10-22 ENCOUNTER — Telehealth: Payer: Self-pay | Admitting: Family Medicine

## 2019-10-22 ENCOUNTER — Emergency Department
Admission: EM | Admit: 2019-10-22 | Discharge: 2019-10-22 | Disposition: A | Discharge status: Home / Self Care | Payer: BC Managed Care – PPO | Attending: Student | Admitting: Student

## 2019-10-22 ENCOUNTER — Emergency Department: Payer: BC Managed Care – PPO

## 2019-10-22 DIAGNOSIS — U071 COVID-19: Secondary | ICD-10-CM | POA: Diagnosis not present

## 2019-10-22 DIAGNOSIS — I10 Essential (primary) hypertension: Secondary | ICD-10-CM | POA: Insufficient documentation

## 2019-10-22 DIAGNOSIS — R21 Rash and other nonspecific skin eruption: Secondary | ICD-10-CM | POA: Diagnosis not present

## 2019-10-22 DIAGNOSIS — R202 Paresthesia of skin: Secondary | ICD-10-CM | POA: Insufficient documentation

## 2019-10-22 DIAGNOSIS — Z79899 Other long term (current) drug therapy: Secondary | ICD-10-CM | POA: Diagnosis not present

## 2019-10-22 DIAGNOSIS — G441 Vascular headache, not elsewhere classified: Secondary | ICD-10-CM | POA: Diagnosis not present

## 2019-10-22 DIAGNOSIS — R200 Anesthesia of skin: Secondary | ICD-10-CM

## 2019-10-22 LAB — NOVEL CORONAVIRUS, NAA: SARS-CoV-2, NAA: DETECTED — AB

## 2019-10-22 MED ORDER — HYDROXYZINE HCL 50 MG PO TABS: 50.0000 mg | ORAL_TABLET | Freq: Three times a day (TID) | ORAL | 0 refills | Status: AC | PRN

## 2019-10-22 MED ORDER — PREDNISONE 20 MG PO TABS
60.0000 mg | ORAL_TABLET | Freq: Once | ORAL | Status: AC
  Administered 2019-10-22: 13:00:00 60 mg via ORAL
  Filled 2019-10-22: qty 3

## 2019-10-22 MED ORDER — HYDROXYZINE HCL 50 MG PO TABS
50.0000 mg | ORAL_TABLET | Freq: Once | ORAL | Status: AC
  Administered 2019-10-22: 13:00:00 50 mg via ORAL
  Filled 2019-10-22: qty 1

## 2019-10-22 MED ORDER — METHYLPREDNISOLONE 4 MG PO TBPK: ORAL_TABLET | ORAL | 0 refills | Status: DC

## 2019-10-22 NOTE — Telephone Encounter (Signed)
    Patient called about Positive Covid test from date testing event.   Symptoms are mild at this time. Patient understands the needs to stay in isolation for a total of 10 days from onset of symptom or 14 days total from date of testing if no symptom. Patient knows the health department may be in touch.  I answered all of patient's questions and all concerns addressed.   Patient is complaining of new onset rash and widespread numbness and tingling.  The patient was given clear instructions to go to ER  if symptoms do not improve, worsen or new problems develop. The patient verbalized understanding.     Nolon Nations  APRN, MSN, FNP-C Patient Care [facility] [address] [place], [place] [address] [phone]

## 2019-10-22 NOTE — Discharge Instructions (Addendum)
Your CT was negative for acute findings.  Follow discharge care instruction take medication as directed. Advised he was quarantine additional 10 days since your COVID-19 test was positive. Cathy Webster

## 2019-10-22 NOTE — ED Triage Notes (Signed)
Pt states she received her covid positive test today and states when she spoke with the nurse on the phone she explained she is having tingling all over and a itchy rash on on her abd, pt is in not in any distress, pt is ambulatory to triage with a steady gait and speaking in complete sentences with clear speech.

## 2019-10-22 NOTE — Discharge Instructions (Signed)
Unable to rule out blood clot secondary to COVID diagnosis in urgent care and over video visit.  Recommended further evaluation and management in the ED.  Patient aware and in agreement with plan.  Declines EMS transport.  Will go by private vehicle with husband to closest ED.

## 2019-10-22 NOTE — ED Notes (Signed)
Pt c/o tingling when she lays down. Tested positive for covid with symptoms starting Sunday and test coming back positive today. Is talking in complete symptoms with no sign of sob. No cough noted.

## 2019-10-22 NOTE — ED Provider Notes (Signed)
[facility] Emergency Department Provider Note   ____________________________________________   First MD Initiated Contact with Patient 10/22/19 1135     (approximate)  I have reviewed the triage vital signs and the nursing notes.   HISTORY  Chief Complaint Numbness (tinlging)    HPI Cathy Webster is a 29 y.o. female patient presents with right-sided headache and diffuse tingling for 2 days.  Patient also has a rash to the right abdomen.  Patient states she was exposed to COVID-19 by her daughter and tested positive today.  Patient stated the headache and tingling was before the diagnosis patient denies vision disturbance or vertigo.  Patient rates the headache a 10/10.  Patient states this is her worst headache.  No palliative measure for complaints.     Past Medical History:  Diagnosis Date  . Abnormal Pap smear 2008  . Cystitis 04/09/16  . Hives   . Hypertension   . Preeclampsia   . UTI (lower urinary tract infection)     Patient Active Problem List   Diagnosis Date Noted  . IUD check up 09/15/2019  . Panic attack 10/09/2018  . Contraceptive management 04/09/2016  . Preeclampsia in postpartum period 03/01/2016  . Abnormal Pap smear of cervix 12/22/2013    Past Surgical History:  Procedure Laterality Date  . CESAREAN SECTION    . LAPAROSCOPY N/A 09/26/2018   Procedure: LAPAROSCOPY OPERATIVE WITH LEFT SALPINGECTOMY;  Surgeon: Reva Bores, MD;  Location: [facility];  Service: Gynecology;  Laterality: N/A;  . TONSILLECTOMY      Prior to Admission medications   Medication Sig Start Date End Date Taking? Authorizing Provider  hydrOXYzine (ATARAX/VISTARIL) 50 MG tablet Take 1 tablet (50 mg total) by mouth 3 (three) times daily as needed for itching. 10/22/19   Joni Reining, PA-C  methylPREDNISolone (MEDROL DOSEPAK) 4 MG TBPK tablet Take Tapered dose as directed.  First dose in the morning. 10/22/19   Joni Reining, PA-C  fluticasone  (FLONASE) 50 MCG/ACT nasal spray Place 2 sprays into both nostrils daily. 08/19/19 10/22/19  Menshew, Charlesetta Ivory, PA-C    Allergies Patient has no known allergies.  Family History  Problem Relation Age of Onset  . Diabetes Mother   . Hypertension Mother   . Asthma Brother     Social History Social History   Tobacco Use  . Smoking status: Never Smoker  . Smokeless tobacco: Never Used  Substance Use Topics  . Alcohol use: No  . Drug use: No    Review of Systems Constitutional: No fever/chills Eyes: No visual changes. ENT: No sore throat. Cardiovascular: Denies chest pain. Respiratory: Denies shortness of breath. Gastrointestinal: No abdominal pain.  No nausea, no vomiting.  No diarrhea.  No constipation. Genitourinary: Negative for dysuria. Musculoskeletal: Negative for back pain. Skin: Negative for rash. Neurological: Positive for headaches, but denies focal weakness or numbness. Endocrine:  Hypertension  ____________________________________________   PHYSICAL EXAM:  VITAL SIGNS: ED Triage Vitals  Enc Vitals Group     BP 10/22/19 1130 130/81     Pulse Rate 10/22/19 1130 75     Resp 10/22/19 1130 16     Temp 10/22/19 1130 98 F (36.7 C)     Temp Source 10/22/19 1130 Oral     SpO2 10/22/19 1130 100 %     Weight 10/22/19 1131 280 lb (127 kg)     Height 10/22/19 1131 5\' 6"  (1.676 m)     Head Circumference --  Peak Flow --      Pain Score 10/22/19 1131 10     Pain Loc --      Pain Edu? --      Excl. in GC? --     Constitutional: Alert and oriented. Well appearing and in no acute distress. Eyes: Conjunctivae are normal. PERRL. EOMI. Head: Atraumatic. Nose: No congestion/rhinnorhea. Mouth/Throat: Mucous membranes are moist.  Oropharynx non-erythematous. Neck: No stridor.   Hematological/Lymphatic/Immunilogical: No cervical lymphadenopathy. Cardiovascular: Normal rate, regular rhythm. Grossly normal heart sounds.  Good peripheral  circulation. Respiratory: Normal respiratory effort.  No retractions. Lungs CTAB. Gastrointestinal: Soft and nontender. No distention. No abdominal bruits. No CVA tenderness. Neurologic:  Normal speech and language. No gross focal neurologic deficits are appreciated. No gait instability. Skin:  Skin is warm, dry and intact.  Erythematous papular lesions skin folds of the right abdomen.   Psychiatric: Mood and affect are normal. Speech and behavior are normal.  ____________________________________________   LABS (all labs ordered are listed, but only abnormal results are displayed)  Labs Reviewed - No data to display ____________________________________________  EKG   ____________________________________________  RADIOLOGY  ED MD interpretation:    Official radiology report(s): CT Head Wo Contrast  Result Date: 10/22/2019 CLINICAL DATA:  Headaches EXAM: CT HEAD WITHOUT CONTRAST TECHNIQUE: Contiguous axial images were obtained from the base of the skull through the vertex without intravenous contrast. COMPARISON:  None. FINDINGS: Brain: No evidence of acute infarction, hemorrhage, hydrocephalus, extra-axial collection or mass lesion/mass effect. Vascular: No hyperdense vessel or unexpected calcification. Skull: No osseous abnormality. Sinuses/Orbits: Visualized paranasal sinuses are clear. Visualized mastoid sinuses are clear. Visualized orbits demonstrate no focal abnormality. Other: None : No acute intracranial pathology. Electronically Signed   By: Elige Ko   On: 10/22/2019 12:18    ____________________________________________   PROCEDURES  Procedure(s) performed (including Critical Care):  Procedures   ____________________________________________   INITIAL IMPRESSION / ASSESSMENT AND PLAN / ED COURSE  As part of my medical decision making, I reviewed the following data within the electronic MEDICAL RECORD NUMBER     Patient presents with headache, generalized body  tingling, and abdominal rash.  Discussed negative CT findings with patient.  Patient given discharge care instruction take medication as directed.    Cathy Webster was evaluated in Emergency Department on 10/22/2019 for the symptoms described in the history of present illness. She was evaluated in the context of the global COVID-19 pandemic, which necessitated consideration that the patient might be at risk for infection with the SARS-CoV-2 virus that causes COVID-19. Institutional protocols and algorithms that pertain to the evaluation of patients at risk for COVID-19 are in a state of rapid change based on information released by regulatory bodies including the CDC and federal and state organizations. These policies and algorithms were followed during the patient's care in the ED.       ____________________________________________   FINAL CLINICAL IMPRESSION(S) / ED DIAGNOSES  Final diagnoses:  Paresthesias  Rash, skin  Other vascular headache     ED Discharge Orders         Ordered    methylPREDNISolone (MEDROL DOSEPAK) 4 MG TBPK tablet     10/22/19 1237    hydrOXYzine (ATARAX/VISTARIL) 50 MG tablet  3 times daily PRN     10/22/19 1237           Note:  This document was prepared using Dragon voice recognition software and may include unintentional dictation errors.    Joni Reining, PA-C 10/22/19  1240    Lilia Pro., MD 10/22/19 [phone]

## 2019-10-22 NOTE — ED Provider Notes (Signed)
[facility] CARE CENTER    Virtual Visit via Video Note:  Zyasia Halbleib  initiated request for Telemedicine visit with [facility] Urgent Care team. I connected with Judye Bos  on 10/22/2019 at 9:40 AM  for a synchronized telemedicine visit using a video enabled HIPPA compliant telemedicine application. I verified that I am speaking with Judye Bos  using two identifiers. Rennis Harding, PA-C  was physically located in a [facility] Urgent care site and Nilah Belcourt was located at a different location.   The limitations of evaluation and management by telemedicine as well as the availability of in-person appointments were discussed. Patient was informed that she  may incur a bill ( including co-pay) for this virtual visit encounter. Judye Bos  expressed understanding and gave verbal consent to proceed with virtual visit.  [phone] 10/22/19 Arrival Time: 2703  Cc: COVID positive   SUBJECTIVE:  Cathy Webster is a 29 y.o. female who presents with headache, loss of taste and smell, body aches, itchy rash (small raised and red) on abdomen, and new onset (x 1 day) numbness/ tingling "throughout entire body," that began 4 days.  Tested positive for COVID today.  Daughter tested positive for COVID on 10/06/2019.  Has tried ibuprofen and sudafed without relief.  Numbness/ tingling are made worse with taking shower.  Denies previous symptoms in the past.  Complains of associated sinus pain/pressure, fatigue, itchy throat, rhinorrhea, nausea, and diarrhea. Denies fever, chills, SOB, wheezing, chest pain, nausea, changes in bladder habits.    Family hx of stroke in mother  ROS: As per HPI.  All other pertinent ROS negative.     Past Medical History:  Diagnosis Date  . Abnormal Pap smear 2008  . Cystitis 04/09/16  . Hives   . Hypertension   . Preeclampsia   . UTI (lower urinary tract infection)    Past Surgical History:  Procedure Laterality Date  .  CESAREAN SECTION    . LAPAROSCOPY N/A 09/26/2018   Procedure: LAPAROSCOPY OPERATIVE WITH LEFT SALPINGECTOMY;  Surgeon: Reva Bores, MD;  Location: [facility];  Service: Gynecology;  Laterality: N/A;  . TONSILLECTOMY     No Known Allergies No current facility-administered medications on file prior to encounter.   Current Outpatient Medications on File Prior to Encounter  Medication Sig Dispense Refill  . [DISCONTINUED] fluticasone (FLONASE) 50 MCG/ACT nasal spray Place 2 sprays into both nostrils daily. 16 g 0      OBJECTIVE:  There were no vitals filed for this visit.   General appearance: alert; no distress Eyes: EOMI grossly HENT: normocephalic; atraumatic Neck: supple with FROM Lungs: normal respiratory effort; speaking in full sentences without difficulty Extremities: moves extremities without difficulty Skin: No obvious rashes; erythematous rash, pinpoint localized to abdomen (video quality poor) Neurologic: No facial asymmetries Psychological: alert and cooperative; anxious mood and affect   ASSESSMENT & PLAN:  1. COVID-19 virus infection   2. Numbness and tingling     Unable to rule out blood clot secondary to COVID diagnosis in urgent care and over video visit.  Recommended further evaluation and management in the ED.  Patient aware and in agreement with plan.  Declines EMS transport.  Will go by private vehicle with husband to closest ED.    I discussed the assessment and treatment plan with the patient. The patient was provided an opportunity to ask questions and all were answered. The patient agreed with the plan and demonstrated an understanding of the instructions.   The patient was  advised to call back or seek an in-person evaluation if the symptoms worsen or if the condition fails to improve as anticipated.  I provided 10 minutes of non-face-to-face time during this encounter.  Lestine Box, PA-C  10/22/2019 9:40 AM           Lestine Box,  PA-C 10/22/19 [phone]

## 2019-12-23 ENCOUNTER — Ambulatory Visit
Admission: RE | Admit: 2019-12-23 | Discharge: 2019-12-23 | Disposition: A | Discharge status: Home / Self Care | Payer: BC Managed Care – PPO | Source: Ambulatory Visit

## 2019-12-23 DIAGNOSIS — K5900 Constipation, unspecified: Secondary | ICD-10-CM

## 2019-12-23 NOTE — Discharge Instructions (Addendum)
Most likely constipation.  Treating with miralax.  Follow up as needed for continued or worsening symptoms

## 2019-12-23 NOTE — ED Provider Notes (Signed)
Virtual Visit via Video Note:  Cathy Webster  initiated request for Telemedicine visit with [facility] Urgent Care team. I connected with Cathy Webster  on 12/23/2019 at 10:04 AM  for a synchronized telemedicine visit using a video enabled HIPPA compliant telemedicine application. I verified that I am speaking with Cathy Webster  using two identifiers. Cathy Aris, NP  was physically located in a [facility] Urgent care site and Cathy Webster was located at a different location.   The limitations of evaluation and management by telemedicine as well as the availability of in-person appointments were discussed. Patient was informed that she  may incur a bill ( including co-pay) for this virtual visit encounter. Cathy Webster  expressed understanding and gave verbal consent to proceed with virtual visit.     History of Present Illness:Cathy Webster  is a 29 y.o. female presents with 2 days of abdominal pain. Worsened yesterday. Reporting left middle abdominal pain. Sharp in nature. sts mild back pain also on same side. The pain is constant. Took some tylenol extra strength 1 hour ago. Denies any N,V,D. Last BM 30 minutes ago. Stool has been softer. Hx of constipation. No hematuria, dysuria, urinary frequency. sts she has IUD. No fever. No vaginal symptoms. She has been passing gas. Hx of trapped gas.    Past Medical History:  Diagnosis Date  . Abnormal Pap smear 2008  . Cystitis 04/09/16  . Hives   . Hypertension   . Preeclampsia   . UTI (lower urinary tract infection)     No Known Allergies      Observations/Objective: VITALS: Per patient if applicable, see vitals. GENERAL: Alert, appears well and in no acute distress. HEENT: Atraumatic, conjunctiva clear, no obvious abnormalities on inspection of external nose and ears. NECK: Normal movements of the head and neck. CARDIOPULMONARY: No increased WOB. Speaking in clear sentences. I:E ratio WNL.  MS: Moves all  visible extremities without noticeable abnormality. PSYCH: Pleasant and cooperative, well-groomed. Speech normal rate and rhythm. Affect is appropriate. Insight and judgement are appropriate. Attention is focused, linear, and appropriate.  NEURO: CN grossly intact. Oriented as arrived to appointment on time with no prompting. Moves both UE equally.  SKIN: No obvious lesions, wounds, erythema, or cyanosis noted on face or hands.     Assessment and Plan: Most likely constipation based on symptoms We will have her treat with MiraLAX   Follow Up Instructions: Follow up as needed for continued or worsening symptoms     I discussed the assessment and treatment plan with the patient. The patient was provided an opportunity to ask questions and all were answered. The patient agreed with the plan and demonstrated an understanding of the instructions.   The patient was advised to call back or seek an in-person evaluation if the symptoms worsen or if the condition fails to improve as anticipated.    Cathy Aris, NP  12/23/2019 10:04 AM         Cathy Aris, NP 12/23/19 1011

## 2020-02-24 ENCOUNTER — Telehealth: Payer: BC Managed Care – PPO | Admitting: Emergency Medicine

## 2020-02-24 DIAGNOSIS — R109 Unspecified abdominal pain: Secondary | ICD-10-CM

## 2020-02-25 NOTE — Progress Notes (Signed)
Based on what you shared with me, I feel your condition warrants further evaluation and I recommend that you be seen for a face to face office visit.  If you are having abdominal pain and back pain in the setting of changes in urination, you are at risk for kidney stone, kidney infection, or bladder infection. With sweet smelling urine, you could be at risk for new onset diabetes.  I recommend that you be seen in one of our urgent cares and have a formal urinalysis with a proper physical exam and may need blood work to assess your kidney function.   NOTE: If you entered your credit card information for this eVisit, you will not be charged. You may see a "hold" on your card for the $35 but that hold will drop off and you will not have a charge processed.   If you are having a true medical emergency please call 911.      For an urgent face to face visit, [facility] has five urgent care centers for your convenience:      NEW:  [facility] Center at [facility] Directions [phone] [address] [place], [place] [address] . 10 am - 6pm Monday - Friday    [facility]gent [facility]) Get Driving Directions [phone] [address] [place], [place] [address] . 10 am to 8 pm Monday-Friday . 12 pm to 8 pm [facility] Urgent Care at [facility] Get Driving Directions [phone] [address], [address] [place], [place] [address] . 8 am to 8 pm Monday-Friday . 9 am to 6 pm Saturday . 11 am to 6 pm Sunday     [facility] at [facility] Get Driving Directions  [phone] [address] [place], [place] [address] . 8 am to 8 pm Monday-Friday . 8 am to 4 pm [facility] Urgent Care at [facility] Directions [phone] [address] [place], [place] [address] . 12 pm to 6 pm Monday-Friday      Your e-visit answers were  reviewed by a board certified advanced clinical practitioner to complete your personal care plan.  Thank you for using e-Visits.   Approximately 5 minutes was used in reviewing the patient's chart, questionnaire, prescribing medications, and documentation.

## 2020-03-01 ENCOUNTER — Telehealth: Payer: Self-pay | Admitting: Lactation Services

## 2020-03-01 NOTE — Telephone Encounter (Signed)
Called patient in regards to appointment request due to smell of her urine. Was not able to reach patient. Mailbox full and not able to leave a message.  Will send My Chart Message.

## 2020-03-03 ENCOUNTER — Ambulatory Visit (INDEPENDENT_AMBULATORY_CARE_PROVIDER_SITE_OTHER): Payer: BC Managed Care – PPO

## 2020-03-03 ENCOUNTER — Other Ambulatory Visit: Payer: Self-pay

## 2020-03-03 DIAGNOSIS — R8290 Unspecified abnormal findings in urine: Secondary | ICD-10-CM

## 2020-03-03 LAB — POCT URINALYSIS DIP (DEVICE)
Bilirubin Urine: NEGATIVE
Glucose, UA: NEGATIVE mg/dL
Hgb urine dipstick: NEGATIVE
Ketones, ur: NEGATIVE mg/dL
Leukocytes,Ua: NEGATIVE
Nitrite: NEGATIVE
Protein, ur: NEGATIVE mg/dL
Specific Gravity, Urine: >=1.03 (ref 1.005–1.030)
Urobilinogen, UA: 1 mg/dL (ref 0.0–1.0)
pH: 6 (ref 5.0–8.0)

## 2020-03-03 NOTE — Progress Notes (Signed)
Pt here today with concern for possible UTI. Pt states that since COVID-19 she has been unable to distinguish all smells. Pt reports urine smells sweet and is concerned she has a UTI or another complication. Pt denies any frequency, burning or pain with urination. Urine specimen collected. UA not suspicious for UTI, no glucose present in urine. Results given to pt.    Pt states she does not have a PCP but is concerned for her overall health. List of PCPs given to patient. Pt encouraged to make a new patient appt and to follow up with our office as needed.  Fleet Contras RN 03/03/20

## 2020-03-03 NOTE — Patient Instructions (Addendum)
[facility] primary care offices accepting new patients:   [facility] [place] [address] [place], [place] [address] [phone]  [facility] [place] [address] [place], [place] [address] [phone]  [facility] [address] [place], [place] [address] [phone]  [facility] [address] [place], [place] [address] [phone]  [facility] [address] [place],  [place]  [address] [phone]  

## 2020-03-07 ENCOUNTER — Emergency Department
Admission: EM | Admit: 2020-03-07 | Discharge: 2020-03-07 | Disposition: A | Discharge status: Home / Self Care | Payer: BC Managed Care – PPO | Attending: Emergency Medicine | Admitting: Emergency Medicine

## 2020-03-07 ENCOUNTER — Encounter: Payer: Self-pay | Admitting: Emergency Medicine

## 2020-03-07 DIAGNOSIS — R42 Dizziness and giddiness: Secondary | ICD-10-CM | POA: Insufficient documentation

## 2020-03-07 DIAGNOSIS — Z8616 Personal history of COVID-19: Secondary | ICD-10-CM | POA: Diagnosis not present

## 2020-03-07 DIAGNOSIS — R1084 Generalized abdominal pain: Secondary | ICD-10-CM | POA: Diagnosis not present

## 2020-03-07 DIAGNOSIS — R197 Diarrhea, unspecified: Secondary | ICD-10-CM | POA: Insufficient documentation

## 2020-03-07 DIAGNOSIS — I10 Essential (primary) hypertension: Secondary | ICD-10-CM | POA: Diagnosis not present

## 2020-03-07 LAB — CBC
HCT: 37.6 % (ref 36.0–46.0)
Hemoglobin: 12.5 g/dL (ref 12.0–15.0)
MCH: 28.3 pg (ref 26.0–34.0)
MCHC: 33.2 g/dL (ref 30.0–36.0)
MCV: 85.1 fL (ref 80.0–100.0)
Platelets: 223 10*3/uL (ref 150–400)
RBC: 4.42 MIL/uL (ref 3.87–5.11)
RDW: 12.3 % (ref 11.5–15.5)
WBC: 6.5 10*3/uL (ref 4.0–10.5)
nRBC: 0 % (ref 0.0–0.2)

## 2020-03-07 LAB — URINALYSIS, COMPLETE (UACMP) WITH MICROSCOPIC
Bilirubin Urine: NEGATIVE
Glucose, UA: NEGATIVE mg/dL
Hgb urine dipstick: NEGATIVE
Ketones, ur: NEGATIVE mg/dL
Nitrite: NEGATIVE
Protein, ur: NEGATIVE mg/dL
Specific Gravity, Urine: 1.03 (ref 1.005–1.030)
pH: 5 (ref 5.0–8.0)

## 2020-03-07 LAB — BASIC METABOLIC PANEL
Anion gap: 7 (ref 5–15)
BUN: 11 mg/dL (ref 6–20)
CO2: 26 mmol/L (ref 22–32)
Calcium: 9.1 mg/dL (ref 8.9–10.3)
Chloride: 104 mmol/L (ref 98–111)
Creatinine, Ser: 0.7 mg/dL (ref 0.44–1.00)
GFR calc Af Amer: >60 mL/min (ref >60)
GFR calc non Af Amer: >60 mL/min (ref >60)
Glucose, Bld: 87 mg/dL (ref 70–99)
Potassium: 3.4 mmol/L — ABNORMAL LOW (ref 3.5–5.1)
Sodium: 137 mmol/L (ref 135–145)

## 2020-03-07 LAB — GLUCOSE, CAPILLARY: Glucose-Capillary: 74 mg/dL (ref 70–99)

## 2020-03-07 LAB — POCT PREGNANCY, URINE: Preg Test, Ur: NEGATIVE

## 2020-03-07 LAB — TROPONIN I (HIGH SENSITIVITY): Troponin I (High Sensitivity): <2 ng/L (ref <18)

## 2020-03-07 MED ORDER — ONDANSETRON 4 MG PO TBDP: ORAL_TABLET | ORAL | 0 refills | Status: DC

## 2020-03-07 NOTE — ED Notes (Signed)
See triage note, pt reports started feeling weak over the weekend. Reports today started having loose stool as well as nausea and lower abdominal pain. Denies fevers.  Reports was driving tonight and started having dizziness with some distorted vision. Denies dizziness at this time. Denies CP and SHOB

## 2020-03-07 NOTE — ED Notes (Addendum)
Stretcher locked in lowest position, call bell within reach

## 2020-03-07 NOTE — Discharge Instructions (Signed)

## 2020-03-07 NOTE — ED Triage Notes (Signed)
Pt c/o general fatigue since Saturday, today has had multiple episodes of loose stool. Pt to ED due to dizziness while pumping gas. Pt denies chest pain and SOB.

## 2020-03-07 NOTE — ED Provider Notes (Signed)
[facility] Emergency Department Provider Note  ____________________________________________   First MD Initiated Contact with Patient 03/07/20 2304     (approximate)  I have reviewed the triage vital signs and the nursing notes.   HISTORY  Chief Complaint Dizziness and Abdominal Pain    HPI Cathy Webster is a 29 y.o. female with medical history as listed below who presents by EMS for evaluation of dizziness and diarrhea.   She states that for the last 2 days she has had some stomach upset with nausea and about 6 episodes of loose stool per day.  She denies abdominal pain except for some very mild cramping.  She has felt some muscle soreness in the upper part of bilateral shoulders and she has felt very tired for the last couple of days.  Tonight the symptoms became acutely more severe when she was driving to the gas station and she felt a sudden onset of dizziness.  She did not pass out.  She says that she has passed out before and did not want that to happen again so she came to make sure nothing bad was wrong with her.  She feels better now.  She has no symptoms at the moment.  She denies recent fever, sore throat, chest pain, shortness of breath, cough, vomiting, abdominal pain, and dysuria.  Nothing in particular made the symptoms better or worse.  She is able to tolerate oral intake including Gatorade Zero.  She is able to drink fluids well in the emergency department.        Past Medical History:  Diagnosis Date  . Abnormal Pap smear 2008  . COVID-19 10/20/2019   Diagnosed on 10/20/2019  . Cystitis 04/09/16  . Hives   . Hypertension   . Preeclampsia   . UTI (lower urinary tract infection)     Patient Active Problem List   Diagnosis Date Noted  . IUD check up 09/15/2019  . Panic attack 10/09/2018  . Contraceptive management 04/09/2016  . Preeclampsia in postpartum period 03/01/2016  . Abnormal Pap smear of cervix 12/22/2013    Past  Surgical History:  Procedure Laterality Date  . CESAREAN SECTION    . LAPAROSCOPY N/A 09/26/2018   Procedure: LAPAROSCOPY OPERATIVE WITH LEFT SALPINGECTOMY;  Surgeon: Donnamae Jude, MD;  Location: [facility];  Service: Gynecology;  Laterality: N/A;  . TONSILLECTOMY      Prior to Admission medications   Medication Sig Start Date End Date Taking? Authorizing Provider  hydrOXYzine (ATARAX/VISTARIL) 50 MG tablet Take 1 tablet (50 mg total) by mouth 3 (three) times daily as needed for itching. 10/22/19   Sable Feil, PA-C  methylPREDNISolone (MEDROL DOSEPAK) 4 MG TBPK tablet Take Tapered dose as directed.  First dose in the morning. 10/22/19   Sable Feil, PA-C  ondansetron (ZOFRAN ODT) 4 MG disintegrating tablet Allow 1-2 tablets to dissolve in your mouth every 8 hours as needed for nausea/vomiting 03/07/20   Hinda Kehr, MD  fluticasone [facility]) 50 MCG/ACT nasal spray Place 2 sprays into both nostrils daily. 08/19/19 10/22/19  Menshew, Dannielle Karvonen, PA-C    Allergies Patient has no known allergies.  Family History  Problem Relation Age of Onset  . Diabetes Mother   . Hypertension Mother   . Asthma Brother     Social History Social History   Tobacco Use  . Smoking status: Never Smoker  . Smokeless tobacco: Never Used  Substance Use Topics  . Alcohol use: No  . Drug  use: No    Review of Systems Constitutional: No fever/chills Eyes: No visual changes. ENT: No sore throat. Cardiovascular: Denies chest pain. Respiratory: Denies shortness of breath. Gastrointestinal: Multiple episodes of loose stools (diarrhea) and nausea for about 2 to 3 days.  No abdominal pain.  No vomiting.  No constipation. Genitourinary: Negative for dysuria. Musculoskeletal: Negative for neck pain.  Negative for back pain. Integumentary: Negative for rash. Neurological: Acute onset dizziness, now resolved.  Negative for headaches, focal weakness or  numbness.   ____________________________________________   PHYSICAL EXAM:  VITAL SIGNS: ED Triage Vitals [03/07/20 1956]  Enc Vitals Group     BP (!) 143/79     Pulse Rate 95     Resp 17     Temp 97.8 F (36.6 C)     Temp Source Oral     SpO2 99 %     Weight      Height      Head Circumference      Peak Flow      Pain Score      Pain Loc      Pain Edu?      Excl. in GC?     Constitutional: Alert and oriented.  Well-appearing and in no distress. Eyes: Conjunctivae are normal.  Pupils are equal and reactive bilaterally. Head: Atraumatic. Nose: No congestion/rhinnorhea. Mouth/Throat: Patient is wearing a mask. Neck: No stridor.  No meningeal signs.   Cardiovascular: Normal rate, regular rhythm. Good peripheral circulation. Grossly normal heart sounds. Respiratory: Normal respiratory effort.  No retractions. Gastrointestinal: Soft and nontender. No distention.  Musculoskeletal: No lower extremity tenderness nor edema. No gross deformities of extremities. Neurologic:  Normal speech and language. No gross focal neurologic deficits are appreciated.  Skin:  Skin is warm, dry and intact. Psychiatric: Mood and affect are normal. Speech and behavior are normal.  ____________________________________________   LABS (all labs ordered are listed, but only abnormal results are displayed)  Labs Reviewed  BASIC METABOLIC PANEL - Abnormal; Notable for the following components:      Result Value   Potassium 3.4 (*)    All other components within normal limits  URINALYSIS, COMPLETE (UACMP) WITH MICROSCOPIC - Abnormal; Notable for the following components:   Color, Urine YELLOW (*)    APPearance CLOUDY (*)    Leukocytes,Ua TRACE (*)    Bacteria, UA RARE (*)    All other components within normal limits  URINE CULTURE  CBC  GLUCOSE, CAPILLARY  CBG MONITORING, ED  POC URINE PREG, ED  POCT PREGNANCY, URINE  TROPONIN I (HIGH SENSITIVITY)    ____________________________________________  EKG  ED ECG REPORT I, Loleta Rose, the attending physician, personally viewed and interpreted this ECG.  Date: 03/07/2020 EKG Time: 19: 55 Rate: 83 Rhythm: normal sinus rhythm with sinus arrhythmia QRS Axis: normal Intervals: normal ST/T Wave abnormalities: normal Narrative Interpretation: no evidence of acute ischemia  ____________________________________________  RADIOLOGY I, Loleta Rose, personally viewed and evaluated these images (plain radiographs) as part of my medical decision making, as well as reviewing the written report by the radiologist.  ED MD interpretation: No indication for emergent imaging  Official radiology report(s): No results found.  ____________________________________________   PROCEDURES   Procedure(s) performed (including Critical Care):  .1-3 Lead EKG Interpretation Performed by: Loleta Rose, MD Authorized by: Loleta Rose, MD     Interpretation: normal     ECG rate:  75   ECG rate assessment: normal     Rhythm: sinus rhythm  Ectopy: none     Conduction: normal       ____________________________________________   INITIAL IMPRESSION / MDM / ASSESSMENT AND PLAN / ED COURSE  As part of my medical decision making, I reviewed the following data within the electronic MEDICAL RECORD NUMBER Nursing notes reviewed and incorporated, Labs reviewed , EKG interpreted , Old chart reviewed and reviewed Notes from prior ED visits   Differential diagnosis includes, but is not limited to, volume depletion due to diarrhea, viral GI illness, bacterial infection, electrolyte or metabolic abnormality, much less likely CVA or other acute neurological emergency.  Patient is well-appearing and in no distress.  She has been in the emergency department nearly 4 hours and has had no additional symptoms.  Lab work including CBC, metabolic panel, high-sensitivity troponin, urinalysis are all reassuring.   There is some trace leukocytes in her urinalysis but she is otherwise asymptomatic and I do not feel that this meets criteria for treatment.  I have ordered a urine culture but given that she had more squamous epithelials than white blood cells I think this is most likely a contaminated specimen and she would not benefit from empiric antibiotics.  The patient was on the cardiac monitor to evaluate for evidence of arrhythmia and/or significant heart rate changes.  The patient feels well and is reassured by the work-up.  No focal neurological deficits.  No indication for further evaluation or work-up.  She is tolerating good oral intake.  I gave my usual and customary management recommendations and return precautions and she understands and agrees with the plan.         ____________________________________________  FINAL CLINICAL IMPRESSION(S) / ED DIAGNOSES  Final diagnoses:  Dizziness  Diarrhea, unspecified type     MEDICATIONS GIVEN DURING THIS VISIT:  Medications - No data to display   ED Discharge Orders         Ordered    ondansetron (ZOFRAN ODT) 4 MG disintegrating tablet     03/07/20 2331          *Please note:  Krystine Pabst was evaluated in Emergency Department on 03/07/2020 for the symptoms described in the history of present illness. She was evaluated in the context of the global COVID-19 pandemic, which necessitated consideration that the patient might be at risk for infection with the SARS-CoV-2 virus that causes COVID-19. Institutional protocols and algorithms that pertain to the evaluation of patients at risk for COVID-19 are in a state of rapid change based on information released by regulatory bodies including the CDC and federal and state organizations. These policies and algorithms were followed during the patient's care in the ED.  Some ED evaluations and interventions may be delayed as a result of limited staffing during the pandemic.*  Note:  This document  was prepared using Dragon voice recognition software and may include unintentional dictation errors.   Loleta Rose, MD 03/07/20 [phone]

## 2020-03-09 LAB — URINE CULTURE

## 2020-03-15 NOTE — Progress Notes (Addendum)
Patient ID: Cathy Webster, female   DOB: [birth year], 29 y.o.   MRN: [id number] Patient was assessed and managed by nursing staff during this encounter. I have reviewed the chart and agree with the documentation and plan. I have also made any necessary editorial changes.   Scheryl Darter, MD 03/15/2020 7:33 PM

## 2020-08-09 ENCOUNTER — Ambulatory Visit
Admission: EM | Admit: 2020-08-09 | Discharge: 2020-08-09 | Disposition: A | Discharge status: Home / Self Care | Payer: BC Managed Care – PPO | Attending: Emergency Medicine | Admitting: Emergency Medicine

## 2020-08-09 ENCOUNTER — Encounter: Payer: Self-pay | Admitting: Emergency Medicine

## 2020-08-09 DIAGNOSIS — M791 Myalgia, unspecified site: Secondary | ICD-10-CM | POA: Diagnosis not present

## 2020-08-09 DIAGNOSIS — Z1152 Encounter for screening for COVID-19: Secondary | ICD-10-CM

## 2020-08-09 DIAGNOSIS — R1013 Epigastric pain: Secondary | ICD-10-CM | POA: Diagnosis not present

## 2020-08-09 DIAGNOSIS — R197 Diarrhea, unspecified: Secondary | ICD-10-CM

## 2020-08-09 LAB — POCT URINE PREGNANCY: Preg Test, Ur: NEGATIVE

## 2020-08-09 LAB — POCT URINALYSIS DIP (MANUAL ENTRY)
Bilirubin, UA: NEGATIVE
Blood, UA: NEGATIVE
Glucose, UA: NEGATIVE mg/dL
Ketones, POC UA: NEGATIVE mg/dL
Leukocytes, UA: NEGATIVE
Nitrite, UA: NEGATIVE
Protein Ur, POC: NEGATIVE mg/dL
Spec Grav, UA: 1.02 (ref 1.010–1.025)
Urobilinogen, UA: 1 E.U./dL
pH, UA: 8.5 — AB (ref 5.0–8.0)

## 2020-08-09 MED ORDER — FAMOTIDINE 40 MG PO TABS
40.0000 mg | ORAL_TABLET | Freq: Every day | ORAL | 0 refills | Status: DC
Start: 2020-08-09 — End: 2021-01-03

## 2020-08-09 MED ORDER — ALUM & MAG HYDROXIDE-SIMETH 200-200-20 MG/5ML PO SUSP
30.0000 mL | Freq: Once | ORAL | Status: AC
  Administered 2020-08-09: 30 mL via ORAL

## 2020-08-09 MED ORDER — ONDANSETRON 4 MG PO TBDP
4.0000 mg | ORAL_TABLET | Freq: Three times a day (TID) | ORAL | 0 refills | Status: DC | PRN
Start: 2020-08-09 — End: 2021-01-03

## 2020-08-09 MED ORDER — LIDOCAINE VISCOUS HCL 2 % MT SOLN
15.0000 mL | Freq: Once | OROMUCOSAL | Status: AC
  Administered 2020-08-09: 15 mL via ORAL

## 2020-08-09 MED ORDER — SUCRALFATE 1 G PO TABS: 1.0000 g | ORAL_TABLET | Freq: Three times a day (TID) | ORAL | 0 refills | Status: DC

## 2020-08-09 NOTE — ED Provider Notes (Signed)
[facility]    CSN: [id number] Arrival date & time: 08/09/20  1932      History   Chief Complaint Chief Complaint  Patient presents with  . Abdominal Pain  . Diarrhea  . Joint Pain    HPI Cathy Webster is a 29 y.o. female  Cathy Webster is a 29 y.o. female who presents for evaluation of abdominal pain. The pain is described as aching, burning and shooting. Pain is located in the epigastric without radiation. Onset was a few days ago, worse today. Symptoms have been gradually worsening since. Aggravating factors: none.  Alleviating factors: acetaminophen. Associated symptoms: arthralgias and diarrhea. The patient denies anorexia, belching, dysuria, fever, melena and vomiting.  Denies history of gastritis, GERD, pancreatitis, alcohol use.  No change in diet, lifestyle, medications.  No known sick contacts.  Requesting GI cocktail as this is helped in the past. The following portions of the patient's history were reviewed and updated as appropriate: allergies, current medications, past family history, past medical history, past social history, past surgical history and problem list.     Past Medical History:  Diagnosis Date  . Abnormal Pap smear 2008  . COVID-19 10/20/2019   Diagnosed on 10/20/2019  . Cystitis 04/09/16  . Hives   . Hypertension   . Preeclampsia   . UTI (lower urinary tract infection)     Patient Active Problem List   Diagnosis Date Noted  . IUD check up 09/15/2019  . Panic attack 10/09/2018  . Contraceptive management 04/09/2016  . Preeclampsia in postpartum period 03/01/2016  . Abnormal Pap smear of cervix 12/22/2013    Past Surgical History:  Procedure Laterality Date  . CESAREAN SECTION    . LAPAROSCOPY N/A 09/26/2018   Procedure: LAPAROSCOPY OPERATIVE WITH LEFT SALPINGECTOMY;  Surgeon: Reva Bores, MD;  Location: [facility];  Service: Gynecology;  Laterality: N/A;  . TONSILLECTOMY      OB History    Gravida  5   Para  4     Term  4   Preterm      AB  1   Living  4     SAB      TAB      Ectopic  1   Multiple  0   Live Births  4            Home Medications    Prior to Admission medications   Medication Sig Start Date End Date Taking? Authorizing Provider  famotidine (PEPCID) 40 MG tablet Take 1 tablet (40 mg total) by mouth daily. 08/09/20   Hall-Potvin, [place], PA-C  hydrOXYzine (ATARAX/VISTARIL) 50 MG tablet Take 1 tablet (50 mg total) by mouth 3 (three) times daily as needed for itching. 10/22/19   Joni Reining, PA-C  methylPREDNISolone (MEDROL DOSEPAK) 4 MG TBPK tablet Take Tapered dose as directed.  First dose in the morning. 10/22/19   Joni Reining, PA-C  ondansetron (ZOFRAN ODT) 4 MG disintegrating tablet Take 1 tablet (4 mg total) by mouth every 8 (eight) hours as needed for nausea or vomiting. 08/09/20   Hall-Potvin, [place], PA-C  sucralfate (CARAFATE) 1 g tablet Take 1 tablet (1 g total) by mouth 4 (four) times daily -  with meals and at bedtime for 7 days. 08/09/20 08/16/20  Hall-Potvin, [place], PA-C  fluticasone (FLONASE) 50 MCG/ACT nasal spray Place 2 sprays into both nostrils daily. 08/19/19 10/22/19  Menshew, Charlesetta Ivory, PA-C    Family History Family History  Problem Relation  Age of Onset  . Diabetes Mother   . Hypertension Mother   . Asthma Brother     Social History Social History   Tobacco Use  . Smoking status: Never Smoker  . Smokeless tobacco: Never Used  Vaping Use  . Vaping Use: Never used  Substance Use Topics  . Alcohol use: No  . Drug use: No     Allergies   Patient has no known allergies.   Review of Systems Review of Systems  Constitutional: Negative for fatigue and fever.  HENT: Negative for ear pain, sinus pain, sore throat and voice change.   Eyes: Negative for pain, redness and visual disturbance.  Respiratory: Negative for cough and shortness of breath.   Cardiovascular: Negative for chest pain and palpitations.   Gastrointestinal: Positive for abdominal pain and diarrhea. Negative for abdominal distention, blood in stool and vomiting.  Musculoskeletal: Negative for arthralgias and myalgias.  Skin: Negative for rash and wound.  Neurological: Negative for syncope and headaches.     Physical Exam Triage Vital Signs ED Triage Vitals  Enc Vitals Group     BP 08/09/20 1950 (!) 155/89     Pulse Rate 08/09/20 1950 96     Resp 08/09/20 1950 19     Temp 08/09/20 1950 98.4 F (36.9 C)     Temp Source 08/09/20 1950 Oral     SpO2 08/09/20 1950 98 %     Weight --      Height --      Head Circumference --      Peak Flow --      Pain Score 08/09/20 1948 7     Pain Loc --      Pain Edu? --      Excl. in GC? --    No data found.  Updated Vital Signs BP (!) 155/89 (BP Location: Left Wrist)   Pulse 96   Temp 98.4 F (36.9 C) (Oral)   Resp 19   SpO2 98%   Visual Acuity Right Eye Distance:   Left Eye Distance:   Bilateral Distance:    Right Eye Near:   Left Eye Near:    Bilateral Near:     Physical Exam Constitutional:      General: She is not in acute distress. HENT:     Head: Normocephalic and atraumatic.  Eyes:     General: No scleral icterus.    Pupils: Pupils are equal, round, and reactive to light.  Cardiovascular:     Rate and Rhythm: Normal rate.  Pulmonary:     Effort: Pulmonary effort is normal.  Abdominal:     General: Bowel sounds are normal. There is no distension or abdominal bruit.     Palpations: Abdomen is soft. There is no hepatomegaly, splenomegaly or pulsatile mass.     Tenderness: There is abdominal tenderness in the epigastric area. There is no right CVA tenderness, left CVA tenderness or rebound. Negative signs include Rovsing's sign and McBurney's sign.     Hernia: No hernia is present.  Skin:    Coloration: Skin is not jaundiced or pale.  Neurological:     Mental Status: She is alert and oriented to person, place, and time.      UC Treatments /  Results  Labs (all labs ordered are listed, but only abnormal results are displayed) Labs Reviewed  POCT URINALYSIS DIP (MANUAL ENTRY) - Abnormal; Notable for the following components:      Result Value   pH, UA  8.5 (*)    All other components within normal limits  NOVEL CORONAVIRUS, NAA  POCT URINE PREGNANCY    EKG   Radiology No results found.  Procedures Procedures (including critical care time)  Medications Ordered in UC Medications  alum & mag hydroxide-simeth (MAALOX/MYLANTA) 200-200-20 MG/5ML suspension 30 mL (has no administration in time range)    And  lidocaine (XYLOCAINE) 2 % viscous mouth solution 15 mL (has no administration in time range)    Initial Impression / Assessment and Plan / UC Course  I have reviewed the triage vital signs and the nursing notes.  Pertinent labs & imaging results that were available during my care of the patient were reviewed by me and considered in my medical decision making (see chart for details).     Patient febrile, nontoxic in office today.  Patient does have significant epigastric pain without significant history.  Has taken GI cocktail before, is requesting today.  Urine unremarkable, Covid pending at patient's request.  Urine pregnancy negative.  Patient tolerates GI cocktail well with some improvement in symptoms.  Will treat for gastritis as below.  ER return precautions discussed, pt verbalized understanding and is agreeable to plan. Final Clinical Impressions(s) / UC Diagnoses   Final diagnoses:  Encounter for screening for COVID-19  Epigastric pain  Diarrhea, unspecified type  Myalgia     Discharge Instructions     Your COVID test is pending - it is important to quarantine / isolate at home until your results are back. If you test positive and would like further evaluation for persistent or worsening symptoms, you may schedule an E-visit or virtual (video) visit throughout the [facility] app or  website.  PLEASE NOTE: If you develop severe chest pain or shortness of breath please go to the ER or call [phone] for further evaluation --> DO NOT schedule electronic or virtual visits for this. Please call our office for further guidance / recommendations as needed.  For information about the Covid vaccine, please visit [email or web address]    ED Prescriptions    Medication Sig Dispense Auth. Provider   sucralfate (CARAFATE) 1 g tablet Take 1 tablet (1 g total) by mouth 4 (four) times daily -  with meals and at bedtime for 7 days. 28 tablet Hall-Potvin, [place], PA-C   ondansetron (ZOFRAN ODT) 4 MG disintegrating tablet Take 1 tablet (4 mg total) by mouth every 8 (eight) hours as needed for nausea or vomiting. 21 tablet Hall-Potvin, [place], PA-C   famotidine (PEPCID) 40 MG tablet Take 1 tablet (40 mg total) by mouth daily. 30 tablet Hall-Potvin, [place], PA-C     PDMP not reviewed this encounter.   Hall-Potvin, [place], [place] 08/09/20 2037

## 2020-08-09 NOTE — ED Triage Notes (Signed)
Pt presents with severe abdominal pain, diarrhea, and joint pain/aches. Last does of tylenol at 1700.

## 2020-08-09 NOTE — Discharge Instructions (Signed)
Your COVID test is pending - it is important to quarantine / isolate at home until your results are back. °If you test positive and would like further evaluation for persistent or worsening symptoms, you may schedule an E-visit or virtual (video) visit throughout the [facility] My[facility] app or website. ° °PLEASE NOTE: If you develop severe chest pain or shortness of breath please go to the ER or call 9-1-1 for further evaluation --> DO NOT schedule electronic or virtual visits for this. °Please call our office for further guidance / recommendations as needed. ° °For information about the Covid vaccine, please visit [email or web address] °

## 2020-08-10 ENCOUNTER — Other Ambulatory Visit: Payer: Self-pay

## 2020-08-10 ENCOUNTER — Emergency Department: Payer: BC Managed Care – PPO

## 2020-08-10 ENCOUNTER — Emergency Department
Admission: EM | Admit: 2020-08-10 | Discharge: 2020-08-11 | Disposition: A | Discharge status: Home / Self Care | Payer: BC Managed Care – PPO | Attending: Emergency Medicine | Admitting: Emergency Medicine

## 2020-08-10 DIAGNOSIS — Z8616 Personal history of COVID-19: Secondary | ICD-10-CM | POA: Insufficient documentation

## 2020-08-10 DIAGNOSIS — I10 Essential (primary) hypertension: Secondary | ICD-10-CM | POA: Insufficient documentation

## 2020-08-10 DIAGNOSIS — R109 Unspecified abdominal pain: Secondary | ICD-10-CM | POA: Diagnosis present

## 2020-08-10 DIAGNOSIS — K5900 Constipation, unspecified: Secondary | ICD-10-CM | POA: Diagnosis not present

## 2020-08-10 LAB — CBC
HCT: 36 % (ref 36.0–46.0)
Hemoglobin: 11.8 g/dL — ABNORMAL LOW (ref 12.0–15.0)
MCH: 28.2 pg (ref 26.0–34.0)
MCHC: 32.8 g/dL (ref 30.0–36.0)
MCV: 85.9 fL (ref 80.0–100.0)
Platelets: 200 10*3/uL (ref 150–400)
RBC: 4.19 MIL/uL (ref 3.87–5.11)
RDW: 12.8 % (ref 11.5–15.5)
WBC: 5 10*3/uL (ref 4.0–10.5)
nRBC: 0 % (ref 0.0–0.2)

## 2020-08-10 LAB — COMPREHENSIVE METABOLIC PANEL
ALT: 15 U/L (ref 0–44)
AST: 18 U/L (ref 15–41)
Albumin: 3.7 g/dL (ref 3.5–5.0)
Alkaline Phosphatase: 50 U/L (ref 38–126)
Anion gap: 8 (ref 5–15)
BUN: 7 mg/dL (ref 6–20)
CO2: 25 mmol/L (ref 22–32)
Calcium: 8.5 mg/dL — ABNORMAL LOW (ref 8.9–10.3)
Chloride: 108 mmol/L (ref 98–111)
Creatinine, Ser: 0.74 mg/dL (ref 0.44–1.00)
GFR, Estimated: >60 mL/min (ref >60)
Glucose, Bld: 104 mg/dL — ABNORMAL HIGH (ref 70–99)
Potassium: 3.9 mmol/L (ref 3.5–5.1)
Sodium: 141 mmol/L (ref 135–145)
Total Bilirubin: 0.5 mg/dL (ref 0.3–1.2)
Total Protein: 7.3 g/dL (ref 6.5–8.1)

## 2020-08-10 LAB — LIPASE, BLOOD: Lipase: 25 U/L (ref 11–51)

## 2020-08-10 LAB — SARS-COV-2, NAA 2 DAY TAT

## 2020-08-10 LAB — NOVEL CORONAVIRUS, NAA: SARS-CoV-2, NAA: NOT DETECTED

## 2020-08-10 LAB — POC URINE PREG, ED: Preg Test, Ur: NEGATIVE

## 2020-08-10 MED ORDER — KETOROLAC TROMETHAMINE 30 MG/ML IJ SOLN
30.0000 mg | Freq: Once | INTRAMUSCULAR | Status: AC
  Administered 2020-08-10: 30 mg via INTRAVENOUS
  Filled 2020-08-10: qty 1

## 2020-08-10 MED ORDER — ONDANSETRON HCL 4 MG/2ML IJ SOLN
4.0000 mg | Freq: Once | INTRAMUSCULAR | Status: AC
  Administered 2020-08-10: 4 mg via INTRAVENOUS
  Filled 2020-08-10: qty 2

## 2020-08-10 MED ORDER — IOHEXOL 300 MG/ML  SOLN
100.0000 mL | Freq: Once | INTRAMUSCULAR | Status: AC | PRN
  Administered 2020-08-10: 100 mL via INTRAVENOUS

## 2020-08-10 MED ORDER — SODIUM CHLORIDE 0.9 % IV BOLUS
1000.0000 mL | Freq: Once | INTRAVENOUS | Status: AC
  Administered 2020-08-10: 1000 mL via INTRAVENOUS

## 2020-08-10 NOTE — ED Notes (Signed)
Pt endorsing diarrhea since Monday with mid upper constant abdominal pain. Pt endorses fever and chills yesterday. Pt took meds from urgent care for nausea and diarrhea with no relief.

## 2020-08-10 NOTE — ED Provider Notes (Signed)
[facility] Emergency Department Provider Note  ____________________________________________   First MD Initiated Contact with Patient 08/10/20 2300     (approximate)  I have reviewed the triage vital signs and the nursing notes.   HISTORY  Chief Complaint Abdominal Pain   HPI Cathy Webster is a 29 y.o. female with below list of previous medical conditions presents to the emergency department secondary to 3-day history of central abdominal pain that is currently 10 out of 10 with associated nonbloody diarrhea.  Patient also admits to nausea.  Patient also admits to fever.  Patient denies any recent antibiotic use.  Patient states that diarrhea resolved today however abdominal pain is persisted.  Patient was seen at urgent care secondary to the same and prescribed Carafate Zofran and Pepcid which patient states has not improved her discomfort.        Past Medical History:  Diagnosis Date  . Abnormal Pap smear 2008  . COVID-19 10/20/2019   Diagnosed on 10/20/2019  . Cystitis 04/09/16  . Hives   . Hypertension   . Preeclampsia   . UTI (lower urinary tract infection)     Patient Active Problem List   Diagnosis Date Noted  . IUD check up 09/15/2019  . Panic attack 10/09/2018  . Contraceptive management 04/09/2016  . Preeclampsia in postpartum period 03/01/2016  . Abnormal Pap smear of cervix 12/22/2013    Past Surgical History:  Procedure Laterality Date  . CESAREAN SECTION    . LAPAROSCOPY N/A 09/26/2018   Procedure: LAPAROSCOPY OPERATIVE WITH LEFT SALPINGECTOMY;  Surgeon: Reva Bores, MD;  Location: [facility];  Service: Gynecology;  Laterality: N/A;  . TONSILLECTOMY      Prior to Admission medications   Medication Sig Start Date End Date Taking? Authorizing Provider  famotidine (PEPCID) 40 MG tablet Take 1 tablet (40 mg total) by mouth daily. 08/09/20   Hall-Potvin, [place], PA-C  hydrOXYzine (ATARAX/VISTARIL) 50 MG tablet Take 1 tablet  (50 mg total) by mouth 3 (three) times daily as needed for itching. 10/22/19   Joni Reining, PA-C  methylPREDNISolone (MEDROL DOSEPAK) 4 MG TBPK tablet Take Tapered dose as directed.  First dose in the morning. 10/22/19   Joni Reining, PA-C  ondansetron (ZOFRAN ODT) 4 MG disintegrating tablet Take 1 tablet (4 mg total) by mouth every 8 (eight) hours as needed for nausea or vomiting. 08/09/20   Hall-Potvin, [place], PA-C  sucralfate (CARAFATE) 1 g tablet Take 1 tablet (1 g total) by mouth 4 (four) times daily -  with meals and at bedtime for 7 days. 08/09/20 08/16/20  Hall-Potvin, [place], PA-C  fluticasone (FLONASE) 50 MCG/ACT nasal spray Place 2 sprays into both nostrils daily. 08/19/19 10/22/19  Menshew, Charlesetta Ivory, PA-C    Allergies Patient has no known allergies.  Family History  Problem Relation Age of Onset  . Diabetes Mother   . Hypertension Mother   . Asthma Brother     Social History Social History   Tobacco Use  . Smoking status: Never Smoker  . Smokeless tobacco: Never Used  Vaping Use  . Vaping Use: Never used  Substance Use Topics  . Alcohol use: No  . Drug use: No    Review of Systems Constitutional: Positive for fever/chills Eyes: No visual changes. ENT: No sore throat. Cardiovascular: Denies chest pain. Respiratory: Denies shortness of breath. Gastrointestinal: Positive for abdominal pain, nausea Genitourinary: Negative for dysuria. Musculoskeletal: Negative for neck pain.  Negative for back pain. Integumentary: Negative for  rash. Neurological: Negative for headaches, focal weakness or numbness.   ____________________________________________   PHYSICAL EXAM:  VITAL SIGNS: ED Triage Vitals [08/10/20 1830]  Enc Vitals Group     BP 137/70     Pulse Rate 90     Resp 20     Temp 98.8 F (37.1 C)     Temp Source Oral     SpO2 99 %     Weight 127 kg (280 lb)     Height 1.676 m (5\' 6" )     Head Circumference      Peak Flow      Pain Score 10      Pain Loc      Pain Edu?      Excl. in GC?     Constitutional: Alert and oriented.  Eyes: Conjunctivae are normal.  Head: Atraumatic. Mouth/Throat: Patient is wearing a mask. Neck: No stridor.  No meningeal signs.   Cardiovascular: Normal rate, regular rhythm. Good peripheral circulation. Grossly normal heart sounds. Respiratory: Normal respiratory effort.  No retractions. Gastrointestinal: Soft and nontender. No distention.  Musculoskeletal: No lower extremity tenderness nor edema. No gross deformities of extremities. Neurologic:  Normal speech and language. No gross focal neurologic deficits are appreciated.  Skin:  Skin is warm, dry and intact. Psychiatric: Mood and affect are normal. Speech and behavior are normal.  ____________________________________________   LABS (all labs ordered are listed, but only abnormal results are displayed)  Labs Reviewed  COMPREHENSIVE METABOLIC PANEL - Abnormal; Notable for the following components:      Result Value   Glucose, Bld 104 (*)    Calcium 8.5 (*)    All other components within normal limits  CBC - Abnormal; Notable for the following components:   Hemoglobin 11.8 (*)    All other components within normal limits  LIPASE, BLOOD  URINALYSIS, COMPLETE (UACMP) WITH MICROSCOPIC  POC URINE PREG, ED     RADIOLOGY I, [place] Zahriyah Joo, personally viewed and evaluated these images (plain radiographs) as part of my medical decision making, as well as reviewing the written report by the radiologist.  ED MD interpretation: Moderate colonic stool burden with equalization into the small bowel.  Official radiology report(s): CT ABDOMEN PELVIS W CONTRAST  Result Date: 08/10/2020 CLINICAL DATA:  Acute abdominal pain.  Diarrhea.  Fever and chills. EXAM: CT ABDOMEN AND PELVIS WITH CONTRAST TECHNIQUE: Multidetector CT imaging of the abdomen and pelvis was performed using the standard protocol following bolus administration of intravenous  contrast. CONTRAST:  13/12/2019 OMNIPAQUE IOHEXOL 300 MG/ML  SOLN COMPARISON:  None. FINDINGS: Lower chest: Small right pleural effusion. No focal airspace disease. Hepatobiliary: No focal hepatic lesion. Decompressed gallbladder. No calcified gallstone. No biliary dilatation. Pancreas: No ductal dilatation or inflammation. Spleen: Normal in size without focal abnormality. Adrenals/Urinary Tract: Normal adrenal glands. No hydronephrosis or perinephric edema. Homogeneous renal enhancement. Urinary bladder is partially distended without wall thickening. Stomach/Bowel: Tiny hiatal hernia. Ingested material within the stomach. There is no gastric wall thickening. Normal positioning of the duodenum and ligament of Treitz. Occasional fecalization of small bowel contents. No small bowel obstruction or inflammation. No terminal ileal inflammation. The appendix is normal. There is formed stool throughout the colon, moderate stool burden in the right colon. There is no liquid stool. No colonic wall thickening or pericolonic edema. Vascular/Lymphatic: Normal caliber abdominal aorta. Patent portal vein. Increased number of central mesenteric lymph nodes, not enlarged by size criteria. Reproductive: IUD appropriately position in the uterus. Symmetric sized ovaries.  No adnexal mass. Other: Small amount of free fluid in the dependent pelvis may be reactive or physiologic. There is no upper abdominal ascites. No mesenteric inflammation. No free air. No abscess. Small fat containing umbilical and supraumbilical ventral abdominal wall hernia. Musculoskeletal: Sclerosis about both sacroiliac joints, left greater than right. No acute osseous abnormalities. IMPRESSION: 1. Increased number of central mesenteric lymph nodes, not enlarged by size criteria, likely possibility of mesenteric adenitis is also considered. 2. Moderate colonic stool burden with fecalization of small bowel contents, typical of constipation. There is no liquid stool in  the colon to suggest diarrheal process. If patient is clinically having diarrhea, this may be due to overflow. 3. Small right pleural effusion. 4. Small amount of free fluid in the dependent pelvis may be reactive or physiologic. 5. Small fat containing umbilical and supraumbilical ventral abdominal wall hernias. 6. Bilateral sacroiliitis, left greater than right. Electronically Signed   By: Narda Rutherford M.D.   On: 08/10/2020 23:49      Procedures   ____________________________________________   INITIAL IMPRESSION / MDM / ASSESSMENT AND PLAN / ED COURSE  As part of my medical decision making, I reviewed the following data within the electronic MEDICAL RECORD NUMBER   29 year old female presented with above-stated history and physical exam a differential diagnosis including but not limited to infectious diarrhea, colitis, gastroenteritis, appendicitis.  CT scan revealed moderate colonic stool with fecalization of small bowel.  As such possibility of overflow been a source of the patient's previous diarrhea which is since resolved.  Patient was given MiraLAX in the emergency department.  ____________________________________________  FINAL CLINICAL IMPRESSION(S) / ED DIAGNOSES  Final diagnoses:  Constipation, unspecified constipation type     MEDICATIONS GIVEN DURING THIS VISIT:  Medications  polyethylene glycol (MIRALAX / GLYCOLAX) packet 17 g (has no administration in time range)  sodium chloride 0.9 % bolus 1,000 mL (1,000 mLs Intravenous New Bag/Given 08/10/20 2321)  ketorolac (TORADOL) 30 MG/ML injection 30 mg (30 mg Intravenous Given 08/10/20 2322)  ondansetron (ZOFRAN) injection 4 mg (4 mg Intravenous Given 08/10/20 2322)  iohexol (OMNIPAQUE) 300 MG/ML solution 100 mL (100 mLs Intravenous Contrast Given 08/10/20 2332)     ED Discharge Orders    None      *Please note:  Murlean Seelye was evaluated in Emergency Department on 08/11/2020 for the symptoms described in the  history of present illness. She was evaluated in the context of the global COVID-19 pandemic, which necessitated consideration that the patient might be at risk for infection with the SARS-CoV-2 virus that causes COVID-19. Institutional protocols and algorithms that pertain to the evaluation of patients at risk for COVID-19 are in a state of rapid change based on information released by regulatory bodies including the CDC and federal and state organizations. These policies and algorithms were followed during the patient's care in the ED.  Some ED evaluations and interventions may be delayed as a result of limited staffing during and after the pandemic.*  Note:  This document was prepared using Dragon voice recognition software and may include unintentional dictation errors.   Darci Current, MD 08/11/20 [phone]

## 2020-08-10 NOTE — ED Triage Notes (Signed)
Pt to ED for chief complaint of mid abdominal pain that started yesterday with diarrhea, chills, fever. Denies vomiting.   reports taking tylenol with no relief  Negative for COVID

## 2020-08-11 MED ORDER — POLYETHYLENE GLYCOL 3350 17 G PO PACK: 17.0000 g | PACK | Freq: Every day | ORAL | Status: DC

## 2020-11-02 ENCOUNTER — Ambulatory Visit
Admission: RE | Admit: 2020-11-02 | Discharge: 2020-11-02 | Disposition: A | Discharge status: Home / Self Care | Payer: Self-pay | Source: Ambulatory Visit | Attending: Urgent Care | Admitting: Urgent Care

## 2020-11-02 ENCOUNTER — Other Ambulatory Visit: Payer: Self-pay

## 2020-11-02 VITALS — BP 135/85 | HR 93 | Temp 97.9°F | Resp 16

## 2020-11-02 DIAGNOSIS — Z1152 Encounter for screening for COVID-19: Secondary | ICD-10-CM

## 2020-11-02 DIAGNOSIS — R059 Cough, unspecified: Secondary | ICD-10-CM

## 2020-11-02 DIAGNOSIS — R0989 Other specified symptoms and signs involving the circulatory and respiratory systems: Secondary | ICD-10-CM

## 2020-11-02 DIAGNOSIS — B349 Viral infection, unspecified: Secondary | ICD-10-CM

## 2020-11-02 MED ORDER — CETIRIZINE HCL 10 MG PO TABS
10.0000 mg | ORAL_TABLET | Freq: Every day | ORAL | 0 refills | Status: DC
Start: 2020-11-02 — End: 2022-04-30

## 2020-11-02 MED ORDER — PROMETHAZINE-DM 6.25-15 MG/5ML PO SYRP
5.0000 mL | ORAL_SOLUTION | Freq: Every evening | ORAL | 0 refills | Status: DC | PRN
Start: 2020-11-02 — End: 2021-01-03

## 2020-11-02 MED ORDER — PSEUDOEPHEDRINE HCL 60 MG PO TABS
60.0000 mg | ORAL_TABLET | Freq: Three times a day (TID) | ORAL | 0 refills | Status: DC | PRN
Start: 2020-11-02 — End: 2021-01-03

## 2020-11-02 MED ORDER — BENZONATATE 100 MG PO CAPS: 100.0000 mg | ORAL_CAPSULE | Freq: Three times a day (TID) | ORAL | 0 refills | Status: DC | PRN

## 2020-11-02 NOTE — ED Triage Notes (Signed)
Pt said she last week she started having a cough. Neck pain, lost her voice and feels like this is coming and going for the past week. The cough is more in her chest now.

## 2020-11-02 NOTE — ED Provider Notes (Signed)
[facility]   MRN: [id number] DOB: 1990/12/02  Subjective:   Cathy Webster is a 30 y.o. female presenting for 1 week history persistent intermittent cough, sinus headache, sinus congestion.  Feels mucus in her throat.  Feels like her cough is now coming from her chest.  She has not Covid vaccinated.  Denies active chest pain, shortness of breath.  No history of lung disorders.  She is not a smoker.  No current facility-administered medications for this encounter.  Current Outpatient Medications:  .  famotidine (PEPCID) 40 MG tablet, Take 1 tablet (40 mg total) by mouth daily., Disp: 30 tablet, Rfl: 0 .  hydrOXYzine (ATARAX/VISTARIL) 50 MG tablet, Take 1 tablet (50 mg total) by mouth 3 (three) times daily as needed for itching., Disp: 15 tablet, Rfl: 0 .  methylPREDNISolone (MEDROL DOSEPAK) 4 MG TBPK tablet, Take Tapered dose as directed.  First dose in the morning., Disp: 21 tablet, Rfl: 0 .  ondansetron (ZOFRAN ODT) 4 MG disintegrating tablet, Take 1 tablet (4 mg total) by mouth every 8 (eight) hours as needed for nausea or vomiting., Disp: 21 tablet, Rfl: 0 .  sucralfate (CARAFATE) 1 g tablet, Take 1 tablet (1 g total) by mouth 4 (four) times daily -  with meals and at bedtime for 7 days., Disp: 28 tablet, Rfl: 0   No Known Allergies  Past Medical History:  Diagnosis Date  . Abnormal Pap smear 2008  . COVID-19 10/20/2019   Diagnosed on 10/20/2019  . Cystitis 04/09/16  . Hives   . Hypertension   . Preeclampsia   . UTI (lower urinary tract infection)      Past Surgical History:  Procedure Laterality Date  . CESAREAN SECTION    . LAPAROSCOPY N/A 09/26/2018   Procedure: LAPAROSCOPY OPERATIVE WITH LEFT SALPINGECTOMY;  Surgeon: Reva Bores, MD;  Location: [facility];  Service: Gynecology;  Laterality: N/A;  . TONSILLECTOMY      Family History  Problem Relation Age of Onset  . Diabetes Mother   . Hypertension Mother   . Asthma Brother     Social History    Tobacco Use  . Smoking status: Never Smoker  . Smokeless tobacco: Never Used  Vaping Use  . Vaping Use: Never used  Substance Use Topics  . Alcohol use: No  . Drug use: No    ROS   Objective:   Vitals: BP 135/85 (BP Location: Right Arm)   Pulse 93   Temp 97.9 F (36.6 C) (Oral)   Resp 16   SpO2 99%   Physical Exam Constitutional:      General: She is not in acute distress.    Appearance: Normal appearance. She is well-developed. She is obese. She is not ill-appearing, toxic-appearing or diaphoretic.  HENT:     Head: Normocephalic and atraumatic.     Nose: Nose normal.     Mouth/Throat:     Mouth: Mucous membranes are moist.  Eyes:     Extraocular Movements: Extraocular movements intact.     Pupils: Pupils are equal, round, and reactive to light.  Cardiovascular:     Rate and Rhythm: Normal rate and regular rhythm.     Pulses: Normal pulses.     Heart sounds: Normal heart sounds. No murmur heard. No friction rub. No gallop.   Pulmonary:     Effort: Pulmonary effort is normal. No respiratory distress.     Breath sounds: Normal breath sounds. No stridor. No wheezing, rhonchi or rales.  Skin:  General: Skin is warm and dry.     Findings: No rash.  Neurological:     Mental Status: She is alert and oriented to person, place, and time.  Psychiatric:        Mood and Affect: Mood normal.        Behavior: Behavior normal.        Thought Content: Thought content normal.     Assessment and Plan :   PDMP not reviewed this encounter.  1. Viral syndrome   2. Encounter for screening for COVID-19   3. Cough   4. Chest congestion     Will manage for viral illness such as viral URI, viral syndrome, viral rhinitis, COVID-19. Counseled patient on nature of COVID-19 including modes of transmission, diagnostic testing, management and supportive care.  Offered scripts for symptomatic relief. COVID 19 testing is pending. Counseled patient on potential for adverse effects  with medications prescribed/recommended today, ER and return-to-clinic precautions discussed, patient verbalized understanding.     Wallis Bamberg, [place] 11/02/20 1940

## 2020-11-02 NOTE — Discharge Instructions (Addendum)

## 2020-11-06 LAB — NOVEL CORONAVIRUS, NAA: SARS-CoV-2, NAA: NOT DETECTED

## 2020-12-19 ENCOUNTER — Other Ambulatory Visit: Payer: Self-pay

## 2020-12-19 ENCOUNTER — Ambulatory Visit (INDEPENDENT_AMBULATORY_CARE_PROVIDER_SITE_OTHER): Payer: BC Managed Care – PPO | Admitting: Obstetrics and Gynecology

## 2020-12-19 ENCOUNTER — Encounter: Payer: Self-pay | Admitting: Obstetrics and Gynecology

## 2020-12-19 VITALS — BP 138/90 | Ht 66.0 in | Wt 309.0 lb

## 2020-12-19 DIAGNOSIS — Z8759 Personal history of other complications of pregnancy, childbirth and the puerperium: Secondary | ICD-10-CM

## 2020-12-19 DIAGNOSIS — Z3201 Encounter for pregnancy test, result positive: Secondary | ICD-10-CM | POA: Diagnosis not present

## 2020-12-19 DIAGNOSIS — N926 Irregular menstruation, unspecified: Secondary | ICD-10-CM

## 2020-12-19 LAB — POCT URINE PREGNANCY: Preg Test, Ur: NEGATIVE

## 2020-12-19 NOTE — Progress Notes (Signed)
Obstetrics & Gynecology Office Visit   Chief Complaint:  Chief Complaint  Patient presents with  . Gynecologic Exam    Positive pregnancy test with IUD - RM 4    History of Present Illness: 30 y.o. [id number] with several faint positive home UPT's.  The patient had a Liletta IUD placed after the her last delivery in 03/2016.  She subsequently was treated for a ruptured ectopic with left salpingectomy in 2019.  At that time IUD was noted to be in appropriate position.  She reports amenorrhea on her Liletta but does experience moliminal symptoms just no bleeding.  She has noted some more pronounced breast tenderness and fatigue which prompted her to take a home urine pregnancy.  No abdominal pain or bleeding reported by patient.  UPT in clinic today negative.    Review of Systems: Review of Systems  Constitutional: Negative.   Gastrointestinal: Negative.   Genitourinary: Negative.    Past Medical History:  Past Medical History:  Diagnosis Date  . Abnormal Pap smear 2008  . COVID-19 10/20/2019   Diagnosed on 10/20/2019  . Cystitis 04/09/16  . Hives   . Hypertension   . Preeclampsia   . UTI (lower urinary tract infection)     Past Surgical History:  Past Surgical History:  Procedure Laterality Date  . CESAREAN SECTION    . LAPAROSCOPY N/A 09/26/2018   Procedure: LAPAROSCOPY OPERATIVE WITH LEFT SALPINGECTOMY;  Surgeon: Reva Bores, MD;  Location: [facility];  Service: Gynecology;  Laterality: N/A;  . TONSILLECTOMY      Gynecologic History: No LMP recorded. (Menstrual status: IUD).  Obstetric History: [id number]  Family History:  Family History  Problem Relation Age of Onset  . Diabetes Mother   . Hypertension Mother   . Asthma Brother     Social History:  Social History   Socioeconomic History  . Marital status: Single    Spouse name: Not on file  . Number of children: Not on file  . Years of education: Not on file  . Highest education level: Not on file   Occupational History  . Not on file  Tobacco Use  . Smoking status: Never Smoker  . Smokeless tobacco: Never Used  Vaping Use  . Vaping Use: Never used  Substance and Sexual Activity  . Alcohol use: No  . Drug use: No  . Sexual activity: Yes    Birth control/protection: I.U.D.  Other Topics Concern  . Not on file  Social History Narrative  . Not on file   Social Determinants of Health   Financial Resource Strain: Not on file  Food Insecurity: No Food Insecurity  . Worried About Programme researcher, broadcasting/film/video in the Last Year: Never true  . Ran Out of Food in the Last Year: Never true  Transportation Needs: No Transportation Needs  . Lack of Transportation (Medical): No  . Lack of Transportation (Non-Medical): No  Physical Activity: Not on file  Stress: Not on file  Social Connections: Not on file  Intimate Partner Violence: Not on file    Allergies:  No Known Allergies  Medications: Prior to Admission medications   Medication Sig Start Date End Date Taking? Authorizing Provider  cetirizine (ZYRTEC ALLERGY) 10 MG tablet Take 1 tablet (10 mg total) by mouth daily. 11/02/20  Yes Wallis Bamberg, PA-C  hydrOXYzine (ATARAX/VISTARIL) 50 MG tablet Take 1 tablet (50 mg total) by mouth 3 (three) times daily as needed for itching. 10/22/19  Yes Joni Reining,  PA-C  benzonatate (TESSALON) 100 MG capsule Take 1-2 capsules (100-200 mg total) by mouth 3 (three) times daily as needed for cough. Patient not taking: Reported on 12/19/2020 11/02/20   Wallis Bamberg, PA-C  famotidine (PEPCID) 40 MG tablet Take 1 tablet (40 mg total) by mouth daily. Patient not taking: Reported on 12/19/2020 08/09/20   Hall-Potvin, [place], PA-C  methylPREDNISolone (MEDROL DOSEPAK) 4 MG TBPK tablet Take Tapered dose as directed.  First dose in the morning. 10/22/19   Joni Reining, PA-C  ondansetron (ZOFRAN ODT) 4 MG disintegrating tablet Take 1 tablet (4 mg total) by mouth every 8 (eight) hours as needed for nausea or  vomiting. Patient not taking: Reported on 12/19/2020 08/09/20   Hall-Potvin, [place], PA-C  promethazine-dextromethorphan (PROMETHAZINE-DM) 6.25-15 MG/5ML syrup Take 5 mLs by mouth at bedtime as needed for cough. Patient not taking: Reported on 12/19/2020 11/02/20   Wallis Bamberg, PA-C  pseudoephedrine (SUDAFED) 60 MG tablet Take 1 tablet (60 mg total) by mouth every 8 (eight) hours as needed for congestion. Patient not taking: Reported on 12/19/2020 11/02/20   Wallis Bamberg, PA-C  sucralfate (CARAFATE) 1 g tablet Take 1 tablet (1 g total) by mouth 4 (four) times daily -  with meals and at bedtime for 7 days. 08/09/20 08/16/20  Hall-Potvin, [place], PA-C  fluticasone (FLONASE) 50 MCG/ACT nasal spray Place 2 sprays into both nostrils daily. 08/19/19 10/22/19  Menshew, Charlesetta Ivory, PA-C    Physical Exam Vitals:  Vitals:   12/19/20 1030  BP: 138/90   No LMP recorded. (Menstrual status: IUD).  General: NAD HEENT: normocephalic, anicteric Pulmonary: No increased work of breathing Genitourinary:  External: Normal external female genitalia.  Normal urethral meatus, normal  Bartholin's and Skene's glands.    Vagina: Normal vaginal mucosa, no evidence of prolapse.    Cervix: Grossly normal in appearance, no bleeding, IUD string visualized  Uterus: Non-enlarged, mobile, normal contour.  No CMT  Adnexa: ovaries non-enlarged, no adnexal masses  Rectal: deferred  Lymphatic: no evidence of inguinal lymphadenopathy Extremities: no edema, erythema, or tenderness Neurologic: Grossly intact Psychiatric: mood appropriate, affect full  Female chaperone present for pelvic  portions of the physical exam  Assessment: 30 y.o. [id number] with positive home UPT in the setting of current IUD use and history of left ectopic pregnancy treated with salpingectomy Plan: Problem List Items Addressed This Visit   None   Visit Diagnoses    Missed period    -  Primary   Relevant Orders   POCT urine pregnancy (Completed)    Beta hCG quant (ref lab)   Positive urine pregnancy test       Relevant Orders   Beta hCG quant (ref lab)   History of ectopic pregnancy       Relevant Orders   Beta hCG quant (ref lab)     1) Negative UPT with positive home UPT - in the setting of prior ectopic with this current Liletta will obtain serum HCG to verify negative.  Otherwise no complaints other than some moliminal symptoms (breast tenderness, fatigue). - if HCG negative no further follow up - if positive HCG will trend and obtained TVUS  2) A total of 15 minutes were spent in face-to-face contact with the patient during this encounter with over half of that time devoted to counseling and coordination of care.  3) Return if symptoms worsen or fail to improve.    Vena Austria, MD, [facility], [facility]

## 2020-12-20 LAB — BETA HCG QUANT (REF LAB): hCG Quant: <1 m[IU]/mL

## 2021-01-03 ENCOUNTER — Encounter (HOSPITAL_BASED_OUTPATIENT_CLINIC_OR_DEPARTMENT_OTHER): Payer: Self-pay | Admitting: Emergency Medicine

## 2021-01-03 ENCOUNTER — Other Ambulatory Visit: Payer: Self-pay

## 2021-01-03 ENCOUNTER — Emergency Department (HOSPITAL_BASED_OUTPATIENT_CLINIC_OR_DEPARTMENT_OTHER)
Admission: EM | Admit: 2021-01-03 | Discharge: 2021-01-03 | Disposition: A | Discharge status: Home / Self Care | Payer: BC Managed Care – PPO | Attending: Emergency Medicine | Admitting: Emergency Medicine

## 2021-01-03 DIAGNOSIS — M545 Low back pain, unspecified: Secondary | ICD-10-CM | POA: Insufficient documentation

## 2021-01-03 DIAGNOSIS — Z8616 Personal history of COVID-19: Secondary | ICD-10-CM | POA: Insufficient documentation

## 2021-01-03 DIAGNOSIS — I10 Essential (primary) hypertension: Secondary | ICD-10-CM | POA: Diagnosis not present

## 2021-01-03 DIAGNOSIS — S39012A Strain of muscle, fascia and tendon of lower back, initial encounter: Secondary | ICD-10-CM

## 2021-01-03 MED ORDER — LIDOCAINE 5 % EX PTCH: 1.0000 | MEDICATED_PATCH | CUTANEOUS | 0 refills | Status: DC

## 2021-01-03 MED ORDER — METHOCARBAMOL 500 MG PO TABS: 500.0000 mg | ORAL_TABLET | Freq: Two times a day (BID) | ORAL | 0 refills | Status: DC

## 2021-01-03 NOTE — ED Provider Notes (Signed)
[facility] EMERGENCY DEPT Provider Note   CSN: [id number] Arrival date & time: 01/03/21  0932     History Chief Complaint  Patient presents with  . Back Pain    Cathy Webster is a 30 y.o. female.  30 year old female presents with 24-hour history of back pain characterizes dull.  Pain starts in her lower back and is midline without radiation to her legs.  No bowel bladder dysfunction.  Pain is worse with certain positions.  No prior history of same.  Denies any urinary symptoms.        Past Medical History:  Diagnosis Date  . Abnormal Pap smear 2008  . COVID-19 10/20/2019   Diagnosed on 10/20/2019  . Cystitis 04/09/16  . Hives   . Hypertension   . Preeclampsia   . UTI (lower urinary tract infection)     Patient Active Problem List   Diagnosis Date Noted  . IUD check up 09/15/2019  . Panic attack 10/09/2018  . Contraceptive management 04/09/2016  . Preeclampsia in postpartum period 03/01/2016  . Abnormal Pap smear of cervix 12/22/2013    Past Surgical History:  Procedure Laterality Date  . CESAREAN SECTION    . LAPAROSCOPY N/A 09/26/2018   Procedure: LAPAROSCOPY OPERATIVE WITH LEFT SALPINGECTOMY;  Surgeon: Reva Bores, MD;  Location: [facility];  Service: Gynecology;  Laterality: N/A;  . TONSILLECTOMY       OB History    Gravida  5   Para  4   Term  4   Preterm      AB  1   Living  4     SAB      IAB      Ectopic  1   Multiple  0   Live Births  4           Family History  Problem Relation Age of Onset  . Diabetes Mother   . Hypertension Mother   . Asthma Brother     Social History   Tobacco Use  . Smoking status: Never Smoker  . Smokeless tobacco: Never Used  Vaping Use  . Vaping Use: Never used  Substance Use Topics  . Alcohol use: No  . Drug use: No    Home Medications Prior to Admission medications   Medication Sig Start Date End Date Taking? Authorizing Provider  cetirizine (ZYRTEC ALLERGY) 10 MG  tablet Take 1 tablet (10 mg total) by mouth daily. 11/02/20  Yes Wallis Bamberg, PA-C  hydrOXYzine (ATARAX/VISTARIL) 50 MG tablet Take 1 tablet (50 mg total) by mouth 3 (three) times daily as needed for itching. 10/22/19   Joni Reining, PA-C  methylPREDNISolone (MEDROL DOSEPAK) 4 MG TBPK tablet Take Tapered dose as directed.  First dose in the morning. 10/22/19   Joni Reining, PA-C  fluticasone (FLONASE) 50 MCG/ACT nasal spray Place 2 sprays into both nostrils daily. 08/19/19 10/22/19  Menshew, Charlesetta Ivory, PA-C    Allergies    Patient has no known allergies.  Review of Systems   Review of Systems  All other systems reviewed and are negative.   Physical Exam Updated Vital Signs BP 127/84 (BP Location: Right Arm)   Pulse 96   Temp 98.1 F (36.7 C) (Oral)   Resp 18   Ht 1.676 m (5\' 6" )   Wt (!) 138.3 kg   SpO2 100%   BMI 49.23 kg/m   Physical Exam Vitals and nursing note reviewed.  Constitutional:      General: She  is not in acute distress.    Appearance: Normal appearance. She is well-developed. She is not toxic-appearing.  HENT:     Head: Normocephalic and atraumatic.  Eyes:     General: Lids are normal.     Conjunctiva/sclera: Conjunctivae normal.     Pupils: Pupils are equal, round, and reactive to light.  Neck:     Thyroid: No thyroid mass.     Trachea: No tracheal deviation.  Cardiovascular:     Rate and Rhythm: Normal rate and regular rhythm.     Heart sounds: Normal heart sounds. No murmur heard. No gallop.   Pulmonary:     Effort: Pulmonary effort is normal. No respiratory distress.     Breath sounds: Normal breath sounds. No stridor. No decreased breath sounds, wheezing, rhonchi or rales.  Abdominal:     General: Bowel sounds are normal. There is no distension.     Palpations: Abdomen is soft.     Tenderness: There is no abdominal tenderness. There is no rebound.  Musculoskeletal:        General: No tenderness. Normal range of motion.     Cervical back:  Normal range of motion and neck supple.     Lumbar back: Spasms and bony tenderness present.       Back:  Skin:    General: Skin is warm and dry.     Findings: No abrasion or rash.  Neurological:     Mental Status: She is alert and oriented to person, place, and time.     GCS: GCS eye subscore is 4. GCS verbal subscore is 5. GCS motor subscore is 6.     Cranial Nerves: No cranial nerve deficit.     Sensory: No sensory deficit.  Psychiatric:        Speech: Speech normal.        Behavior: Behavior normal.     ED Results / Procedures / Treatments   Labs (all labs ordered are listed, but only abnormal results are displayed) Labs Reviewed - No data to display  EKG None  Radiology No results found.  Procedures Procedures   Medications Ordered in ED Medications - No data to display  ED Course  I have reviewed the triage vital signs and the nursing notes.  Pertinent labs & imaging results that were available during my care of the patient were reviewed by me and considered in my medical decision making (see chart for details).    MDM Rules/Calculators/A&P                         Patient with likely MSK back pain.  Will treat with lidocaine patches anti-inflammatories.  Return precautions given Final Clinical Impression(s) / ED Diagnoses Final diagnoses:  None    Rx / DC Orders ED Discharge Orders    None       Lorre Nick, MD 01/03/21 [phone]

## 2021-01-03 NOTE — ED Triage Notes (Signed)
Pt stated that she started having back pain yesterday and the pain is radiating from the lower back up her spine to her neck and shoulders.

## 2021-01-03 NOTE — ED Notes (Signed)
ED Provider at bedside. 

## 2021-01-05 ENCOUNTER — Ambulatory Visit (INDEPENDENT_AMBULATORY_CARE_PROVIDER_SITE_OTHER): Payer: BC Managed Care – PPO | Admitting: Physician Assistant

## 2021-01-05 ENCOUNTER — Other Ambulatory Visit: Payer: Self-pay

## 2021-01-05 ENCOUNTER — Encounter: Payer: Self-pay | Admitting: Physician Assistant

## 2021-01-05 DIAGNOSIS — R519 Headache, unspecified: Secondary | ICD-10-CM

## 2021-01-05 DIAGNOSIS — E538 Deficiency of other specified B group vitamins: Secondary | ICD-10-CM

## 2021-01-05 DIAGNOSIS — E559 Vitamin D deficiency, unspecified: Secondary | ICD-10-CM

## 2021-01-05 DIAGNOSIS — R29818 Other symptoms and signs involving the nervous system: Secondary | ICD-10-CM

## 2021-01-05 DIAGNOSIS — G43709 Chronic migraine without aura, not intractable, without status migrainosus: Secondary | ICD-10-CM

## 2021-01-05 DIAGNOSIS — N2581 Secondary hyperparathyroidism of renal origin: Secondary | ICD-10-CM

## 2021-01-05 DIAGNOSIS — Z7689 Persons encountering health services in other specified circumstances: Secondary | ICD-10-CM | POA: Diagnosis not present

## 2021-01-05 DIAGNOSIS — R5383 Other fatigue: Secondary | ICD-10-CM

## 2021-01-05 DIAGNOSIS — Z6841 Body Mass Index (BMI) 40.0 and over, adult: Secondary | ICD-10-CM

## 2021-01-05 MED ORDER — CYANOCOBALAMIN 1000 MCG/ML IJ SOLN
1000.0000 ug | Freq: Once | INTRAMUSCULAR | Status: AC
  Administered 2021-01-05: 1000 ug via INTRAMUSCULAR

## 2021-01-05 MED ORDER — SUMATRIPTAN SUCCINATE 50 MG PO TABS: 50.0000 mg | ORAL_TABLET | ORAL | 0 refills | Status: DC | PRN

## 2021-01-05 MED ORDER — ERGOCALCIFEROL 1.25 MG (50000 UT) PO CAPS: ORAL_CAPSULE | ORAL | 0 refills | Status: DC

## 2021-01-05 NOTE — Progress Notes (Signed)
[facility] [address] [place], [place] [address]  Internal MEDICINE  Office Visit Note  Patient Name: Cathy Webster  [id number]  [id number]  Date of Service: 01/08/2021   Complaints/HPI Pt is here for establishment of PCP. Chief Complaint  Patient presents with  . New Patient (Initial Visit)  . Numbness    Right arm some times and she had headaches   . Medical Management of Chronic Issues    Weight managment   HPI Here to establish care. Decided it was time to find a PCP especially with some changes. -Pt also notes occasional R arm numbness which is accompanied by headaches and 2 episodes of vision changes with these occurrences. Also gets nauseous with these episodes. One episode had leg shaking and ankle swelling but assumed that was from sitting too long. These episodes started in the last 6 months, most recently 3 weeks ago. 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 Has been to bariatric wt loss clinic and been through seminars. Concerned about financial aspect and needs to do psych portion still. Has been contemplating when to have it. -went to ED with low back pain a few days ago -labs done at [facility]--vit D low, B12 low, PTH elevated, transferrin low but iron borderline low  Current Medication: Outpatient Encounter Medications as of 01/05/2021  Medication Sig  . cetirizine (ZYRTEC ALLERGY) 10 MG tablet Take 1 tablet (10 mg total) by mouth daily.  . ergocalciferol (DRISDOL) 1.25 MG (50000 UT) capsule Take one cap q week  . hydrOXYzine (ATARAX/VISTARIL) 50 MG tablet Take 1 tablet (50 mg total) by mouth 3 (three) times daily as needed for itching.  . lidocaine (LIDODERM) 5 % Place 1 patch onto  the skin daily. Remove & Discard patch within 12 hours or as directed by MD  . methocarbamol (ROBAXIN) 500 MG tablet Take 1 tablet (500 mg total) by mouth 2 (two) times daily.  . [DISCONTINUED] SUMAtriptan (IMITREX) 50 MG tablet Take 1 tablet (50 mg total) by mouth every 2 (two) hours as needed for migraine. May repeat in 2 hours if headache persists or recurs.  . [DISCONTINUED] fluticasone (FLONASE) 50 MCG/ACT nasal spray Place 2 sprays into both nostrils daily.  . [DISCONTINUED] methylPREDNISolone (MEDROL DOSEPAK) 4 MG TBPK tablet Take Tapered dose as directed.  First dose in the morning.  . [EXPIRED] cyanocobalamin ((VITAMIN B-12)) injection 1,000 mcg    No facility-administered encounter medications on file as of 01/05/2021.    Surgical History: Past Surgical History:  Procedure Laterality Date  . CESAREAN SECTION    . LAPAROSCOPY N/A 09/26/2018   Procedure: LAPAROSCOPY OPERATIVE WITH LEFT SALPINGECTOMY;  Surgeon: Reva Bores, MD;  Location: [facility];  Service: Gynecology;  Laterality: N/A;  . TONSILLECTOMY      Medical History: Past Medical History:  Diagnosis Date  . Abnormal Pap smear 2008  . Allergy   . COVID-19 10/20/2019   Diagnosed on 10/20/2019  . Cystitis 04/09/16  . Hives   . Hypertension   . Preeclampsia   . UTI (lower urinary tract infection)     Family History: Family History  Problem Relation Age of Onset  . Diabetes Mother   . Hypertension Mother   . Asthma Brother  Social History   Socioeconomic History  . Marital status: Single    Spouse name: Not on file  . Number of children: Not on file  . Years of education: Not on file  . Highest education level: Not on file  Occupational History  . Not on file  Tobacco Use  . Smoking status: Never Smoker  . Smokeless tobacco: Never Used  Vaping Use  . Vaping Use: Never used  Substance and Sexual Activity  . Alcohol use: No  . Drug use: No  . Sexual activity: Yes    Birth control/protection: I.U.D.   Other Topics Concern  . Not on file  Social History Narrative  . Not on file   Social Determinants of Health   Financial Resource Strain: Not on file  Food Insecurity: No Food Insecurity  . Worried About Programme researcher, broadcasting/film/video in the Last Year: Never true  . Ran Out of Food in the Last Year: Never true  Transportation Needs: No Transportation Needs  . Lack of Transportation (Medical): No  . Lack of Transportation (Non-Medical): No  Physical Activity: Not on file  Stress: Not on file  Social Connections: Not on file  Intimate Partner Violence: Not on file     Review of Systems  Constitutional: Negative for chills and unexpected weight change.  HENT: Negative for congestion, postnasal drip, rhinorrhea, sneezing and sore throat.   Eyes: Positive for visual disturbance. Negative for redness.  Respiratory: Negative for cough, chest tightness and shortness of breath.   Cardiovascular: Negative for chest pain and palpitations.  Gastrointestinal: Negative for abdominal pain, constipation, diarrhea, nausea and vomiting.  Genitourinary: Negative for dysuria and frequency.  Musculoskeletal: Positive for back pain. Negative for arthralgias, joint swelling and neck pain.  Skin: Negative for rash.  Neurological: Positive for numbness and headaches. Negative for tremors.  Hematological: Negative for adenopathy. Does not bruise/bleed easily.  Psychiatric/Behavioral: Negative for behavioral problems (Depression), sleep disturbance and suicidal ideas. The patient is not nervous/anxious.     Vital Signs: BP 129/71   Pulse 83   Temp 97.9 F (36.6 C)   Resp 16   Ht 5\' 6"  (1.676 m)   Wt (!) 311 lb (141.1 kg)   SpO2 99%   BMI 50.20 kg/m    Physical Exam Vitals and nursing note reviewed.  Constitutional:      General: She is not in acute distress.    Appearance: She is well-developed. She is obese. She is not diaphoretic.  HENT:     Head: Normocephalic and atraumatic.     Mouth/Throat:      Pharynx: No oropharyngeal exudate.  Eyes:     Pupils: Pupils are equal, round, and reactive to light.  Neck:     Thyroid: No thyromegaly.     Vascular: No JVD.     Trachea: No tracheal deviation.  Cardiovascular:     Rate and Rhythm: Normal rate and regular rhythm.     Heart sounds: Normal heart sounds. No murmur heard. No friction rub. No gallop.   Pulmonary:     Effort: Pulmonary effort is normal. No respiratory distress.     Breath sounds: No wheezing or rales.  Chest:     Chest wall: No tenderness.  Abdominal:     General: Bowel sounds are normal.     Palpations: Abdomen is soft.  Musculoskeletal:        General: Normal range of motion.     Cervical back: Normal range of motion and neck  supple.     Right lower leg: No edema.     Left lower leg: No edema.     Comments: Low back pain with movement, but non TTP  Lymphadenopathy:     Cervical: No cervical adenopathy.  Skin:    General: Skin is warm and dry.  Neurological:     Mental Status: She is alert and oriented to person, place, and time.     Cranial Nerves: No cranial nerve deficit.     Sensory: No sensory deficit.  Psychiatric:        Behavior: Behavior normal.        Thought Content: Thought content normal.        Judgment: Judgment normal.       Assessment/Plan: 1. Encounter to establish care with new doctor Reviewed labs from outside department, will obtain additional labs  2. Headache with neurologic deficit Change in headache type/severity in the last 6 months with additional numbness in extremity and limb shaking in one instance. Pt has strong family hx of sarcoidosis of both the brain and lungs with mother having a stroke at age 26. Will obtain CT for further evaluation. - CT Head Wo Contrast  3. Vitamin D deficiency Significantly low vitamin D, will start supplement. - ergocalciferol (DRISDOL) 1.25 MG (50000 UT) capsule; Take one cap q week  Dispense: 12 capsule; Refill: 0  4. Secondary  hyperparathyroidism (HCC) Elevated PTH, low normal calcium and very low vitamin D on recent labs, will supplement Vitamin D and continue to monitor.  5. B12 deficiency Low B12 on recent labs will supplement weekly x4 weeks then monthly and continue to monitor. - cyanocobalamin ((VITAMIN B-12)) injection 1,000 mcg  6. Morbid obesity with BMI of 50.0-59.9, adult (HCC) Pt is followed by bariatric surgery and is going through pre-surgical steps currently. She will continue to work on diet and exercise goals.  7. Other fatigue - TSH + free T4 - Sedimentation rate; Future   General Counseling: Destini verbalizes understanding of the findings of todays visit and agrees with plan of treatment. I have discussed any further diagnostic evaluation that may be needed or ordered today. We also reviewed her medications today. she has been encouraged to call the office with any questions or concerns that should arise related to todays visit.    Counseling:    Orders Placed This Encounter  Procedures  . CT Head Wo Contrast  . TSH + free T4  . Sedimentation rate    Meds ordered this encounter  Medications  . ergocalciferol (DRISDOL) 1.25 MG (50000 UT) capsule    Sig: Take one cap q week    Dispense:  12 capsule    Refill:  0  . DISCONTD: SUMAtriptan (IMITREX) 50 MG tablet    Sig: Take 1 tablet (50 mg total) by mouth every 2 (two) hours as needed for migraine. May repeat in 2 hours if headache persists or recurs.    Dispense:  10 tablet    Refill:  0  . cyanocobalamin ((VITAMIN B-12)) injection 1,000 mcg     This patient was seen by Lynn Ito, PA-C in collaboration with Dr. Beverely Risen as a part of collaborative care agreement.   Time spent:40 Minutes

## 2021-01-09 ENCOUNTER — Telehealth: Payer: Self-pay

## 2021-01-09 NOTE — Telephone Encounter (Signed)
Scheduled patient at the [facility] on 01/24/21 for CT at 4:00pm. Tried calling patient to inform of appt. No vm.

## 2021-01-18 ENCOUNTER — Other Ambulatory Visit: Payer: Self-pay | Admitting: Physician Assistant

## 2021-01-18 DIAGNOSIS — Z789 Other specified health status: Secondary | ICD-10-CM

## 2021-01-19 ENCOUNTER — Other Ambulatory Visit: Payer: Self-pay | Admitting: Physician Assistant

## 2021-01-19 DIAGNOSIS — Z789 Other specified health status: Secondary | ICD-10-CM

## 2021-01-23 ENCOUNTER — Encounter: Payer: Self-pay | Admitting: *Deleted

## 2021-01-23 ENCOUNTER — Ambulatory Visit (INDEPENDENT_AMBULATORY_CARE_PROVIDER_SITE_OTHER): Payer: BC Managed Care – PPO

## 2021-01-23 ENCOUNTER — Other Ambulatory Visit: Payer: Self-pay

## 2021-01-23 DIAGNOSIS — E538 Deficiency of other specified B group vitamins: Secondary | ICD-10-CM

## 2021-01-24 ENCOUNTER — Ambulatory Visit
Admission: RE | Admit: 2021-01-24 | Discharge: 2021-01-24 | Disposition: A | Discharge status: Home / Self Care | Payer: BC Managed Care – PPO | Source: Ambulatory Visit | Attending: Physician Assistant | Admitting: Physician Assistant

## 2021-01-24 ENCOUNTER — Other Ambulatory Visit: Payer: Self-pay

## 2021-01-24 DIAGNOSIS — R29818 Other symptoms and signs involving the nervous system: Secondary | ICD-10-CM | POA: Insufficient documentation

## 2021-01-24 DIAGNOSIS — E538 Deficiency of other specified B group vitamins: Secondary | ICD-10-CM

## 2021-01-24 DIAGNOSIS — R519 Headache, unspecified: Secondary | ICD-10-CM | POA: Diagnosis not present

## 2021-01-24 LAB — TSH+FREE T4
Free T4: 1.26 ng/dL (ref 0.82–1.77)
TSH: 0.715 u[IU]/mL (ref 0.450–4.500)

## 2021-01-24 MED ORDER — CYANOCOBALAMIN 1000 MCG/ML IJ SOLN
1000.0000 ug | Freq: Once | INTRAMUSCULAR | Status: AC
Start: 2021-01-24 — End: 2021-01-24
  Administered 2021-01-24: 1000 ug via INTRAMUSCULAR

## 2021-01-30 ENCOUNTER — Ambulatory Visit: Payer: BC Managed Care – PPO

## 2021-02-05 ENCOUNTER — Other Ambulatory Visit: Payer: Self-pay | Admitting: Physician Assistant

## 2021-02-05 DIAGNOSIS — G43709 Chronic migraine without aura, not intractable, without status migrainosus: Secondary | ICD-10-CM

## 2021-03-07 ENCOUNTER — Other Ambulatory Visit: Payer: Self-pay

## 2021-03-07 ENCOUNTER — Ambulatory Visit (INDEPENDENT_AMBULATORY_CARE_PROVIDER_SITE_OTHER): Payer: BC Managed Care – PPO | Admitting: Gastroenterology

## 2021-03-07 VITALS — BP 133/80 | HR 94 | Ht 66.0 in | Wt 307.4 lb

## 2021-03-07 DIAGNOSIS — R1033 Periumbilical pain: Secondary | ICD-10-CM

## 2021-03-07 MED ORDER — DICYCLOMINE HCL 20 MG PO TABS: 20.0000 mg | ORAL_TABLET | Freq: Three times a day (TID) | ORAL | 3 refills | Status: DC

## 2021-03-07 NOTE — Progress Notes (Signed)
Gastroenterology Consultation  Referring Provider:     Carlean Jews, PA* Primary Care Physician:  Carlean Jews, PA-C Primary Gastroenterologist:  Dr. Servando Snare     Reason for Consultation:    Chronic abdominal pain        HPI:   Cathy Webster is a 30 y.o. y/o female referred for consultation & management of chronic abdominal pain  by Dr. Lewis Moccasin, Salomon Fick, PA-C.  This patient comes in today after having a history of epigastric pain that has resulted in her visiting urgent care back in November 2021.  The patient has been recently seen by a bariatric surgeon and had an upper endoscopy at [facility] 11 days ago.  I do not have any record of what this procedure showed only that it took place the patient has a history of morbid obesity and there is an evaluation underway for bariatric surgery. Worse with sitting and walking but better with laying down.She was told in the ER that she has "a bug" and once that she was constipated. The pain is sharp and goes to her back. She has lots of family with IBS. The patient's mother is a patient of mine in the patient's mother had told her daughter come see me.  The patient does not reported to be any better or worse with any particular foods.  She also reports that she has not had any unexplained weight loss.  Past Medical History:  Diagnosis Date  . Abnormal Pap smear 2008  . Allergy   . COVID-19 10/20/2019   Diagnosed on 10/20/2019  . Cystitis 04/09/16  . Hives   . Hypertension   . Preeclampsia   . UTI (lower urinary tract infection)     Past Surgical History:  Procedure Laterality Date  . CESAREAN SECTION    . LAPAROSCOPY N/A 09/26/2018   Procedure: LAPAROSCOPY OPERATIVE WITH LEFT SALPINGECTOMY;  Surgeon: Reva Bores, MD;  Location: [facility];  Service: Gynecology;  Laterality: N/A;  . TONSILLECTOMY      Prior to Admission medications   Medication Sig Start Date End Date Taking? Authorizing Provider   cetirizine (ZYRTEC ALLERGY) 10 MG tablet Take 1 tablet (10 mg total) by mouth daily. 11/02/20   Wallis Bamberg, PA-C  ergocalciferol (DRISDOL) 1.25 MG (50000 UT) capsule Take one cap q week 01/05/21   McDonough, Salomon Fick, PA-C  hydrOXYzine (ATARAX/VISTARIL) 50 MG tablet Take 1 tablet (50 mg total) by mouth 3 (three) times daily as needed for itching. 10/22/19   Joni Reining, PA-C  lidocaine (LIDODERM) 5 % Place 1 patch onto the skin daily. Remove & Discard patch within 12 hours or as directed by MD 01/03/21   Lorre Nick, MD  methocarbamol (ROBAXIN) 500 MG tablet Take 1 tablet (500 mg total) by mouth 2 (two) times daily. 01/03/21   Lorre Nick, MD  fluticasone [facility]) 50 MCG/ACT nasal spray Place 2 sprays into both nostrils daily. 08/19/19 10/22/19  Menshew, Charlesetta Ivory, PA-C    Family History  Problem Relation Age of Onset  . Diabetes Mother   . Hypertension Mother   . Asthma Brother      Social History   Tobacco Use  . Smoking status: Never Smoker  . Smokeless tobacco: Never Used  Vaping Use  . Vaping Use: Never used  Substance Use Topics  . Alcohol use: No  . Drug use: No    Allergies as of 03/07/2021  . (No Known Allergies)    Review  of Systems:    All systems reviewed and negative except where noted in HPI.   Physical Exam:  There were no vitals taken for this visit. No LMP recorded. (Menstrual status: IUD). General:   Alert,  Well-developed, well-nourished, pleasant and cooperative in NAD Head:  Normocephalic and atraumatic. Eyes:  Sclera clear, no icterus.   Conjunctiva pink. Ears:  Normal auditory acuity. Neck:  Supple; no masses or thyromegaly. Lungs:  Respirations even and unlabored.  Clear throughout to auscultation.   No wheezes, crackles, or rhonchi. No acute distress. Heart:  Regular rate and rhythm; no murmurs, clicks, rubs, or gallops. Abdomen:  Normal bowel sounds.  No bruits.  Soft, non-tender and non-distended without masses, hepatosplenomegaly or  hernias noted.  No guarding or rebound tenderness.  Negative Carnett sign.   Rectal:  Deferred.  Pulses:  Normal pulses noted. Extremities:  No clubbing or edema.  No cyanosis. Neurologic:  Alert and oriented x3;  grossly normal neurologically. Skin:  Intact without significant lesions or rashes.  No jaundice. Lymph Nodes:  No significant cervical adenopathy. Psych:  Alert and cooperative. Normal mood and affect.  Imaging Studies: No results found.  Assessment and Plan:   Cathy Webster is a 30 y.o. y/o female Who comes in with chronic intermittent abdominal pain that can last for 4-5 days.  She does not have the symptoms every day nor does she have it every week when she has these attacks she sometimes has to call out of work.  She reports it to be worse with position or movement but on the physical exam does not appear to be musculoskeletal.  The tenderness is medial and above the umbilicus.  The patient will be started on a trial of dicyclomine to be taken when she has seasonal attacks to see if they abate the attacks.  The patient has been told to contact me to let me how her symptoms respond to dicyclomine.  The patient has been explained the plan and agrees with it.    Midge Minium, MD. Clementeen Graham    Note: This dictation was prepared with Dragon dictation along with smaller phrase technology. Any transcriptional errors that result from this process are unintentional.

## 2021-03-28 ENCOUNTER — Telehealth: Payer: Self-pay

## 2021-03-28 NOTE — Telephone Encounter (Signed)
Left vm to screen for 03/29/21 appointment-Toni 

## 2021-03-29 ENCOUNTER — Ambulatory Visit (INDEPENDENT_AMBULATORY_CARE_PROVIDER_SITE_OTHER): Payer: BC Managed Care – PPO | Admitting: Nurse Practitioner

## 2021-03-29 ENCOUNTER — Other Ambulatory Visit: Payer: Self-pay

## 2021-03-29 ENCOUNTER — Encounter: Payer: Self-pay | Admitting: Nurse Practitioner

## 2021-03-29 VITALS — BP 121/89 | HR 93 | Temp 98.4°F | Resp 16 | Ht 65.0 in | Wt 301.8 lb

## 2021-03-29 DIAGNOSIS — R300 Dysuria: Secondary | ICD-10-CM | POA: Diagnosis not present

## 2021-03-29 DIAGNOSIS — E538 Deficiency of other specified B group vitamins: Secondary | ICD-10-CM

## 2021-03-29 DIAGNOSIS — Z789 Other specified health status: Secondary | ICD-10-CM

## 2021-03-29 DIAGNOSIS — E559 Vitamin D deficiency, unspecified: Secondary | ICD-10-CM

## 2021-03-29 DIAGNOSIS — Z0001 Encounter for general adult medical examination with abnormal findings: Secondary | ICD-10-CM | POA: Diagnosis not present

## 2021-03-29 MED ORDER — CYANOCOBALAMIN 1000 MCG/ML IJ SOLN
1000.0000 ug | Freq: Once | INTRAMUSCULAR | Status: AC
  Administered 2021-03-29: 1000 ug via INTRAMUSCULAR

## 2021-03-29 NOTE — Progress Notes (Signed)
[facility] [address] [place], [place] [address]  Internal MEDICINE  Office Visit Note  Patient Name: Cathy Webster  [id number]  [id number]  Date of Service: 03/29/2021  Chief Complaint  Patient presents with   Annual Exam    Ct results    HPI Gala presents for an annual well visit and physical exam. she has a history of seasonal allergies, hypertension, and morbid obesity. She declined the COVID vaccine. She lives at home with her children. 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 She denies any other current questions or concerns. She denies any pain. She is due for her pap smear in 2023. She is scheduled on July 8th to have a sleeve gastrectomy.  -CT head was done due to intermittent migraines x6 months with visual disturbance and dizziness. The result was negative for any acute intracranial abnormality. Discussed with patient.  Blood pressure is good today, not currently on any medications for hypertension.    Current Medication: Outpatient Encounter Medications as of 03/29/2021  Medication Sig   cetirizine (ZYRTEC ALLERGY) 10 MG tablet Take 1 tablet (10 mg total) by mouth daily.   dicyclomine (BENTYL) 20 MG tablet Take 1 tablet (20 mg total) by mouth 3 (three) times daily before meals.   hydrOXYzine (ATARAX/VISTARIL) 50 MG tablet Take 1 tablet (50 mg total) by mouth 3 (three) times daily as needed for itching.   methocarbamol (ROBAXIN) 500 MG tablet Take 1 tablet (500 mg total) by mouth 2 (two) times daily.   [DISCONTINUED] cyanocobalamin (,VITAMIN B-12,) 1000 MCG/ML injection Inject into the muscle.   [DISCONTINUED] ergocalciferol (DRISDOL) 1.25 MG (50000 UT) capsule Take one cap q week   ergocalciferol (DRISDOL) 1.25 MG (50000 UT) capsule Take one cap q week   [DISCONTINUED] fluticasone (FLONASE) 50 MCG/ACT nasal spray Place 2 sprays into both nostrils daily.   [DISCONTINUED] lidocaine (LIDODERM) 5 % Place 1 patch onto the skin daily.  Remove & Discard patch within 12 hours or as directed by MD (Patient not taking: No sig reported)   [DISCONTINUED] SUMAtriptan (IMITREX) 50 MG tablet Take by mouth. (Patient not taking: No sig reported)   [EXPIRED] cyanocobalamin ((VITAMIN B-12)) injection 1,000 mcg    No facility-administered encounter medications on file as of 03/29/2021.    Surgical History: Past Surgical History:  Procedure Laterality Date   CESAREAN SECTION     LAPAROSCOPY N/A 09/26/2018   Procedure: LAPAROSCOPY OPERATIVE WITH LEFT SALPINGECTOMY;  Surgeon: Reva Bores, MD;  Location: [facility];  Service: Gynecology;  Laterality: N/A;   TONSILLECTOMY      Medical History: Past Medical History:  Diagnosis Date   Abnormal Pap smear 2008   Allergy    COVID-19 10/20/2019   Diagnosed on 10/20/2019   Cystitis 04/09/16   Hives    Hypertension    Preeclampsia    UTI (lower urinary tract infection)     Family History: Family History  Problem Relation Age of Onset   Diabetes Mother    Hypertension Mother    Asthma Brother     Social History   Socioeconomic History   Marital status: Single    Spouse name: Not on file   Number of children: Not on file   Years of education: Not on file   Highest education level: Not on file  Occupational History   Not on file  Tobacco Use   Smoking status: Never   Smokeless tobacco: Never  Vaping Use   Vaping Use: Never used  Substance and Sexual Activity   Alcohol use: No   Drug use: No   Sexual activity: Yes    Birth control/protection: I.U.D.  Other Topics Concern   Not on file  Social History Narrative   Not on file   Social Determinants of Health   Financial Resource Strain: Not on file  Food Insecurity: Not on file  Transportation Needs: Not on file  Physical Activity: Not on file  Stress: Not on file  Social Connections: Not on file  Intimate Partner Violence: Not on file      Review of Systems  Constitutional:  Negative for chills, fatigue and  unexpected weight change.  HENT:  Negative for congestion, rhinorrhea, sneezing and sore throat.   Eyes:  Negative for redness.  Respiratory:  Negative for cough, chest tightness and shortness of breath.   Cardiovascular:  Negative for chest pain and palpitations.  Gastrointestinal:  Negative for abdominal pain, constipation, diarrhea, nausea and vomiting.  Genitourinary:  Negative for dysuria and frequency.  Musculoskeletal:  Negative for arthralgias, back pain, joint swelling and neck pain.  Skin:  Negative for rash.  Neurological: Negative.  Negative for tremors and numbness.  Hematological:  Negative for adenopathy. Does not bruise/bleed easily.  Psychiatric/Behavioral:  Negative for behavioral problems (Depression), sleep disturbance and suicidal ideas. The patient is not nervous/anxious.    Vital Signs: BP 121/89   Pulse 93   Temp 98.4 F (36.9 C)   Resp 16   Ht 5\' 5"  (1.651 m)   Wt (!) 301 lb 12.8 oz (136.9 kg)   SpO2 97%   BMI 50.22 kg/m    Physical Exam Vitals reviewed.  Constitutional:      General: She is not in acute distress.    Appearance: Normal appearance. She is well-developed and well-groomed. She is morbidly obese. She is not ill-appearing.  HENT:     Head: Normocephalic and atraumatic.  Cardiovascular:     Rate and Rhythm: Normal rate and regular rhythm.     Pulses: Normal pulses.     Heart sounds: Normal heart sounds. No murmur heard. Pulmonary:     Effort: Pulmonary effort is normal. No respiratory distress.     Breath sounds: Normal breath sounds.  Skin:    General: Skin is warm and dry.     Capillary Refill: Capillary refill takes less than 2 seconds.  Neurological:     Mental Status: She is alert and oriented to person, place, and time.  Psychiatric:        Mood and Affect: Mood normal.        Behavior: Behavior normal. Behavior is cooperative.   Assessment/Plan: 1. Encounter for routine adult health examination with abnormal  findings Age-appropriate preventive screenings discussed, annual physical exam completed. Routine labs not ordered because patient will have pre-op testing and labs to do for surgery soon.   2. Physically prepared to undergo surgery Patient is ready for her surgery. She is scheduled for July 8th.   3. B12 deficiency B12 was 357, started monthly B12 injections today.  - cyanocobalamin ((VITAMIN B-12)) injection 1,000 mcg  4. Vitamin D deficiency Vitamin D level was 13. Weekly vitamin D supplement reordered.  - ergocalciferol (DRISDOL) 1.25 MG (50000 UT) capsule; Take one cap q week  Dispense: 12 capsule; Refill: 0  5. Dysuria Routine urinalysis done.  - UA/M w/rflx Culture, Routine - Microscopic Examination - Urine Culture, Reflex     General Counseling: Queen verbalizes understanding of the findings of todays visit  and agrees with plan of treatment. I have discussed any further diagnostic evaluation that may be needed or ordered today. We also reviewed her medications today. she has been encouraged to call the office with any questions or concerns that should arise related to todays visit.    Orders Placed This Encounter  Procedures   Microscopic Examination   Urine Culture, Reflex   UA/M w/rflx Culture, Routine    Meds ordered this encounter  Medications   cyanocobalamin ((VITAMIN B-12)) injection 1,000 mcg   ergocalciferol (DRISDOL) 1.25 MG (50000 UT) capsule    Sig: Take one cap q week    Dispense:  12 capsule    Refill:  0    Return in about 1 year (around 03/29/2022) for CPE with Lauren PA.   Total time spent:30 Minutes Time spent includes review of chart, medications, test results, and follow up plan with the patient.   [place] Controlled Substance Database was reviewed by me.  This patient was seen by Sallyanne Kuster, FNP-C in collaboration with Dr. Beverely Risen as a part of collaborative care agreement.  Leroy Trim R. Tedd Sias, MSN, FNP-C Internal medicine

## 2021-04-01 LAB — UA/M W/RFLX CULTURE, ROUTINE
Bilirubin, UA: NEGATIVE
Glucose, UA: NEGATIVE
Ketones, UA: NEGATIVE
Leukocytes,UA: NEGATIVE
Nitrite, UA: NEGATIVE
Protein,UA: NEGATIVE
RBC, UA: NEGATIVE
Specific Gravity, UA: 1.025 (ref 1.005–1.030)
Urobilinogen, Ur: 0.2 mg/dL (ref 0.2–1.0)
pH, UA: 5.5 (ref 5.0–7.5)

## 2021-04-01 LAB — MICROSCOPIC EXAMINATION
Casts: NONE SEEN /lpf
Epithelial Cells (non renal): >10 /hpf — AB (ref 0–10)

## 2021-04-01 LAB — URINE CULTURE, REFLEX

## 2021-04-06 ENCOUNTER — Other Ambulatory Visit: Payer: Self-pay

## 2021-04-06 MED ORDER — "LUER LOCK SAFETY SYRINGES 22G X 1"" 3 ML MISC": 3 refills | Status: DC

## 2021-04-06 MED ORDER — CYANOCOBALAMIN 1000 MCG/ML IJ SOLN: INTRAMUSCULAR | 3 refills | Status: DC

## 2021-04-07 MED ORDER — ERGOCALCIFEROL 1.25 MG (50000 UT) PO CAPS
ORAL_CAPSULE | ORAL | 0 refills | Status: DC
Start: 2021-04-07 — End: 2021-11-08

## 2021-04-14 DIAGNOSIS — Z903 Acquired absence of stomach [part of]: Secondary | ICD-10-CM

## 2021-04-14 HISTORY — DX: Acquired absence of stomach (part of): Z90.3

## 2021-04-20 ENCOUNTER — Other Ambulatory Visit: Payer: Self-pay

## 2021-04-20 ENCOUNTER — Emergency Department
Admission: EM | Admit: 2021-04-20 | Discharge: 2021-04-20 | Disposition: A | Discharge status: Home / Self Care | Payer: BC Managed Care – PPO | Attending: Emergency Medicine | Admitting: Emergency Medicine

## 2021-04-20 ENCOUNTER — Encounter: Payer: Self-pay | Admitting: Emergency Medicine

## 2021-04-20 DIAGNOSIS — I10 Essential (primary) hypertension: Secondary | ICD-10-CM | POA: Insufficient documentation

## 2021-04-20 DIAGNOSIS — T50905A Adverse effect of unspecified drugs, medicaments and biological substances, initial encounter: Secondary | ICD-10-CM

## 2021-04-20 DIAGNOSIS — T50995A Adverse effect of other drugs, medicaments and biological substances, initial encounter: Secondary | ICD-10-CM | POA: Insufficient documentation

## 2021-04-20 DIAGNOSIS — R42 Dizziness and giddiness: Secondary | ICD-10-CM | POA: Diagnosis not present

## 2021-04-20 DIAGNOSIS — R001 Bradycardia, unspecified: Secondary | ICD-10-CM | POA: Diagnosis not present

## 2021-04-20 DIAGNOSIS — Z8616 Personal history of COVID-19: Secondary | ICD-10-CM | POA: Insufficient documentation

## 2021-04-20 LAB — CBC WITH DIFFERENTIAL/PLATELET
Abs Immature Granulocytes: 0 10*3/uL (ref 0.00–0.07)
Basophils Absolute: 0 10*3/uL (ref 0.0–0.1)
Basophils Relative: 0 %
Eosinophils Absolute: 0.1 10*3/uL (ref 0.0–0.5)
Eosinophils Relative: 2 %
HCT: 36.8 % (ref 36.0–46.0)
Hemoglobin: 12.2 g/dL (ref 12.0–15.0)
Immature Granulocytes: 0 %
Lymphocytes Relative: 31 %
Lymphs Abs: 1.7 10*3/uL (ref 0.7–4.0)
MCH: 28 pg (ref 26.0–34.0)
MCHC: 33.2 g/dL (ref 30.0–36.0)
MCV: 84.4 fL (ref 80.0–100.0)
Monocytes Absolute: 0.3 10*3/uL (ref 0.1–1.0)
Monocytes Relative: 6 %
Neutro Abs: 3.3 10*3/uL (ref 1.7–7.7)
Neutrophils Relative %: 61 %
Platelets: 209 10*3/uL (ref 150–400)
RBC: 4.36 MIL/uL (ref 3.87–5.11)
RDW: 12.8 % (ref 11.5–15.5)
WBC: 5.4 10*3/uL (ref 4.0–10.5)
nRBC: 0 % (ref 0.0–0.2)

## 2021-04-20 LAB — COMPREHENSIVE METABOLIC PANEL
ALT: 36 U/L (ref 0–44)
AST: 29 U/L (ref 15–41)
Albumin: 4 g/dL (ref 3.5–5.0)
Alkaline Phosphatase: 52 U/L (ref 38–126)
Anion gap: 12 (ref 5–15)
BUN: 9 mg/dL (ref 6–20)
CO2: 23 mmol/L (ref 22–32)
Calcium: 9.2 mg/dL (ref 8.9–10.3)
Chloride: 103 mmol/L (ref 98–111)
Creatinine, Ser: 0.61 mg/dL (ref 0.44–1.00)
GFR, Estimated: >60 mL/min (ref >60)
Glucose, Bld: 71 mg/dL (ref 70–99)
Potassium: 3.7 mmol/L (ref 3.5–5.1)
Sodium: 138 mmol/L (ref 135–145)
Total Bilirubin: 1.2 mg/dL (ref 0.3–1.2)
Total Protein: 7.8 g/dL (ref 6.5–8.1)

## 2021-04-20 LAB — POC URINE PREG, ED: Preg Test, Ur: NEGATIVE

## 2021-04-20 LAB — T4, FREE: Free T4: 1.47 ng/dL — ABNORMAL HIGH (ref 0.61–1.12)

## 2021-04-20 LAB — MAGNESIUM: Magnesium: 2 mg/dL (ref 1.7–2.4)

## 2021-04-20 LAB — TROPONIN I (HIGH SENSITIVITY): Troponin I (High Sensitivity): 6 ng/L (ref <18)

## 2021-04-20 LAB — TSH: TSH: 0.922 u[IU]/mL (ref 0.350–4.500)

## 2021-04-20 NOTE — ED Provider Notes (Signed)
[facility] Emergency Department Provider Note  ____________________________________________   Event Date/Time   First MD Initiated Contact with Patient 04/20/21 1556     (approximate)  I have reviewed the triage vital signs and the nursing notes.   HISTORY  Chief Complaint Dizziness and Tachycardia    HPI Cathy Webster is a 30 y.o. female with IUD status post gastric sleeve on Friday who comes in with dizziness and intermittent blurred vision and low heart rates.  Contrary to triage note is not tachycardia it is bradycardia.  Patient reports that she is not having any chest pain, shortness of breath and nausea.  Her episodes are intermittent, unclear what brings it on, better on its own.  She denies any symptoms at this time.  She is noted that her heart rates have been in the 50s at home.  Patient does report that she has been using a scopolamine patch recently.  Denies being on this previously          Past Medical History:  Diagnosis Date   Abnormal Pap smear 2008   Allergy    COVID-19 10/20/2019   Diagnosed on 10/20/2019   Cystitis 04/09/16   Hives    Hypertension    Preeclampsia    UTI (lower urinary tract infection)     Patient Active Problem List   Diagnosis Date Noted   IUD check up 09/15/2019   Ectopic pregnancy 09/26/2018   Contraceptive management 04/09/2016   Preeclampsia in postpartum period 03/01/2016   Abnormal Pap smear of cervix 12/22/2013    Past Surgical History:  Procedure Laterality Date   CESAREAN SECTION     LAPAROSCOPIC GASTRIC SLEEVE RESECTION     LAPAROSCOPY N/A 09/26/2018   Procedure: LAPAROSCOPY OPERATIVE WITH LEFT SALPINGECTOMY;  Surgeon: Reva Bores, MD;  Location: [facility];  Service: Gynecology;  Laterality: N/A;   TONSILLECTOMY      Prior to Admission medications   Medication Sig Start Date End Date Taking? Authorizing Provider  cetirizine (ZYRTEC ALLERGY) 10 MG tablet Take 1 tablet (10 mg total)  by mouth daily. 11/02/20   Wallis Bamberg, PA-C  cyanocobalamin (,VITAMIN B-12,) 1000 MCG/ML injection Use as directed  once a month 04/06/21   Sallyanne Kuster, NP  dicyclomine (BENTYL) 20 MG tablet Take 1 tablet (20 mg total) by mouth 3 (three) times daily before meals. 03/07/21   Midge Minium, MD  ergocalciferol (DRISDOL) 1.25 MG (50000 UT) capsule Take one cap q week 04/07/21   Sallyanne Kuster, NP  hydrOXYzine (ATARAX/VISTARIL) 50 MG tablet Take 1 tablet (50 mg total) by mouth 3 (three) times daily as needed for itching. 10/22/19   Joni Reining, PA-C  methocarbamol (ROBAXIN) 500 MG tablet Take 1 tablet (500 mg total) by mouth 2 (two) times daily. 01/03/21   Lorre Nick, MD  SYRINGE-NEEDLE, DISP, 3 ML (LUER LOCK SAFETY SYRINGES) 22G X 1" 3 ML MISC Use as directed with B12 injection 04/06/21   Abernathy, Arlyss Repress, NP  fluticasone (FLONASE) 50 MCG/ACT nasal spray Place 2 sprays into both nostrils daily. 08/19/19 10/22/19  Menshew, Charlesetta Ivory, PA-C    Allergies Patient has no known allergies.  Family History  Problem Relation Age of Onset   Diabetes Mother    Hypertension Mother    Asthma Brother     Social History Social History   Tobacco Use   Smoking status: Never   Smokeless tobacco: Never  Vaping Use   Vaping Use: Never used  Substance Use Topics  Alcohol use: No   Drug use: No      Review of Systems Constitutional: No fever/chills dizzy Eyes: Blurred vision ENT: No sore throat. Cardiovascular: Denies chest pain.  Low heart rate Respiratory: Denies shortness of breath. Gastrointestinal: No abdominal pain.  No nausea, no vomiting.  No diarrhea.  No constipation. Genitourinary: Negative for dysuria. Musculoskeletal: Negative for back pain. Skin: Negative for rash. Neurological: Negative for headaches, focal weakness or numbness. All other ROS negative ____________________________________________   PHYSICAL EXAM:  VITAL SIGNS: ED Triage Vitals  Enc Vitals Group      BP 04/20/21 1602 137/65     Pulse Rate 04/20/21 1602 (!) 54     Resp 04/20/21 1602 16     Temp --      Temp src --      SpO2 04/20/21 1602 96 %     Weight 04/20/21 1243 287 lb (130.2 kg)     Height 04/20/21 1243 5\' 5"  (1.651 m)     Head Circumference --      Peak Flow --      Pain Score 04/20/21 1243 0     Pain Loc --      Pain Edu? --      Excl. in GC? --     Constitutional: Alert and oriented. Well appearing and in no acute distress. Eyes: Conjunctivae are normal. EOMI. pupils equal and reactive Head: Atraumatic. Nose: No congestion/rhinnorhea. Mouth/Throat: Mucous membranes are moist.   Neck: No stridor. Trachea Midline. FROM Cardiovascular: Sinus bradycardia regular rhythm. Grossly normal heart sounds.  Good peripheral circulation. Respiratory: Normal respiratory effort.  No retractions. Lungs CTAB. Gastrointestinal: Soft and nontender. No distention. No abdominal bruits.  Musculoskeletal: No lower extremity tenderness nor edema.  No joint effusions. Neurologic:  Normal speech and language. No gross focal neurologic deficits are appreciated.  Skin:  Skin is warm, dry and intact. No rash noted. Psychiatric: Mood and affect are normal. Speech and behavior are normal. GU: Deferred   ____________________________________________   LABS (all labs ordered are listed, but only abnormal results are displayed)  Labs Reviewed  T4, FREE - Abnormal; Notable for the following components:      Result Value   Free T4 1.47 (*)    All other components within normal limits  CBC WITH DIFFERENTIAL/PLATELET  COMPREHENSIVE METABOLIC PANEL  MAGNESIUM  TSH  POC URINE PREG, ED  TROPONIN I (HIGH SENSITIVITY)  TROPONIN I (HIGH SENSITIVITY)   ____________________________________________   ED ECG REPORT I, 04/22/21, the attending physician, personally viewed and interpreted this ECG.  Normal sinus rate of 64, no ST elevation, no T wave versions, normal intervals  Repeat EKG is  sinus bradycardia rate of 55, no ST elevation, no T wave versions, normal intervals ____________________________________________    PROCEDURES  Procedure(s) performed (including Critical Care):  Procedures   ____________________________________________   INITIAL IMPRESSION / ASSESSMENT AND PLAN / ED COURSE  Cathy Webster was evaluated in Emergency Department on 04/20/2021 for the symptoms described in the history of present illness. She was evaluated in the context of the global COVID-19 pandemic, which necessitated consideration that the patient might be at risk for infection with the SARS-CoV-2 virus that causes COVID-19. Institutional protocols and algorithms that pertain to the evaluation of patients at risk for COVID-19 are in a state of rapid change based on information released by regulatory bodies including the CDC and federal and state organizations. These policies and algorithms were followed during the patient's care in the  ED.     Patient is a well-appearing 30 year old who comes in with some dizziness and low heart rates.  Patient denies any symptoms at this time however.  Labs ordered to evaluate for cardiac pathology, electrolyte abnormalities, AKI.  I suspect that this is from patient's scopolamine patch given her symptoms.  Patient has removed the patch and denies any current symptoms.  Labs are reassuring.  Cardiac markers negative and symptoms been going on for greater than 3 hours.  No signs of electrolyte abnormalities.  On reassessment patient remains asymptomatic.  Her heart rates remain in the 50s and they had a repeat EKG that remained sinus brady.  Discussed with patient that given she is asymptomatic that this can just be monitored at this time given its sinus.  Recommended that she stop using scopolamine patches and return to the ER for worsening symptoms or any other concerns  I discussed the provisional nature of ED diagnosis, the treatment so far, the  ongoing plan of care, follow up appointments and return precautions with the patient and any family or support people present. They expressed understanding and agreed with the plan, discharged home.         ____________________________________________   FINAL CLINICAL IMPRESSION(S) / ED DIAGNOSES   Final diagnoses:  Adverse effect of drug, initial encounter  Sinus bradycardia      MEDICATIONS GIVEN DURING THIS VISIT:  Medications - No data to display   ED Discharge Orders     None        Note:  This document was prepared using Dragon voice recognition software and may include unintentional dictation errors.    Concha Se, MD 04/20/21 [phone]

## 2021-04-20 NOTE — Discharge Instructions (Addendum)
I suspect that this could be from your scopolamine patch.  I would stop using them.  Your heart rate is low but it is in a normal rhythm and your blood work was reassuring so return to the ER if you develop worsening symptoms or any other concern.  I

## 2021-04-20 NOTE — ED Provider Notes (Addendum)
HPI: Pt is a 30 y.o. female who presents with complaints of blurred vision   The patient p/w  low heart rate s/p sleeve procedure now with low HR and blurred vision. No blurred vision now   ROS: Denies fever, chest pain, vomiting  Past Medical History:  Diagnosis Date   Abnormal Pap smear 2008   Allergy    COVID-19 10/20/2019   Diagnosed on 10/20/2019   Cystitis 04/09/16   Hives    Hypertension    Preeclampsia    UTI (lower urinary tract infection)    There were no vitals filed for this visit.  Focused Physical Exam: Gen: No acute distress Head: atraumatic, normocephalic Eyes: Extraocular movements grossly intact; conjunctiva clear CV: RRR Lung: No increased WOB, no stridor GI: ND, no obvious masses Neuro: Alert and awake  Medical Decision Making and Plan: Given the patient's initial medical screening exam, the following diagnostic evaluation has been ordered. The patient will be placed in the appropriate treatment space, once one is available, to complete the evaluation and treatment. I have discussed the plan of care with the patient and I have advised the patient that an ED physician or mid-level practitioner will reevaluate their condition after the test results have been received, as the results may give them additional insight into the type of treatment they may need.   Diagnostics: labs/ekg   Treatments: none immediately   Concha Se, MD 04/20/21 1240    Concha Se, MD 04/20/21 1243

## 2021-04-20 NOTE — ED Triage Notes (Signed)
Pt comes into the ED via POV c/o dizziness and tachycardia.  Pt recently underwent gastric sleeve on Friday.  Pt states she was laying down when this started.  Pt denies any chest pain, SHOB, and nausea.  Pt currently neurologically intact and in NAD with even and unlabored respirations.

## 2021-04-20 NOTE — ED Notes (Signed)
PER FUNKE,MD, NO 2nd TROPONIN NEEDED. 2nd EKG OBTAINED.

## 2021-11-08 ENCOUNTER — Other Ambulatory Visit: Payer: Self-pay

## 2021-11-08 ENCOUNTER — Ambulatory Visit (INDEPENDENT_AMBULATORY_CARE_PROVIDER_SITE_OTHER): Payer: BC Managed Care – PPO | Admitting: Nurse Practitioner

## 2021-11-08 ENCOUNTER — Encounter: Payer: Self-pay | Admitting: Nurse Practitioner

## 2021-11-08 VITALS — BP 126/76 | HR 70 | Temp 98.4°F | Resp 16 | Ht 66.0 in | Wt 228.0 lb

## 2021-11-08 DIAGNOSIS — H538 Other visual disturbances: Secondary | ICD-10-CM | POA: Diagnosis not present

## 2021-11-08 DIAGNOSIS — R42 Dizziness and giddiness: Secondary | ICD-10-CM

## 2021-11-08 DIAGNOSIS — R7301 Impaired fasting glucose: Secondary | ICD-10-CM

## 2021-11-08 DIAGNOSIS — E538 Deficiency of other specified B group vitamins: Secondary | ICD-10-CM | POA: Diagnosis not present

## 2021-11-08 DIAGNOSIS — E559 Vitamin D deficiency, unspecified: Secondary | ICD-10-CM | POA: Diagnosis not present

## 2021-11-08 DIAGNOSIS — R7989 Other specified abnormal findings of blood chemistry: Secondary | ICD-10-CM

## 2021-11-08 LAB — POCT GLYCOSYLATED HEMOGLOBIN (HGB A1C): Hemoglobin A1C: 5 % (ref 4.0–5.6)

## 2021-11-08 MED ORDER — MECLIZINE HCL 25 MG PO TABS: 25.0000 mg | ORAL_TABLET | Freq: Three times a day (TID) | ORAL | 0 refills | Status: DC | PRN

## 2021-11-08 NOTE — Progress Notes (Signed)
[facility], [place] [address]  Internal MEDICINE  Office Visit Note  Patient Name: Cathy Webster  [id number]  [id number]  Date of Service: 11/08/2021  Chief Complaint  Patient presents with   Acute Visit    Started yesterday    Dizziness   Blurred Vision     HPI Cathy Webster presents for an acute sick visit for dizziness and blurred vision. This started yesterday and she has episodes of dizziness at random times that do not seem related. She has had these symptoms while driving, and while sitting still in a chair at home. She denies any nausea, vomiting, fainting, chest pain, difficulty breathing or anxiety. She had gastric sleeve surgery last year and has lost a significant amount of weight. She has a family history of diabetes and has had elevated fasting glucose levels but her A1C was normal when checked today.       Current Medication:  Outpatient Encounter Medications as of 11/08/2021  Medication Sig   cetirizine (ZYRTEC ALLERGY) 10 MG tablet Take 1 tablet (10 mg total) by mouth daily.   cyanocobalamin (,VITAMIN B-12,) 1000 MCG/ML injection Use as directed  once a month   dicyclomine (BENTYL) 20 MG tablet Take 1 tablet (20 mg total) by mouth 3 (three) times daily before meals.   hydrOXYzine (ATARAX/VISTARIL) 50 MG tablet Take 1 tablet (50 mg total) by mouth 3 (three) times daily as needed for itching.   meclizine (ANTIVERT) 25 MG tablet Take 1 tablet (25 mg total) by mouth 3 (three) times daily as needed for dizziness.   methocarbamol (ROBAXIN) 500 MG tablet Take 1 tablet (500 mg total) by mouth 2 (two) times daily.   SYRINGE-NEEDLE, DISP, 3 ML (LUER LOCK SAFETY SYRINGES) 22G X 1" 3 ML MISC Use as directed with B12 injection   [DISCONTINUED] ergocalciferol (DRISDOL) 1.25 MG (50000 UT) capsule Take one cap q week (Patient not taking: Reported on 11/08/2021)   [DISCONTINUED] fluticasone (FLONASE) 50 MCG/ACT nasal spray Place 2 sprays into both  nostrils daily.   No facility-administered encounter medications on file as of 11/08/2021.      Medical History: Past Medical History:  Diagnosis Date   Abnormal Pap smear 2008   Allergy    COVID-19 10/20/2019   Diagnosed on 10/20/2019   Cystitis 04/09/2016   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   Hives    Hypertension    Preeclampsia    UTI (lower urinary tract infection)      Vital Signs: BP 126/76    Pulse 70    Temp 98.4 F (36.9 C)    Resp 16    Ht 5' 6"  (1.676 m)    Wt 228 lb (103.4 kg)    SpO2 99%    BMI 36.80 kg/m    Review of Systems  Constitutional:  Negative for chills, fatigue and unexpected weight change.  HENT:  Negative for congestion, rhinorrhea, sneezing and sore throat.   Eyes:  Positive for visual disturbance. Negative for redness.  Respiratory:  Negative for cough, chest tightness and shortness of breath.   Cardiovascular:  Negative for chest pain and palpitations.  Gastrointestinal:  Negative for abdominal pain, constipation, diarrhea, nausea and vomiting.  Genitourinary:  Negative for dysuria and frequency.  Musculoskeletal:  Negative for arthralgias, back pain, joint swelling and neck pain.  Skin:  Negative for rash.  Neurological:  Positive for dizziness and light-headedness. Negative for tremors and numbness.  Hematological:  Negative for adenopathy. Does not bruise/bleed easily.  Psychiatric/Behavioral:  Negative for behavioral problems (Depression), sleep disturbance and suicidal ideas. The patient is not nervous/anxious.    Physical Exam Vitals reviewed.  Constitutional:      General: She is not in acute distress.    Appearance: Normal appearance. She is obese. She is not ill-appearing.  HENT:     Head: Normocephalic and atraumatic.  Eyes:     Extraocular Movements: Extraocular movements intact.     Pupils: Pupils are equal, round, and reactive to light.  Cardiovascular:     Rate and Rhythm: Normal rate and regular rhythm.  Pulmonary:      Effort: Pulmonary effort is normal. No respiratory distress.  Neurological:     Mental Status: She is alert and oriented to person, place, and time.     Cranial Nerves: No cranial nerve deficit.     Coordination: Coordination normal.     Gait: Gait normal.  Psychiatric:        Mood and Affect: Mood normal.        Behavior: Behavior normal.      Assessment/Plan: 1. Dizziness Rule out thyroid problem, electrolyte imbalance, low vitamin D, low B12 or folate, anemia and diabetes. Meclizine prescribed to take as needed for dizziness.  - TSH + free T4 - CMP14+EGFR - Vitamin D (25 hydroxy) - B12 and Folate Panel - CBC with Differential/Platelet - Iron, TIBC and Ferritin Panel - POCT glycosylated hemoglobin (Hb A1C) - meclizine (ANTIVERT) 25 MG tablet; Take 1 tablet (25 mg total) by mouth 3 (three) times daily as needed for dizziness.  Dispense: 90 tablet; Refill: 0  2. Blurry vision Persistent blurry vision with and without dizziness, refer to ophthalmology.  - Ambulatory referral to Ophthalmology  3. B12 deficiency Labs ordered - TSH + free T4 - B12 and Folate Panel  4. Vitamin D deficiency Labs ordered - TSH + free T4 - Vitamin D (25 hydroxy)  5. Elevated serum free T4 level Lab ordered - TSH + free T4  6. Impaired fasting glucose A1C checks and it was normal.  - POCT glycosylated hemoglobin (Hb A1C)   General Counseling: Cathy Webster verbalizes understanding of the findings of todays visit and agrees with plan of treatment. I have discussed any further diagnostic evaluation that may be needed or ordered today. We also reviewed her medications today. she has been encouraged to call the office with any questions or concerns that should arise related to todays visit.    Counseling:    Orders Placed This Encounter  Procedures   TSH + free T4   CMP14+EGFR   Vitamin D (25 hydroxy)   B12 and Folate Panel   CBC with Differential/Platelet   Iron, TIBC and Ferritin Panel    Ambulatory referral to Ophthalmology   POCT glycosylated hemoglobin (Hb A1C)    Meds ordered this encounter  Medications   meclizine (ANTIVERT) 25 MG tablet    Sig: Take 1 tablet (25 mg total) by mouth 3 (three) times daily as needed for dizziness.    Dispense:  90 tablet    Refill:  0    Return if symptoms worsen or fail to improve, for F/U, Labs, Annalisa Colonna PCP.  [place] Controlled Substance Database was reviewed by me for overdose risk score (ORS)  Time spent:30 Minutes Time spent with patient included reviewing progress notes, labs, imaging studies, and discussing plan for follow up.   This patient was seen by Jonetta Osgood, FNP-C in collaboration with Dr. Clayborn Bigness as a part of collaborative care  agreement.  Oron Westrup R. Valetta Fuller, MSN, FNP-C Internal Medicine

## 2021-11-09 ENCOUNTER — Telehealth: Payer: Self-pay

## 2021-11-09 LAB — CMP14+EGFR
ALT: 9 IU/L (ref 0–32)
AST: 13 IU/L (ref 0–40)
Albumin/Globulin Ratio: 1.4 (ref 1.2–2.2)
Albumin: 4.3 g/dL (ref 3.8–4.8)
Alkaline Phosphatase: 59 IU/L (ref 44–121)
BUN/Creatinine Ratio: 18 (ref 9–23)
BUN: 12 mg/dL (ref 6–20)
Bilirubin Total: 0.3 mg/dL (ref 0.0–1.2)
CO2: 21 mmol/L (ref 20–29)
Calcium: 9.3 mg/dL (ref 8.7–10.2)
Chloride: 102 mmol/L (ref 96–106)
Creatinine, Ser: 0.65 mg/dL (ref 0.57–1.00)
Globulin, Total: 3 g/dL (ref 1.5–4.5)
Glucose: 71 mg/dL (ref 70–99)
Potassium: 4.2 mmol/L (ref 3.5–5.2)
Sodium: 139 mmol/L (ref 134–144)
Total Protein: 7.3 g/dL (ref 6.0–8.5)
eGFR: 121 mL/min/{1.73_m2} (ref >59)

## 2021-11-09 LAB — IRON,TIBC AND FERRITIN PANEL
Ferritin: 121 ng/mL (ref 15–150)
Iron Saturation: 17 % (ref 15–55)
Iron: 53 ug/dL (ref 27–159)
Total Iron Binding Capacity: 303 ug/dL (ref 250–450)
UIBC: 250 ug/dL (ref 131–425)

## 2021-11-09 LAB — CBC WITH DIFFERENTIAL/PLATELET
Basophils Absolute: 0 10*3/uL (ref 0.0–0.2)
Basos: 1 %
EOS (ABSOLUTE): 0.2 10*3/uL (ref 0.0–0.4)
Eos: 3 %
Hematocrit: 36.6 % (ref 34.0–46.6)
Hemoglobin: 12 g/dL (ref 11.1–15.9)
Immature Grans (Abs): 0 10*3/uL (ref 0.0–0.1)
Immature Granulocytes: 0 %
Lymphocytes Absolute: 2.5 10*3/uL (ref 0.7–3.1)
Lymphs: 47 %
MCH: 28.5 pg (ref 26.6–33.0)
MCHC: 32.8 g/dL (ref 31.5–35.7)
MCV: 87 fL (ref 79–97)
Monocytes Absolute: 0.4 10*3/uL (ref 0.1–0.9)
Monocytes: 7 %
Neutrophils Absolute: 2.2 10*3/uL (ref 1.4–7.0)
Neutrophils: 42 %
Platelets: 217 10*3/uL (ref 150–450)
RBC: 4.21 x10E6/uL (ref 3.77–5.28)
RDW: 13.2 % (ref 11.7–15.4)
WBC: 5.3 10*3/uL (ref 3.4–10.8)

## 2021-11-09 LAB — TSH+FREE T4
Free T4: 1.16 ng/dL (ref 0.82–1.77)
TSH: 0.836 u[IU]/mL (ref 0.450–4.500)

## 2021-11-09 LAB — B12 AND FOLATE PANEL
Folate: 6.1 ng/mL (ref >3.0)
Vitamin B-12: 635 pg/mL (ref 232–1245)

## 2021-11-09 LAB — VITAMIN D 25 HYDROXY (VIT D DEFICIENCY, FRACTURES): Vit D, 25-Hydroxy: 25.8 ng/mL — ABNORMAL LOW (ref 30.0–100.0)

## 2021-11-09 NOTE — Telephone Encounter (Signed)
Error

## 2021-11-10 ENCOUNTER — Telehealth: Payer: Self-pay

## 2021-11-10 NOTE — Progress Notes (Signed)
Please let patient know her lab results:  -vitamin D level is slightly low, would be beneficial to take an OTC supplement vitamin D 5000 units daily.  -thyroid levels are normal -B12 and folate are normal -CBC, metabolic panel and iron panel are normal.

## 2021-11-10 NOTE — Telephone Encounter (Signed)
Can you send referral for ophthalmology for blurry vision

## 2021-11-10 NOTE — Telephone Encounter (Signed)
Awaiting 11/09/21 office notes for ophthalmology referral-Cathy Webster

## 2021-11-10 NOTE — Telephone Encounter (Signed)
-----   Message from Sallyanne Kuster, NP sent at 11/10/2021  6:29 AM EST ----- Please let patient know her lab results:  -vitamin D level is slightly low, would be beneficial to take an OTC supplement vitamin D 5000 units daily.  -thyroid levels are normal -B12 and folate are normal -CBC, metabolic panel and iron panel are normal.

## 2021-11-10 NOTE — Telephone Encounter (Signed)
Pt notified for labs result advised her to take OTC vitamin D 5000 IU daily and we recheck in 3 months also send message to alyssa that pt need referral for opthalmology for blurry vision

## 2021-11-13 ENCOUNTER — Encounter: Payer: Self-pay | Admitting: Nurse Practitioner

## 2021-11-13 NOTE — Telephone Encounter (Signed)
Referral sent via [facility] as urgent-Toni

## 2022-01-08 ENCOUNTER — Telehealth: Payer: Self-pay

## 2022-01-08 NOTE — Telephone Encounter (Signed)
Per [facility], referral closed due to patient not returning calls to schedule appointment-Toni ?

## 2022-02-22 ENCOUNTER — Encounter: Payer: Self-pay | Admitting: Physician Assistant

## 2022-02-22 ENCOUNTER — Ambulatory Visit
Admission: RE | Admit: 2022-02-22 | Discharge: 2022-02-22 | Disposition: A | Discharge status: Home / Self Care | Payer: BC Managed Care – PPO | Attending: Physician Assistant | Admitting: Physician Assistant

## 2022-02-22 ENCOUNTER — Ambulatory Visit
Admission: EM | Admit: 2022-02-22 | Discharge: 2022-02-22 | Discharge status: Against Medical Advice | Payer: BC Managed Care – PPO

## 2022-02-22 ENCOUNTER — Ambulatory Visit (INDEPENDENT_AMBULATORY_CARE_PROVIDER_SITE_OTHER): Payer: BC Managed Care – PPO | Admitting: Physician Assistant

## 2022-02-22 ENCOUNTER — Ambulatory Visit
Admission: RE | Admit: 2022-02-22 | Discharge: 2022-02-22 | Disposition: A | Discharge status: Home / Self Care | Payer: BC Managed Care – PPO | Source: Ambulatory Visit | Attending: Physician Assistant | Admitting: Physician Assistant

## 2022-02-22 VITALS — BP 127/85 | HR 90 | Temp 98.7°F | Resp 16 | Ht 66.0 in | Wt 213.6 lb

## 2022-02-22 DIAGNOSIS — M25571 Pain in right ankle and joints of right foot: Secondary | ICD-10-CM | POA: Insufficient documentation

## 2022-02-22 MED ORDER — MELOXICAM 15 MG PO TABS: 15.0000 mg | ORAL_TABLET | Freq: Every day | ORAL | 0 refills | Status: AC

## 2022-02-22 NOTE — Progress Notes (Signed)
[facility] [address] [place], [place] [address]  Internal MEDICINE  Office Visit Note  Patient Name: Cathy Webster  [id number]  [id number]  Date of Service: 02/28/2022  Chief Complaint  Patient presents with   Ankle Injury    Right ankle sprain - did not fall or twist ankle     HPI Pt is here for a sick visit. -woke up and went to bathroom and while she was trying to get ready she found her ankle was really bothering her -Not aware of any injury -Sunday she did notice some popping in her foot and twisting but didn't hurt at all -Right before coming in here she felt another pop and started to feel a little better.  -Hurts to put weight through it and at full ROM  Current Medication:  Outpatient Encounter Medications as of 02/22/2022  Medication Sig   cetirizine (ZYRTEC ALLERGY) 10 MG tablet Take 1 tablet (10 mg total) by mouth daily.   cyanocobalamin (,VITAMIN B-12,) 1000 MCG/ML injection Use as directed  once a month   dicyclomine (BENTYL) 20 MG tablet Take 1 tablet (20 mg total) by mouth 3 (three) times daily before meals.   hydrOXYzine (ATARAX/VISTARIL) 50 MG tablet Take 1 tablet (50 mg total) by mouth 3 (three) times daily as needed for itching.   meclizine (ANTIVERT) 25 MG tablet Take 1 tablet (25 mg total) by mouth 3 (three) times daily as needed for dizziness.   meloxicam (MOBIC) 15 MG tablet Take 1 tablet (15 mg total) by mouth daily.   methocarbamol (ROBAXIN) 500 MG tablet Take 1 tablet (500 mg total) by mouth 2 (two) times daily.   SYRINGE-NEEDLE, DISP, 3 ML (LUER LOCK SAFETY SYRINGES) 22G X 1" 3 ML MISC Use as directed with B12 injection   [DISCONTINUED] fluticasone (FLONASE) 50 MCG/ACT nasal spray Place 2 sprays into both nostrils daily.   No facility-administered encounter medications on file as of 02/22/2022.      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  HENT:  Negative for congestion, mouth sores and postnasal drip.   Respiratory:  Negative for cough.   Cardiovascular:  Negative for chest pain.  Genitourinary:  Negative for flank pain.  Musculoskeletal:  Positive for arthralgias and gait problem.  Psychiatric/Behavioral: Negative.     Physical Exam Vitals and nursing note reviewed.  Constitutional:      General: She is not in acute distress.    Appearance: Normal appearance. She is obese. She is not ill-appearing.  HENT:     Head: Normocephalic and atraumatic.  Eyes:     Extraocular Movements: Extraocular movements intact.     Pupils: Pupils are equal, round, and reactive to light.  Cardiovascular:     Rate and Rhythm: Normal rate and regular rhythm.  Pulmonary:     Effort: Pulmonary effort is normal.  No respiratory distress.  Musculoskeletal:        General: Tenderness present. No swelling or deformity.     Comments: Mild tenderness along back of right ankle, pain with full ROM and against resistance  Skin:    Findings: No rash.  Neurological:     Mental Status: She is alert and oriented to person, place, and time.     Cranial Nerves: No cranial nerve deficit.     Coordination: Coordination normal.     Gait: Gait normal.  Psychiatric:        Mood and Affect: Mood normal.        Behavior: Behavior normal.      Assessment/Plan: 1. Acute right ankle pain Will obtain xray to rule out fracture or dislocation. Likely tendonitis or possible sprain though no known inciting injury. Will start mobic and advised to rest, ice and elevate  it. Patient will follow up with ortho as needed if not improving - meloxicam (MOBIC) 15 MG tablet; Take 1 tablet (15 mg total) by mouth daily.  Dispense: 30 tablet; Refill: 0 - DG Ankle Complete Right; Future   General Counseling: Ravenne verbalizes understanding of the findings of todays visit and agrees with plan of treatment. I have discussed any further diagnostic evaluation that may be needed or ordered today. We also reviewed her medications today. she has been encouraged to call the office with any questions or concerns that should arise related to todays visit.    Counseling:    Orders Placed This Encounter  Procedures   DG Ankle Complete Right    Meds ordered this encounter  Medications   meloxicam (MOBIC) 15 MG tablet    Sig: Take 1 tablet (15 mg total) by mouth daily.    Dispense:  30 tablet    Refill:  0    Time spent:25 Minutes

## 2022-02-26 ENCOUNTER — Telehealth: Payer: Self-pay

## 2022-02-26 NOTE — Telephone Encounter (Signed)
-----   Message from Carlean Jews, PA-C sent at 02/23/2022  3:16 PM EDT ----- Please let her know that her xray did not show any acute findings. She does have a bone spur on her heel, but otherwise normal

## 2022-02-26 NOTE — Telephone Encounter (Signed)
Spoke with patient regarding ankle imaging results on 02/26/2022.

## 2022-04-02 ENCOUNTER — Encounter: Payer: BC Managed Care – PPO | Admitting: Nurse Practitioner

## 2022-04-30 ENCOUNTER — Encounter: Payer: Self-pay | Admitting: Nurse Practitioner

## 2022-04-30 ENCOUNTER — Ambulatory Visit (INDEPENDENT_AMBULATORY_CARE_PROVIDER_SITE_OTHER): Payer: BC Managed Care – PPO | Admitting: Nurse Practitioner

## 2022-04-30 VITALS — BP 120/70 | HR 81 | Temp 97.8°F | Resp 16 | Ht 66.0 in | Wt 213.0 lb

## 2022-04-30 DIAGNOSIS — R300 Dysuria: Secondary | ICD-10-CM | POA: Diagnosis not present

## 2022-04-30 DIAGNOSIS — Z0001 Encounter for general adult medical examination with abnormal findings: Secondary | ICD-10-CM

## 2022-04-30 DIAGNOSIS — Z9884 Bariatric surgery status: Secondary | ICD-10-CM

## 2022-04-30 DIAGNOSIS — R42 Dizziness and giddiness: Secondary | ICD-10-CM

## 2022-04-30 DIAGNOSIS — E559 Vitamin D deficiency, unspecified: Secondary | ICD-10-CM | POA: Diagnosis not present

## 2022-04-30 DIAGNOSIS — E538 Deficiency of other specified B group vitamins: Secondary | ICD-10-CM

## 2022-04-30 MED ORDER — CETIRIZINE HCL 10 MG PO TABS: 10.0000 mg | ORAL_TABLET | Freq: Every day | ORAL | 3 refills | Status: DC

## 2022-04-30 NOTE — Progress Notes (Signed)
[facility] [address] [place], [place] [address]  Internal MEDICINE  Office Visit Note  Patient Name: Cathy Webster  [id number]  [id number]  Date of Service: 04/30/2022  Chief Complaint  Patient presents with   Annual Exam    HPI Sabina presents for an annual well visit and physical exam.  -- Well-appearing 31 year old female with no significant chronic medical conditions.  She did have gastric bypass surgery last year and has been doing well with her weight loss.  Her only other significant surgical history is an ectopic pregnancy with removal of the left fallopian tube. -- Her blood pressure and vital signs are within normal limits. -- She had her routine labs drawn earlier in the year during February and all of her labs were normal except for a slightly low vitamin D level of 25.8 and she does take a supplement for that. -- She sees OB/GYN for women's health and is scheduled for her well woman exam with Pap smear on July 31 at the end of this month.  She has no other preventive screenings due at this time. -- She has lost 15 pounds since February this year. -- The only medication refill she needs today is cetirizine -- She has no other concerns and denies any new or worsening pain.   Current Medication: Outpatient Encounter Medications as of 04/30/2022  Medication Sig   cyanocobalamin (,VITAMIN B-12,) 1000 MCG/ML injection Use as directed  once a month   dicyclomine (BENTYL) 20 MG tablet Take 1 tablet (20 mg total) by mouth 3 (three) times daily before meals.   hydrOXYzine (ATARAX/VISTARIL) 50 MG tablet Take 1 tablet (50 mg total) by mouth 3 (three) times daily as needed for itching.   meclizine (ANTIVERT) 25 MG tablet Take 1 tablet (25 mg total) by mouth 3 (three) times daily as needed for dizziness.   meloxicam (MOBIC) 15 MG tablet Take 1 tablet (15 mg total) by mouth daily.   SYRINGE-NEEDLE, DISP, 3 ML (LUER LOCK SAFETY SYRINGES) 22G X 1" 3 ML MISC Use as  directed with B12 injection   [DISCONTINUED] cetirizine (ZYRTEC ALLERGY) 10 MG tablet Take 1 tablet (10 mg total) by mouth daily.   cetirizine (ZYRTEC ALLERGY) 10 MG tablet Take 1 tablet (10 mg total) by mouth daily.   [DISCONTINUED] fluticasone (FLONASE) 50 MCG/ACT nasal spray Place 2 sprays into both nostrils daily.   [DISCONTINUED] methocarbamol (ROBAXIN) 500 MG tablet Take 1 tablet (500 mg total) by mouth 2 (two) times daily. (Patient not taking: Reported on 04/30/2022)   No facility-administered encounter medications on file as of 04/30/2022.    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   Hives    Hypertension    Preeclampsia    UTI (lower urinary tract infection)     Family History: Family History  Problem Relation Age of Onset   Diabetes Mother    Hypertension Mother  Asthma Brother     Social History   Socioeconomic History   Marital status: Single    Spouse name: Not on file   Number of children: Not on file   Years of education: Not on file   Highest education level: Not on file  Occupational History   Not on file  Tobacco Use   Smoking status: Never   Smokeless tobacco: Never  Vaping Use   Vaping Use: Never used  Substance and Sexual Activity   Alcohol use: No   Drug use: No   Sexual activity: Yes    Birth control/protection: I.U.D.    Comment: Liletta  Other Topics Concern   Not on file  Social  History Narrative   Not on file   Social Determinants of Health   Financial Resource Strain: Not on file  Food Insecurity: No Food Insecurity (03/03/2020)   Hunger Vital Sign    Worried About Running Out of Food in the Last Year: Never true    Ran Out of Food in the Last Year: Never true  Transportation Needs: No Transportation Needs (03/03/2020)   PRAPARE - Administrator, Civil Service (Medical): No    Lack of Transportation (Non-Medical): No  Physical Activity: Not on file  Stress: Not on file  Social Connections: Not on file  Intimate Partner Violence: Not on file      Review of Systems  Constitutional:  Negative for activity change, appetite change, chills, fatigue, fever and unexpected weight change.  HENT: Negative.  Negative for congestion, ear pain, rhinorrhea, sore throat and trouble swallowing.   Eyes: Negative.   Respiratory: Negative.  Negative for cough, chest tightness, shortness of breath and wheezing.   Cardiovascular: Negative.  Negative for chest pain.  Gastrointestinal: Negative.  Negative for abdominal pain, blood in stool, constipation, diarrhea, nausea and vomiting.  Endocrine: Negative.   Genitourinary: Negative.  Negative for difficulty urinating, dysuria, frequency, hematuria and urgency.  Musculoskeletal: Negative.  Negative for arthralgias, back pain, joint swelling, myalgias and neck pain.  Skin: Negative.  Negative for rash and wound.  Allergic/Immunologic: Negative.  Negative for immunocompromised state.  Neurological: Negative.  Negative for dizziness, seizures, numbness and headaches.  Hematological: Negative.   Psychiatric/Behavioral: Negative.  Negative for behavioral problems, self-injury and suicidal ideas. The patient is not nervous/anxious.     Vital Signs: BP 120/70   Pulse 81   Temp 97.8 F (36.6 C)   Resp 16   Ht 5\' 6"  (1.676 m)   Wt 213 lb (96.6 kg)   SpO2 100%   BMI 34.38 kg/m    Physical Exam Vitals reviewed.   Constitutional:      General: She is awake. She is not in acute distress.    Appearance: Normal appearance. She is well-developed and well-groomed. She is obese. She is not ill-appearing or diaphoretic.  HENT:     Head: Normocephalic and atraumatic.     Right Ear: Tympanic membrane, ear canal and external ear normal.     Left Ear: Tympanic membrane, ear canal and external ear normal.     Nose: Nose normal. No congestion or rhinorrhea.     Mouth/Throat:     Lips: Pink.     Mouth: Mucous membranes are moist.     Pharynx: Oropharynx is clear. Uvula midline. No oropharyngeal exudate or posterior oropharyngeal erythema.  Eyes:     General: Lids are normal. Vision grossly intact. Gaze aligned appropriately. No scleral icterus.  Right eye: No discharge.        Left eye: No discharge.     Extraocular Movements: Extraocular movements intact.     Conjunctiva/sclera: Conjunctivae normal.     Pupils: Pupils are equal, round, and reactive to light.  Neck:     Thyroid: No thyromegaly.     Vascular: No JVD.     Trachea: Trachea and phonation normal. No tracheal deviation.  Cardiovascular:     Rate and Rhythm: Normal rate and regular rhythm.     Pulses: Normal pulses.     Heart sounds: Normal heart sounds, S1 normal and S2 normal. No murmur heard.    No friction rub. No gallop.  Pulmonary:     Effort: Pulmonary effort is normal. No accessory muscle usage or respiratory distress.     Breath sounds: Normal breath sounds and air entry. No stridor. No wheezing or rales.  Chest:     Chest wall: No tenderness.     Comments: Sees OBGYN,  declined breast exam Abdominal:     General: Bowel sounds are normal. There is no distension.     Palpations: Abdomen is soft. There is no shifting dullness, fluid wave, mass or pulsatile mass.     Tenderness: There is no abdominal tenderness. There is no guarding or rebound.  Musculoskeletal:        General: No tenderness or deformity. Normal range of motion.      Cervical back: Normal range of motion and neck supple.     Right lower leg: No edema.     Left lower leg: No edema.  Lymphadenopathy:     Cervical: No cervical adenopathy.  Skin:    General: Skin is warm and dry.     Coloration: Skin is not pale.     Findings: No erythema or rash.  Neurological:     Mental Status: She is alert.     Cranial Nerves: No cranial nerve deficit.     Motor: No abnormal muscle tone.     Coordination: Coordination normal.     Deep Tendon Reflexes: Reflexes are normal and symmetric.  Psychiatric:        Mood and Affect: Mood and affect normal.        Speech: Speech normal.        Behavior: Behavior normal. Behavior is cooperative.        Thought Content: Thought content normal.        Judgment: Judgment normal.        Assessment/Plan: 1. Encounter for routine adult health examination with abnormal findings Age-appropriate preventive screenings and vaccinations discussed, annual physical exam completed. Routine labs drawn earlier in the year and previously discussed. PHM updated.  - cetirizine (ZYRTEC ALLERGY) 10 MG tablet; Take 1 tablet (10 mg total) by mouth daily.  Dispense: 30 tablet; Refill: 3  2. Dizziness Has meclizine prescription at home if needed but has improved since previous office visit  3. B12 deficiency May continue prescription B12 injection monthly  4. Vitamin D deficiency May continue over-the-counter vitamin D supplement   5. Dysuria - UA/M w/rflx Culture, Routine - Microscopic Examination - Urine Culture, Reflex  6. S/P laparoscopic sleeve gastrectomy Doing well since surgery in October last year, continuing to lose weight at a steady pace.  Denies having any issues or complications since the surgery.  We will reach out if necessary in the future      General Counseling: Catha verbalizes understanding of the findings of todays visit and agrees  with plan of treatment. I have discussed any further diagnostic  evaluation that may be needed or ordered today. We also reviewed her medications today. she has been encouraged to call the office with any questions or concerns that should arise related to todays visit.    Orders Placed This Encounter  Procedures   Microscopic Examination   Urine Culture, Reflex   UA/M w/rflx Culture, Routine    Meds ordered this encounter  Medications   cetirizine (ZYRTEC ALLERGY) 10 MG tablet    Sig: Take 1 tablet (10 mg total) by mouth daily.    Dispense:  30 tablet    Refill:  3    Return in about 1 year (around 05/01/2023) for CPE, Shante Archambeault PCP.   Total time spent:30 Minutes Time spent includes review of chart, medications, test results, and follow up plan with the patient.   [place] Controlled Substance Database was reviewed by me.  This patient was seen by Sallyanne Kuster, FNP-C in collaboration with Dr. Beverely Risen as a part of collaborative care agreement.  Amelda Hapke R. Tedd Sias, MSN, FNP-C Internal medicine

## 2022-05-03 ENCOUNTER — Telehealth: Payer: Self-pay

## 2022-05-03 LAB — UA/M W/RFLX CULTURE, ROUTINE
Bilirubin, UA: NEGATIVE
Glucose, UA: NEGATIVE
Ketones, UA: NEGATIVE
Nitrite, UA: NEGATIVE
Protein,UA: NEGATIVE
RBC, UA: NEGATIVE
Specific Gravity, UA: 1.016 (ref 1.005–1.030)
Urobilinogen, Ur: 0.2 mg/dL (ref 0.2–1.0)
pH, UA: 5.5 (ref 5.0–7.5)

## 2022-05-03 LAB — MICROSCOPIC EXAMINATION
Casts: NONE SEEN /lpf
Epithelial Cells (non renal): >10 /hpf — AB (ref 0–10)
RBC, Urine: NONE SEEN /hpf (ref 0–2)

## 2022-05-03 LAB — URINE CULTURE, REFLEX

## 2022-05-04 NOTE — Telephone Encounter (Signed)
Spoke to pt, she is not having symptoms and per Alyssa does not need to be treated. Pt is aware

## 2022-05-07 ENCOUNTER — Encounter: Payer: Self-pay | Admitting: Family Medicine

## 2022-05-07 ENCOUNTER — Other Ambulatory Visit (HOSPITAL_COMMUNITY)
Admission: RE | Admit: 2022-05-07 | Discharge: 2022-05-07 | Disposition: A | Discharge status: Home / Self Care | Payer: BC Managed Care – PPO | Source: Ambulatory Visit | Attending: Family Medicine | Admitting: Family Medicine

## 2022-05-07 ENCOUNTER — Ambulatory Visit (INDEPENDENT_AMBULATORY_CARE_PROVIDER_SITE_OTHER): Payer: BC Managed Care – PPO | Admitting: Family Medicine

## 2022-05-07 VITALS — BP 100/70 | Ht 66.0 in | Wt 215.0 lb

## 2022-05-07 DIAGNOSIS — Z01419 Encounter for gynecological examination (general) (routine) without abnormal findings: Secondary | ICD-10-CM | POA: Diagnosis not present

## 2022-05-07 DIAGNOSIS — Z01411 Encounter for gynecological examination (general) (routine) with abnormal findings: Secondary | ICD-10-CM | POA: Diagnosis not present

## 2022-05-07 DIAGNOSIS — Z30431 Encounter for routine checking of intrauterine contraceptive device: Secondary | ICD-10-CM | POA: Diagnosis not present

## 2022-05-07 DIAGNOSIS — Z113 Encounter for screening for infections with a predominantly sexual mode of transmission: Secondary | ICD-10-CM | POA: Diagnosis not present

## 2022-05-07 DIAGNOSIS — N760 Acute vaginitis: Secondary | ICD-10-CM | POA: Diagnosis not present

## 2022-05-07 DIAGNOSIS — R300 Dysuria: Secondary | ICD-10-CM | POA: Diagnosis not present

## 2022-05-07 DIAGNOSIS — B3731 Acute candidiasis of vulva and vagina: Secondary | ICD-10-CM | POA: Insufficient documentation

## 2022-05-07 DIAGNOSIS — Z9079 Acquired absence of other genital organ(s): Secondary | ICD-10-CM

## 2022-05-07 LAB — POCT URINALYSIS DIPSTICK
Bilirubin, UA: NEGATIVE
Blood, UA: NEGATIVE
Glucose, UA: NEGATIVE
Ketones, UA: NEGATIVE
Nitrite, UA: NEGATIVE
Protein, UA: POSITIVE — AB
Urobilinogen, UA: 0.2 E.U./dL

## 2022-05-07 NOTE — Progress Notes (Signed)
GYNECOLOGY ANNUAL PREVENTATIVE CARE ENCOUNTER NOTE  Subjective:   Cathy Webster is a 31 y.o. [phone] female here for a routine annual gynecologic exam.  Current complaints: had white blood cells in her urine and is worried about infection. No other concerns. She has an IUD, likes this method.   Denies abnormal vaginal bleeding, discharge, pelvic pain, problems with intercourse or other gynecologic concerns.    Gynecologic History Patient's last menstrual period was 04/09/2022 (approximate). Contraception: IUD Last Pap: 2020. Results were: normal Last mammogram: NA.   Health Maintenance Due  Topic Date Due   Hepatitis C Screening  Never done    The following portions of the patient's history were reviewed and updated as appropriate: allergies, current medications, past family history, past medical history, past social history, past surgical history and problem list.  Review of Systems Pertinent items are noted in HPI.   Objective:  BP 100/70   Ht 5\' 6"  (1.676 m)   Wt 215 lb (97.5 kg)   LMP 04/09/2022 (Approximate)   Breastfeeding No   BMI 34.70 kg/m  CONSTITUTIONAL: Well-developed, well-nourished female in no acute distress.  HENT:  Normocephalic, atraumatic, External right and left ear normal. Oropharynx is clear and moist EYES:  No scleral icterus.  NECK: Normal range of motion, supple, no masses.  Normal thyroid.  SKIN: Skin is warm and dry. No rash noted. Not diaphoretic. No erythema. No pallor. NEUROLOGIC: Alert and oriented to person, place, and time. Normal reflexes, muscle tone coordination. No cranial nerve deficit noted. PSYCHIATRIC: Normal mood and affect. Normal behavior. Normal judgment and thought content. CARDIOVASCULAR: Normal heart rate noted, regular rhythm. 2+ distal pulses. RESPIRATORY: Effort and breath sounds normal, no problems with respiration noted. BREASTS: Symmetric in size. No masses, skin changes, nipple drainage, or  lymphadenopathy. ABDOMEN: Soft,  no distention noted.  No tenderness, rebound or guarding.  PELVIC: Normal appearing external genitalia; normal appearing vaginal mucosa and cervix.  No abnormal discharge noted.  Pap smear obtained.  Normal uterine size, no other palpable masses, no uterine or adnexal tenderness. MUSCULOSKELETAL: Normal range of motion.     Assessment and Plan:  1) Annual gynecologic examination with pap smear:  Will follow up results of pap smear and manage accordingly. STI screen also ordered today.  Routine preventative health maintenance measures emphasized.  2) Contraception counseling: Reviewed all forms of birth control options available including abstinence; over the counter/barrier methods; hormonal contraceptive medication including pill, patch, ring, injection,contraceptive implant; hormonal and nonhormonal IUDs; permanent sterilization options including vasectomy and the various tubal sterilization modalities. Risks and benefits reviewed.  Questions were answered.  Written information was also given to the patient to review.  Patient desires to keep IUD, this was prescribed for patient. She will follow up in  1 year for surveillance.  She was told to call with any further questions, or with any concerns about this method of contraception.  Emphasized use of condoms 100% of the time for STI prevention. 1. Well woman exam with routine gynecological exam - Cytology - PAP - HIV Antibody (routine testing w rflx) - RPR - Cervicovaginal ancillary only  2. Encounter for routine checking of intrauterine contraceptive device (IUD)  3. Dysuria - Urine Culture - POCT Urinalysis Dipstick  4. Screening examination for venereal disease  - HV Antibody (routine testing w rflx) - RPR - Cervicovaginal ancillary only  5. H/O unilateral salpingectomy (left due to ectopic)    Please refer to After Visit Summary for other counseling recommendations.  No follow-ups on  file.  Federico Flake, MD, MPH, ABFM Attending [facility]ician [facility] for [facility]

## 2022-05-08 LAB — CERVICOVAGINAL ANCILLARY ONLY
Bacterial Vaginitis (gardnerella): POSITIVE — AB
Candida Glabrata: NEGATIVE
Candida Vaginitis: POSITIVE — AB
Chlamydia: NEGATIVE
Comment: NEGATIVE
Comment: NEGATIVE
Comment: NEGATIVE
Comment: NEGATIVE
Comment: NEGATIVE
Comment: NORMAL
Neisseria Gonorrhea: NEGATIVE
Trichomonas: NEGATIVE

## 2022-05-08 LAB — HIV ANTIBODY (ROUTINE TESTING W REFLEX): HIV Screen 4th Generation wRfx: NONREACTIVE

## 2022-05-08 LAB — RPR: RPR Ser Ql: NONREACTIVE

## 2022-05-09 LAB — URINE CULTURE

## 2022-05-10 ENCOUNTER — Encounter: Payer: Self-pay | Admitting: Family Medicine

## 2022-05-10 DIAGNOSIS — B9689 Other specified bacterial agents as the cause of diseases classified elsewhere: Secondary | ICD-10-CM

## 2022-05-10 DIAGNOSIS — B379 Candidiasis, unspecified: Secondary | ICD-10-CM

## 2022-05-10 LAB — CYTOLOGY - PAP
Comment: NEGATIVE
Comment: NEGATIVE
Comment: NEGATIVE
Diagnosis: NEGATIVE
Diagnosis: REACTIVE
HPV 16: NEGATIVE
HPV 18 / 45: NEGATIVE
High risk HPV: POSITIVE — AB

## 2022-05-10 MED ORDER — FLUCONAZOLE 150 MG PO TABS: 150.0000 mg | ORAL_TABLET | Freq: Once | ORAL | 0 refills | Status: AC

## 2022-05-10 MED ORDER — METRONIDAZOLE 500 MG PO TABS: 500.0000 mg | ORAL_TABLET | Freq: Two times a day (BID) | ORAL | 0 refills | Status: DC

## 2022-05-14 ENCOUNTER — Encounter: Payer: Self-pay | Admitting: Family Medicine

## 2022-05-15 ENCOUNTER — Telehealth: Payer: Self-pay | Admitting: Lactation Services

## 2022-05-15 NOTE — Telephone Encounter (Signed)
-----   Message from Federico Flake, MD sent at 05/14/2022  5:56 PM EDT ----- NIL, HPV POS. Recommend repeat pap in 1 year

## 2022-05-15 NOTE — Telephone Encounter (Signed)
Called patient in regards to Pap Smear results. Patient did read recommendations in My Chart by Dr. Alvester Morin. Patient did not answer and mailbox full so not able to leave a message.

## 2022-06-02 ENCOUNTER — Encounter: Payer: Self-pay | Admitting: Nurse Practitioner

## 2022-07-14 ENCOUNTER — Other Ambulatory Visit: Payer: Self-pay | Admitting: Physician Assistant

## 2022-07-14 DIAGNOSIS — M25571 Pain in right ankle and joints of right foot: Secondary | ICD-10-CM

## 2022-11-22 ENCOUNTER — Other Ambulatory Visit: Payer: Self-pay

## 2022-11-22 ENCOUNTER — Emergency Department (HOSPITAL_BASED_OUTPATIENT_CLINIC_OR_DEPARTMENT_OTHER): Payer: BC Managed Care – PPO

## 2022-11-22 ENCOUNTER — Emergency Department (HOSPITAL_BASED_OUTPATIENT_CLINIC_OR_DEPARTMENT_OTHER)
Admission: EM | Admit: 2022-11-22 | Discharge: 2022-11-22 | Disposition: A | Discharge status: Home / Self Care | Payer: BC Managed Care – PPO | Attending: Emergency Medicine | Admitting: Emergency Medicine

## 2022-11-22 ENCOUNTER — Encounter (HOSPITAL_BASED_OUTPATIENT_CLINIC_OR_DEPARTMENT_OTHER): Payer: Self-pay | Admitting: Emergency Medicine

## 2022-11-22 ENCOUNTER — Other Ambulatory Visit (HOSPITAL_BASED_OUTPATIENT_CLINIC_OR_DEPARTMENT_OTHER): Payer: Self-pay

## 2022-11-22 DIAGNOSIS — R1084 Generalized abdominal pain: Secondary | ICD-10-CM

## 2022-11-22 DIAGNOSIS — R1013 Epigastric pain: Secondary | ICD-10-CM | POA: Diagnosis not present

## 2022-11-22 DIAGNOSIS — Z20822 Contact with and (suspected) exposure to covid-19: Secondary | ICD-10-CM | POA: Diagnosis not present

## 2022-11-22 DIAGNOSIS — R1033 Periumbilical pain: Secondary | ICD-10-CM | POA: Diagnosis not present

## 2022-11-22 DIAGNOSIS — R519 Headache, unspecified: Secondary | ICD-10-CM | POA: Diagnosis not present

## 2022-11-22 LAB — CBC
HCT: 37.7 % (ref 36.0–46.0)
Hemoglobin: 12.5 g/dL (ref 12.0–15.0)
MCH: 28.9 pg (ref 26.0–34.0)
MCHC: 33.2 g/dL (ref 30.0–36.0)
MCV: 87.3 fL (ref 80.0–100.0)
Platelets: 170 10*3/uL (ref 150–400)
RBC: 4.32 MIL/uL (ref 3.87–5.11)
RDW: 12.4 % (ref 11.5–15.5)
WBC: 3.3 10*3/uL — ABNORMAL LOW (ref 4.0–10.5)
nRBC: 0 % (ref 0.0–0.2)

## 2022-11-22 LAB — COMPREHENSIVE METABOLIC PANEL
ALT: 10 U/L (ref 0–44)
AST: 18 U/L (ref 15–41)
Albumin: 4.1 g/dL (ref 3.5–5.0)
Alkaline Phosphatase: 38 U/L (ref 38–126)
Anion gap: 11 (ref 5–15)
BUN: 8 mg/dL (ref 6–20)
CO2: 20 mmol/L — ABNORMAL LOW (ref 22–32)
Calcium: 9.1 mg/dL (ref 8.9–10.3)
Chloride: 106 mmol/L (ref 98–111)
Creatinine, Ser: 0.6 mg/dL (ref 0.44–1.00)
GFR, Estimated: >60 mL/min (ref >60)
Glucose, Bld: 77 mg/dL (ref 70–99)
Potassium: 4.4 mmol/L (ref 3.5–5.1)
Sodium: 137 mmol/L (ref 135–145)
Total Bilirubin: 0.7 mg/dL (ref 0.3–1.2)
Total Protein: 7.1 g/dL (ref 6.5–8.1)

## 2022-11-22 LAB — RESP PANEL BY RT-PCR (RSV, FLU A&B, COVID)  RVPGX2
Influenza A by PCR: NEGATIVE
Influenza B by PCR: NEGATIVE
Resp Syncytial Virus by PCR: NEGATIVE
SARS Coronavirus 2 by RT PCR: NEGATIVE

## 2022-11-22 LAB — URINALYSIS, ROUTINE W REFLEX MICROSCOPIC
Bilirubin Urine: NEGATIVE
Glucose, UA: NEGATIVE mg/dL
Hgb urine dipstick: NEGATIVE
Ketones, ur: NEGATIVE mg/dL
Leukocytes,Ua: NEGATIVE
Nitrite: NEGATIVE
Protein, ur: NEGATIVE mg/dL
Specific Gravity, Urine: 1.02 (ref 1.005–1.030)
pH: 6.5 (ref 5.0–8.0)

## 2022-11-22 LAB — LIPASE, BLOOD: Lipase: 13 U/L (ref 11–51)

## 2022-11-22 LAB — PREGNANCY, URINE: Preg Test, Ur: NEGATIVE

## 2022-11-22 MED ORDER — FAMOTIDINE IN NACL 20-0.9 MG/50ML-% IV SOLN
20.0000 mg | Freq: Once | INTRAVENOUS | Status: AC
  Administered 2022-11-22: 20 mg via INTRAVENOUS
  Filled 2022-11-22: qty 50

## 2022-11-22 MED ORDER — BUTALBITAL-APAP-CAFFEINE 50-325-40 MG PO TABS
1.0000 | ORAL_TABLET | Freq: Four times a day (QID) | ORAL | 0 refills | Status: AC | PRN
  Filled 2022-11-22: qty 6, 2d supply, fill #0

## 2022-11-22 MED ORDER — DIPHENHYDRAMINE HCL 50 MG/ML IJ SOLN
25.0000 mg | Freq: Once | INTRAMUSCULAR | Status: AC
  Administered 2022-11-22: 25 mg via INTRAVENOUS
  Filled 2022-11-22: qty 1

## 2022-11-22 MED ORDER — PANTOPRAZOLE SODIUM 40 MG PO TBEC
40.0000 mg | DELAYED_RELEASE_TABLET | Freq: Every day | ORAL | 0 refills | Status: AC
  Filled 2022-11-22: qty 30, 30d supply, fill #0

## 2022-11-22 MED ORDER — METOCLOPRAMIDE HCL 5 MG/ML IJ SOLN
10.0000 mg | Freq: Once | INTRAMUSCULAR | Status: AC
  Administered 2022-11-22: 10 mg via INTRAVENOUS
  Filled 2022-11-22: qty 2

## 2022-11-22 MED ORDER — IOHEXOL 300 MG/ML  SOLN
100.0000 mL | Freq: Once | INTRAMUSCULAR | Status: AC | PRN
  Administered 2022-11-22: 85 mL via INTRAVENOUS

## 2022-11-22 NOTE — ED Triage Notes (Signed)
Pt arrives to ED with c/o headache x1 week and abdominal pains x5 days.

## 2022-11-22 NOTE — ED Notes (Signed)
Dc instructions reviewed with patient. Patient voiced understanding. Dc with belongings.  °

## 2022-11-22 NOTE — Discharge Instructions (Addendum)
You are seen today for headache and abdominal pain-would medication such as aspirin, Aleve and ibuprofen for your headache for now to decrease risk of stomach upset.  Your CT scan of your abdomen was reassuring and showed a moderate stool burden but no other acute findings.  You do have incidental findings of sacroiliitis which is inflammation of the joint in your pelvis.  Follow-up with your primary care doctor regarding this

## 2022-11-22 NOTE — ED Provider Notes (Signed)
[facility] Provider Note   CSN: [id number] Arrival date & time: 11/22/22  [id number]     History  Chief Complaint  Patient presents with   Abdominal Pain   Headache    Cathy Webster is a 32 y.o. female.  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  She presents to the emergency department with 2 separate complaints.    First she states she been having a headache for 1 week.  States it started out mild but has increased in intensity.  Has been intermittent and gets better with over-the-counter migraine medicine but she states it feels like there is a tight band around her head.  She denies fevers, chills, neck stiffness, sinus congestion, vision changes, numbness or tingling, weakness or other associated symptoms.  States that seems to get worse throughout the day.  She denies any vomiting or photophobia with this.  Second she states that she has been having some epigastric abdominal pain.  She states this started about 3 to 4 days ago with loose stools and intermittent crampy abdominal pain is gradually worsened.  She is having increased stool frequency, she states it is soft but not watery.  She states after she eats she feels burning sensation in her stomach and gets some sharp pains in her epigastric area into her mid back, she denies vomiting but does admit to some nausea with this.  Denies urinary complaints.  Denies any hematemesis or hematochezia, no black stools.  Worse with eating.  She denies significant alleviating factors.     Abdominal Pain Headache Associated symptoms: abdominal pain        Home Medications Prior to Admission medications   Medication Sig Start Date End Date Taking? Authorizing Provider  butalbital-acetaminophen-caffeine (FIORICET) 50-325-40 MG tablet Take 1 tablet by mouth every 6 (six) hours as needed for headache. 11/22/22 11/22/23 Yes  Jaela Yepez A, PA-C  pantoprazole (PROTONIX) 40 MG tablet Take 1 tablet (40 mg total) by mouth daily. 11/22/22  Yes Magali Bray A, PA-C  cetirizine (ZYRTEC ALLERGY) 10 MG tablet Take 1 tablet (10 mg total) by mouth daily. 04/30/22   Jonetta Osgood, NP  cyanocobalamin (,VITAMIN B-12,) 1000 MCG/ML injection Use as directed  once a month 04/06/21   Jonetta Osgood, NP  dicyclomine (BENTYL) 20 MG tablet Take 1 tablet (20 mg total) by mouth 3 (three) times daily before meals. 03/07/21   Lucilla Lame, MD  hydrOXYzine (ATARAX/VISTARIL) 50 MG tablet Take 1 tablet (50 mg total) by mouth 3 (three) times daily as needed for itching. 10/22/19   Sable Feil, PA-C  levonorgestrel (LILETTA, 52 MG,) 20.1 MCG/DAY IUD IUD 1 each by Intrauterine route once. 04/09/16   [provider]  meclizine (ANTIVERT) 25 MG tablet Take 1 tablet (25 mg total) by mouth 3 (three) times daily as needed for dizziness. 11/08/21   Jonetta Osgood, NP  meloxicam (MOBIC) 15 MG tablet Take 1 tablet (15 mg total) by mouth daily. 02/22/22   McDonough, Si Gaul, PA-C  metroNIDAZOLE (FLAGYL) 500 MG tablet Take 1 tablet (500 mg total) by mouth 2 (two) times daily. 05/10/22   Caren Macadam, MD  SYRINGE-NEEDLE, DISP, 3 ML (LUER LOCK SAFETY SYRINGES) 22G X 1" 3 ML MISC Use as directed with B12 injection 04/06/21   Abernathy, Yetta Flock, NP  fluticasone (FLONASE) 50 MCG/ACT nasal spray Place 2 sprays into both nostrils daily. 08/19/19 10/22/19  Menshew, Dannielle Karvonen,  PA-C      Allergies    Patient has no known allergies.    Review of Systems   Review of Systems  Gastrointestinal:  Positive for abdominal pain.  Neurological:  Positive for headaches.    Physical Exam Updated Vital Signs BP 120/74   Pulse 80   Temp 98 F (36.7 C) (Oral)   Resp 20   Ht '5\' 6"'$  (1.676 m)   Wt 93.4 kg   SpO2 100%   BMI 33.25 kg/m  Physical Exam Vitals and nursing note reviewed.  Constitutional:      General: She is not in acute  distress.    Appearance: She is well-developed.  HENT:     Head: Normocephalic and atraumatic.  Eyes:     Conjunctiva/sclera: Conjunctivae normal.  Cardiovascular:     Rate and Rhythm: Normal rate and regular rhythm.     Heart sounds: No murmur heard. Pulmonary:     Effort: Pulmonary effort is normal. No respiratory distress.     Breath sounds: Normal breath sounds.  Abdominal:     Palpations: Abdomen is soft.     Tenderness: There is abdominal tenderness in the epigastric area and periumbilical area. There is no right CVA tenderness, left CVA tenderness, guarding or rebound. Negative signs include Murphy's sign and McBurney's sign.  Musculoskeletal:        General: No swelling.     Cervical back: Neck supple.  Skin:    General: Skin is warm and dry.     Capillary Refill: Capillary refill takes less than 2 seconds.  Neurological:     Mental Status: She is alert.  Psychiatric:        Mood and Affect: Mood normal.     ED Results / Procedures / Treatments   Labs (all labs ordered are listed, but only abnormal results are displayed) Labs Reviewed  COMPREHENSIVE METABOLIC PANEL - Abnormal; Notable for the following components:      Result Value   CO2 20 (*)    All other components within normal limits  CBC - Abnormal; Notable for the following components:   WBC 3.3 (*)    All other components within normal limits  RESP PANEL BY RT-PCR (RSV, FLU A&B, COVID)  RVPGX2  LIPASE, BLOOD  URINALYSIS, ROUTINE W REFLEX MICROSCOPIC  PREGNANCY, URINE    EKG None  Radiology CT ABDOMEN PELVIS W CONTRAST  Result Date: 11/22/2022 CLINICAL DATA:  Abdominal pain for 5 days. History of C-section and gastric sleeve. EXAM: CT ABDOMEN AND PELVIS WITH CONTRAST TECHNIQUE: Multidetector CT imaging of the abdomen and pelvis was performed using the standard protocol following bolus administration of intravenous contrast. RADIATION DOSE REDUCTION: This exam was performed according to the  departmental dose-optimization program which includes automated exposure control, adjustment of the mA and/or kV according to patient size and/or use of iterative reconstruction technique. CONTRAST:  85m OMNIPAQUE IOHEXOL 300 MG/ML  SOLN COMPARISON:  Abdomen/pelvis 08/10/2020 FINDINGS: Lower chest: The lung bases are clear. The imaged heart is unremarkable. Hepatobiliary: The liver and gallbladder are unremarkable. There is no biliary ductal dilatation. Pancreas: Unremarkable. Spleen: Unremarkable. Adrenals/Urinary Tract: The adrenals are unremarkable. The kidneys are unremarkable, with no focal lesion, stone, hydronephrosis, or hydroureter. The bladder is unremarkable. Stomach/Bowel: Postsurgical changes reflecting gastric sleeve surgery are again seen. There is no evidence of bowel obstruction. There is no abnormal bowel wall thickening or inflammatory change. There is a moderate stool burden in the colon. The appendix is normal. Vascular/Lymphatic: The abdominal  aorta is normal in course and caliber. The major branch vessels are patent. The main portal and splenic veins are patent. There is no abdominopelvic lymphadenopathy. Reproductive: An IUD is noted in the uterus. There is 1.7 cm right ovarian cyst, likely physiologic requiring no specific imaging follow-up. There is no suspicious adnexal mass. Other: There is trace free fluid in the pelvis, within physiologic limits. There is no upper abdominal ascites. There is no free intraperitoneal air. Musculoskeletal: There is no acute osseous abnormality or suspicious osseous lesion. Sclerosis of the bilateral SI joints, left worse than right, is unchanged since 2021. IMPRESSION: 1. Moderate colonic stool burden. No acute finding in the abdomen or pelvis. 2. Unchanged left worse than right sacroiliitis. Electronically Signed   By: Valetta Mole M.D.   On: 11/22/2022 10:22    Procedures Procedures    Medications Ordered in ED Medications  metoCLOPramide  (REGLAN) injection 10 mg (10 mg Intravenous Given 11/22/22 [id number])  diphenhydrAMINE (BENADRYL) injection 25 mg (25 mg Intravenous Given 11/22/22 0922)  famotidine (PEPCID) IVPB 20 mg premix (20 mg Intravenous New Bag/Given 11/22/22 0925)  iohexol (OMNIPAQUE) 300 MG/ML solution 100 mL (85 mLs Intravenous Contrast Given 11/22/22 1006)    ED Course/ Medical Decision Making/ A&P                             Medical Decision Making This patient presents to the ED for concern of headache X 1 week with 3-4 days of abdominal pain, this involves an extensive number of treatment options, and is a complaint that carries with it a high risk of complications and morbidity.  The differential diagnosis includes migraine, tension headache, sinusitis, Intracranial hemorrhage, cluster headache, medication overuse headache, intracranial mass, other; cholecystitis, peptic ulcer disease, pancreatitis, enteritis, colitis, appendicitis, ectopic pregnancy, other    Co morbidities that complicate the patient evaluation  Gastric sleeve surgery history   Additional history obtained:  Additional history obtained from EMR External records from outside source obtained and reviewed including outpatient PCP and GYN visits   Lab Tests:  I Ordered, and personally interpreted labs.  The pertinent results include: Mild leukopenia, otherwise reassuring CBC, CMP lipase urinalysis, patient's pregnancy test was negative   Imaging Studies ordered:  I ordered imaging studies including CT abdomen and pelvis  I independently visualized and interpreted imaging which showed full burden, no acute findings I agree with the radiologist interpretation     Problem List / ED Course / Critical interventions / Medication management  Headache- No red flags  and resolved with reglan and benadryl, has normal neurologic exam, medication for CT at this time, likely tension headache given patient's description is a tight band around her  head.  She has no symptoms of sinusitis.  Abdominal pain-patient has mild tenderness and reassuring as but given her history of gastric bypass and her reports that she had not follow-up with GI since 6 months after surgery a year and a half ago as directed CT was ordered to rule out any complications of this.  The results were reassuring, patient is feeling much better she is going to follow-up with her GI doctor and was given strict return precautions.  Prescribed Protonix  I ordered medication including reglan and pepcid  for headache, nausea, stomach burning  Reevaluation of the patient after these medicines showed that the patient improved I have reviewed the patients home medicines and have made adjustments as needed  Amount and/or Complexity of Data Reviewed Labs: ordered. Radiology: ordered.  Risk Prescription drug management.           Final Clinical Impression(s) / ED Diagnoses Final diagnoses:  Generalized abdominal pain  Acute nonintractable headache, unspecified headache type    Rx / DC Orders ED Discharge Orders          Ordered    butalbital-acetaminophen-caffeine (FIORICET) 50-325-40 MG tablet  Every 6 hours PRN        11/22/22 1044    pantoprazole (PROTONIX) 40 MG tablet  Daily        11/22/22 [address] [place] A, PA-C 11/22/22 1821    Lorelle Gibbs, DO 12/04/22 1519

## 2023-03-15 ENCOUNTER — Other Ambulatory Visit (HOSPITAL_COMMUNITY): Payer: Self-pay

## 2023-05-06 ENCOUNTER — Encounter: Payer: BC Managed Care – PPO | Admitting: Physician Assistant

## 2023-06-24 ENCOUNTER — Encounter: Payer: BC Managed Care – PPO | Admitting: Physician Assistant

## 2023-07-06 ENCOUNTER — Other Ambulatory Visit: Payer: Self-pay | Admitting: Nurse Practitioner

## 2023-07-06 DIAGNOSIS — Z0001 Encounter for general adult medical examination with abnormal findings: Secondary | ICD-10-CM

## 2023-07-23 ENCOUNTER — Telehealth: Payer: Self-pay | Admitting: Physician Assistant

## 2023-07-23 NOTE — Telephone Encounter (Signed)
Left vm and sent mychart message to confirm 07/29/23 appointment-Toni

## 2023-07-29 ENCOUNTER — Encounter: Payer: BC Managed Care – PPO | Admitting: Physician Assistant

## 2023-10-22 ENCOUNTER — Other Ambulatory Visit (HOSPITAL_COMMUNITY)
Admission: RE | Admit: 2023-10-22 | Discharge: 2023-10-22 | Disposition: A | Discharge status: Home / Self Care | Payer: 59 | Source: Ambulatory Visit | Attending: Obstetrics | Admitting: Obstetrics

## 2023-10-22 ENCOUNTER — Encounter: Payer: Self-pay | Admitting: Obstetrics

## 2023-10-22 ENCOUNTER — Ambulatory Visit: Payer: 59 | Admitting: Obstetrics

## 2023-10-22 VITALS — BP 126/72 | HR 96 | Ht 66.0 in | Wt 227.0 lb

## 2023-10-22 DIAGNOSIS — Z113 Encounter for screening for infections with a predominantly sexual mode of transmission: Secondary | ICD-10-CM

## 2023-10-22 DIAGNOSIS — R87810 Cervical high risk human papillomavirus (HPV) DNA test positive: Secondary | ICD-10-CM | POA: Insufficient documentation

## 2023-10-22 DIAGNOSIS — Z30433 Encounter for removal and reinsertion of intrauterine contraceptive device: Secondary | ICD-10-CM | POA: Diagnosis not present

## 2023-10-22 DIAGNOSIS — Z30430 Encounter for insertion of intrauterine contraceptive device: Secondary | ICD-10-CM

## 2023-10-22 MED ORDER — LEVONORGESTREL 20.1 MCG/DAY IU IUD
1.0000 | INTRAUTERINE_SYSTEM | Freq: Once | INTRAUTERINE | Status: AC
  Administered 2023-10-22: 1 via INTRAUTERINE

## 2023-10-22 NOTE — Progress Notes (Signed)
    GYNECOLOGY OFFICE PROCEDURE NOTE  Cathy Webster is a 33 y.o. [id number] here for Liletta (2017) IUD removal and reinsertion. No GYN concerns.  Last pap smear was on 05/07/22 and was NILM HPV Positive, is due for repeat co-testing.    IUD Removal and Reinsertion  Patient identified, informed consent performed, consent signed.   Discussed risks of irregular bleeding, cramping, infection, malpositioning or misplacement of the IUD outside the uterus which may require further procedures. Also discussed >99% contraception efficacy, increased risk of ectopic pregnancy with failure of method.   Emphasized that this did not protect against STIs, condoms recommended during all sexual encounters.Advised to use backup contraception for one week as the risk of pregnancy is higher during the transition period of removing an IUD and replacing it with another one. Time out was performed. Speculum placed in the vagina. The strings of the IUD were grasped and pulled using ring forceps. The IUD was successfully removed in its entirety. The cervix was cleaned with Betadine x 2 and grasped anteriorly with a single tooth tenaculum.  The new Liletta  IUD insertion apparatus was used to sound the uterus to 8 cm;  the IUD was then placed per manufacturer's recommendations. Strings trimmed to 3 cm. Tenaculum was removed, good hemostasis noted. Patient tolerated procedure well.   Patient was given post-procedure instructions.  She was reminded to have backup contraception for one week during this transition period between IUDs.  Patient was also asked to check IUD strings periodically and follow up in 4 weeks for IUD check.  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    Cathy Mangle DO [facility]

## 2023-10-22 NOTE — Patient Instructions (Signed)
Intrauterine Device Insertion An intrauterine device (IUD) is a medical device that is inserted into the uterus to prevent pregnancy. It is a small, T-shaped device that has one or two nylon strings hanging down from it. The strings hang out of the lower part of the uterus (cervix) to allow for future IUD removal. There are two types of IUDs: Hormone IUD. This type of IUD is made of plastic and contains the hormone progestin (synthetic progesterone). A hormone IUD may last 3-5 years, depending on which one you have. Synthetic progesterone prevents pregnancy by: Thickening cervical mucus to prevent sperm from entering the uterus. Thinning the uterine lining to prevent a fertilized egg from implanting there. Copper IUD. This type of IUD has copper wire wrapped around it. A copper IUD may last up to 10 years. Copper prevents pregnancy by making the uterus and fallopian tubes produce a fluid that kills sperm. Tell a health care provider about: Any allergies you have. All medicines you are taking, including vitamins, herbs, eye drops, creams, and over-the-counter medicines. Any surgeries you have had. Any medical conditions you have, including any sexually transmitted infections (STIs) you may have. Whether you are pregnant or may be pregnant. What are the risks? Generally, this is a safe procedure. However, problems may occur, including: Infection. Bleeding. Allergic reactions to medicines. Puncture (perforation) of the uterus or damage to other structures or organs. Accidental placement of the IUD either in the muscle layer of the uterus (myometrium) or outside the uterus. The IUD falling out of the uterus (expulsion). This is more common among women who have recently had a child. Higher risk of an egg being fertilized outside your uterus (ectopic pregnancy).This is rare. Pelvic inflammatory disease (PID), which is an infection in the uterus and fallopian tubes. The IUD does not cause the  infection. The infection is usually from an unknown sexually transmitted infection (STI). This is rare, and it usually happens during the first 20 days after the IUD is inserted. What happens before the procedure? Ask your health care provider about: Changing or stopping your regular medicines. This is especially important if you are taking diabetes medicines or blood thinners. Taking over-the-counter medicines, vitamins, herbs, and supplements. Talk with your health care provider about when to schedule your IUD placement. Your health care provider may recommend taking over-the-counter pain medicines before the procedure. These medicines include ibuprofen and naproxen. You may have tests for: Pregnancy. A pregnancy test involves having a urine or blood sample taken. Sexually transmitted infections (STIs). Placing an IUD in someone who has an STI can make the infection worse. Cervical cancer. You may have a Pap test to check for this type of cancer. This means collecting cells from your cervix to be checked under a microscope. You may have a physical exam to determine the size and position of your uterus. What happens during the procedure? A tool (speculum) will be placed in your vagina and widened so that your health care provider can see your cervix. Medicine, or antiseptic, may be applied to your cervix to help lower your risk of infection. You may be given an anesthetic medicine to numb each side of your cervix. This medicine is usually given by an injection into the cervix. A tool called a uterine sound will be inserted into your uterus to check the length of your uterus and the direction that your uterus may be tilted. A slim instrument or tube (IUD inserter) that holds the IUD will be inserted into your vagina,  through your cervical canal, and into your uterus. The IUD will be placed in the uterus, and the IUD inserter will be removed. The strings that are attached to the IUD will be trimmed  so that they lie just below the cervix. The speculum will be removed. The procedure may vary among health care providers and hospitals. What can I expect after procedure? You may have bleeding after the procedure. This is normal. It varies from light bleeding (spotting) for a few days to menstrual-like bleeding. You may have cramping and pain in the abdomen. You may feel dizzy or light-headed. You may have lower back pain. You may have headaches and nausea. Follow these instructions at home: Before resuming sexual activity, check to make sure that you can feel the IUD string or strings. You should be able to feel the end of the string below the opening of your cervix. If your IUD string is in place, you may resume sexual activity. If you had a hormonal IUD inserted more than 7 days after your most recent period started, you will need to use a backup method of birth control for 7 days after IUD insertion. Ask your health care provider whether this applies to you. Continue to check that the IUD is still in place by feeling for the strings after every menstrual period, or once a month. An IUD will not protect you from sexually transmitted infections (STIs). Use methods to prevent the exchange of body fluids between partners (barrier protection) every time you have sex. Barrier protection can be used during oral, vaginal, or anal sex. Commonly used barrier methods include: Female condom. Female condom. Dental dam. Take over-the-counter and prescription medicines only as told by your health care provider. Keep all follow-up visits. This is important. Contact a health care provider if: You feel light-headed or weak. You have any of the following problems with your IUD string or strings: The string bothers or hurts you or your sexual partner. You cannot feel the string. The string has gotten longer. You can feel the IUD in your vagina. You think you may be pregnant, or you miss your menstrual  period. You think you may have a sexually transmitted infection (STI). Get help right away if you: You have flu-like symptoms, such as tiredness (fatigue) and muscle aches. You have a fever and chills. You have bleeding that is heavier or lasts longer than a normal menstrual cycle. You have abnormal or bad-smelling discharge from your vagina. You develop abdominal pain that is new, is getting worse, or is not in the same area of earlier cramping and pain. You have pain during sexual activity. Summary An intrauterine device (IUD) is a small, T-shaped device that has one or two nylon strings hanging down from it. You may have a copper IUD or a hormone IUD. Ask your health care provider what you need to do before the procedure. You may have some tests and you may have to change or stop some medicines. You may have bleeding after the procedure. This is normal. It varies from light spotting for a few days to menstrual-like bleeding. Check to make sure that you can feel the IUD strings before you resume sexual activity. Check the strings after every menstrual period or once a month. An IUD does not protect against STIs. Use other methods to protect yourself against infections. This information is not intended to replace advice given to you by your health care provider. Make sure you discuss any questions you have with  your health care provider. Document Revised: 04/06/2020 Document Reviewed: 04/06/2020 Elsevier Patient Education  2024 [employer].

## 2023-10-29 LAB — CYTOLOGY - PAP
Chlamydia: NEGATIVE
Comment: NEGATIVE
Comment: NEGATIVE
Comment: NEGATIVE
Comment: NEGATIVE
Comment: NORMAL
HPV 16: NEGATIVE
HPV 18 / 45: NEGATIVE
High risk HPV: POSITIVE — AB
Neisseria Gonorrhea: NEGATIVE

## 2023-11-04 ENCOUNTER — Encounter: Payer: Self-pay | Admitting: Obstetrics

## 2023-11-04 ENCOUNTER — Other Ambulatory Visit: Payer: Self-pay

## 2023-11-04 DIAGNOSIS — N760 Acute vaginitis: Secondary | ICD-10-CM

## 2023-11-04 MED ORDER — METRONIDAZOLE 500 MG PO TABS: 500.0000 mg | ORAL_TABLET | Freq: Two times a day (BID) | ORAL | 0 refills | Status: DC

## 2023-11-18 ENCOUNTER — Encounter: Payer: 59 | Admitting: Physician Assistant

## 2023-11-22 ENCOUNTER — Encounter: Payer: 59 | Admitting: Physician Assistant

## 2024-02-18 ENCOUNTER — Encounter: Payer: Self-pay | Admitting: Obstetrics and Gynecology

## 2024-02-18 ENCOUNTER — Ambulatory Visit (INDEPENDENT_AMBULATORY_CARE_PROVIDER_SITE_OTHER): Admitting: Obstetrics and Gynecology

## 2024-02-18 ENCOUNTER — Other Ambulatory Visit (HOSPITAL_COMMUNITY)
Admission: RE | Admit: 2024-02-18 | Discharge: 2024-02-18 | Disposition: A | Discharge status: Home / Self Care | Source: Ambulatory Visit | Attending: Obstetrics and Gynecology | Admitting: Obstetrics and Gynecology

## 2024-02-18 VITALS — BP 120/78 | HR 85 | Ht 66.0 in | Wt 242.6 lb

## 2024-02-18 DIAGNOSIS — Z23 Encounter for immunization: Secondary | ICD-10-CM

## 2024-02-18 DIAGNOSIS — B977 Papillomavirus as the cause of diseases classified elsewhere: Secondary | ICD-10-CM

## 2024-02-18 DIAGNOSIS — N870 Mild cervical dysplasia: Secondary | ICD-10-CM | POA: Diagnosis not present

## 2024-02-18 DIAGNOSIS — R87612 Low grade squamous intraepithelial lesion on cytologic smear of cervix (LGSIL): Secondary | ICD-10-CM | POA: Insufficient documentation

## 2024-02-18 NOTE — Progress Notes (Signed)
 HPI:  Cathy Webster is a 33 y.o.  [id number]  who presents today for evaluation and management of abnormal cervical cytology.    Dysplasia History: LGSIL     HPV: Positive  (Negative 16, 18)  ROS:  Pertinent items noted in HPI and remainder of comprehensive ROS otherwise negative.  OB History  Gravida Para Term Preterm AB Living  5 4 4  1 4   SAB IAB Ectopic Multiple Live Births    1 0 4    # Outcome Date GA Lbr Len/2nd Weight Sex Type Anes PTL Lv  5 Term 02/22/16 [redacted]w[redacted]d 23:48 / 00:09 6 lb 8.4 oz (2.96 kg) F VBAC EPI  LIV  4 Term 06/09/14 [redacted]w[redacted]d 09:38 / 00:13 7 lb 7.6 oz (3.39 kg) M VBAC EPI  LIV  3 Term 03/24/13 [redacted]w[redacted]d 06:25 / 00:13 6 lb 10.9 oz (3.03 kg) F VBAC EPI  LIV  2 Term 08/07/08 [redacted]w[redacted]d  6 lb 14 oz (3.118 kg) F CS-Unspec EPI  LIV  1 Ectopic             Past Medical History:  Diagnosis Date   Abnormal Pap smear 2008   Allergy    COVID-19 10/20/2019   Diagnosed on 10/20/2019   Cystitis 04/09/2016   Ectopic pregnancy 2020   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   Hives    Hypertension    Preeclampsia    UTI (lower urinary tract infection)     Past Surgical History:  Procedure Laterality Date   CESAREAN SECTION     LAPAROSCOPIC GASTRIC SLEEVE RESECTION     LAPAROSCOPY N/A 09/26/2018   Procedure: LAPAROSCOPY OPERATIVE WITH LEFT SALPINGECTOMY;  Surgeon: Granville Layer, MD;  Location: [facility];  Service: Gynecology;  Laterality: N/A;   Salpingectomy, for Ruptured ectopic Left 2020   TONSILLECTOMY      SOCIAL HISTORY:  Social History   Substance and Sexual Activity  Alcohol Use No    Social History   Substance and Sexual Activity  Drug Use No     Family History  Problem Relation Age of Onset   Diabetes Mother    Hypertension Mother    Asthma Brother     ALLERGIES:  Patient has no known allergies.  She has a current medication list which includes the following prescription(s): cetirizine , hydroxyzine , liletta  (52 mg), meloxicam , pantoprazole , and  [DISCONTINUED] fluticasone .  Physical Exam: -Vitals:  BP 120/78   Pulse 85   Ht 5\' 6"  (1.676 m)   Wt 242 lb 9.6 oz (110 kg)   LMP 02/07/2024   BMI 39.16 kg/m   PROCEDURE: Colposcopy performed with 4% acetic acid after verbal consent obtained                           -Aceto-white Lesions Location(s): See above              -Biopsy performed at 1 o'clock               -ECC indicated and performed: No.     -Biopsy sites made hemostatic with pressure and Monsel's solution   -Satisfactory colposcopy: Yes.      -Evidence of Invasive cervical CA :  NO  ASSESSMENT:  Cathy Webster is a 33 y.o. [id number] here for  1. 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 Need for HPV vaccination   .  PLAN: 1.  I discussed the grading system of  pap smears and HPV high risk viral types.  We will discuss management after colpo results return. 2.  Patient desires first dose of Gardasil vaccine today.  No orders of the defined types were placed in this encounter.          F/U  Return in about 2 weeks (around 03/03/2024) for Colpo f/u and then 2 month nurse visit for gardasil vac.Cathy Webster ,MD 02/18/2024,2:23 PM

## 2024-02-18 NOTE — Progress Notes (Signed)
 Patient presents today for a colposcopy. She recently had an abnormal pap smear resulting in lgsil/HPV+. No further concerns today.     Patient is a 33 y.o. [id number] female Single African American female who presents for her first Gardasil injection. Order to administer given by Billy Bue, MD on 02/18/24.  Given by: Woody Heading, CMA Site:  left deltoid   Patient will return in 2 months for second injection.    Vale Garrison, CMA

## 2024-02-20 LAB — SURGICAL PATHOLOGY

## 2024-02-21 ENCOUNTER — Ambulatory Visit: Payer: Self-pay

## 2024-02-28 ENCOUNTER — Telehealth: Payer: Self-pay | Admitting: Physician Assistant

## 2024-02-28 NOTE — Telephone Encounter (Signed)
 Per request, Jan 2023 - Dec 2023 MR faxed to [facility]; [phone] & [phone]

## 2024-03-04 ENCOUNTER — Telehealth (INDEPENDENT_AMBULATORY_CARE_PROVIDER_SITE_OTHER): Admitting: Obstetrics and Gynecology

## 2024-03-04 ENCOUNTER — Encounter: Payer: Self-pay | Admitting: Obstetrics and Gynecology

## 2024-03-04 DIAGNOSIS — N870 Mild cervical dysplasia: Secondary | ICD-10-CM

## 2024-03-04 NOTE — Progress Notes (Signed)
 Virtual Visit via Video Note  I connected with Cathy Webster on 03/04/24 at  7:55 AM EDT by video and verified that I was speaking with the correct person using two identifiers.    [name] is a 33 y.o. [id number] who LMP was Patient's last menstrual period was 02/07/2024. I discussed the limitations, risks, security and privacy concerns of performing an evaluation and management service by video and the availability of in person appointments. I also discussed with the patient that there may be a patient responsible charge related to this service. The patient expressed understanding and agreed to proceed.  Location of patient:  Home  Patient gave explicit verbal consent for video visit:  YES  Location of provider:  [facility] office  Persons other than physician and patient involved in provider conference:  None   Subjective:   History of Present Illness:    History of LGSIL with positive HPV.  She presents today after having a colposcopy performed.  Hx: The following portions of the patient's history were reviewed and updated as appropriate:             She  has a past medical history of Abnormal Pap smear (2008), Allergy, COVID-19 (10/20/2019), Cystitis (04/09/2016), Ectopic pregnancy (2020), H/O gastric sleeve (04/14/2021), Hives, Hypertension, Preeclampsia, and UTI (lower urinary tract infection). She does not have any pertinent problems on file. She  has a past surgical history that includes Cesarean section; Tonsillectomy; laparoscopy (N/A, 09/26/2018); Laparoscopic gastric sleeve resection; and Salpingectomy, for Ruptured ectopic (Left, 2020). Her family history includes Asthma in her brother; Diabetes in her mother; Hypertension in her mother. She  reports that she has never smoked. She has never used smokeless tobacco. She reports that she does not drink alcohol and does not use drugs. She has a current medication list which includes the following prescription(s):  cetirizine , hydroxyzine , liletta  (52 mg), meloxicam , pantoprazole , and [DISCONTINUED] fluticasone . She has no known allergies.       Review of Systems:  Review of Systems  Constitutional: Denied constitutional symptoms, night sweats, recent illness, fatigue, fever, insomnia and weight loss.  Eyes: Denied eye symptoms, eye pain, photophobia, vision change and visual disturbance.  Ears/Nose/Throat/Neck: Denied ear, nose, throat or neck symptoms, hearing loss, nasal discharge, sinus congestion and sore throat.  Cardiovascular: Denied cardiovascular symptoms, arrhythmia, chest pain/pressure, edema, exercise intolerance, orthopnea and palpitations.  Respiratory: Denied pulmonary symptoms, asthma, pleuritic pain, productive sputum, cough, dyspnea and wheezing.  Gastrointestinal: Denied, gastro-esophageal reflux, melena, nausea and vomiting.  Genitourinary: Denied genitourinary symptoms including symptomatic vaginal discharge, pelvic relaxation issues, and urinary complaints.  Musculoskeletal: Denied musculoskeletal symptoms, stiffness, swelling, muscle weakness and myalgia.  Dermatologic: Denied dermatology symptoms, rash and scar.  Neurologic: Denied neurology symptoms, dizziness, headache, neck pain and syncope.  Psychiatric: Denied psychiatric symptoms, anxiety and depression.  Endocrine: Denied endocrine symptoms including hot flashes and night sweats.   Meds:   Current Outpatient Medications on File Prior to Visit  Medication Sig Dispense Refill   cetirizine  (ZYRTEC ) 10 MG tablet TAKE 1 TABLET BY MOUTH EVERY DAY 30 tablet 3   hydrOXYzine  (ATARAX /VISTARIL ) 50 MG tablet Take 1 tablet (50 mg total) by mouth 3 (three) times daily as needed for itching. 15 tablet 0   levonorgestrel  (LILETTA , 52 MG,) 20.1 MCG/DAY IUD IUD 1 each by Intrauterine route once.     meloxicam  (MOBIC ) 15 MG tablet Take 1 tablet (15 mg total) by mouth daily. 30 tablet 0   pantoprazole  (PROTONIX ) 40 MG tablet Take 1  tablet  (40 mg total) by mouth daily. 30 tablet 0   [DISCONTINUED] fluticasone  (FLONASE ) 50 MCG/ACT nasal spray Place 2 sprays into both nostrils daily. 16 g 0   No current facility-administered medications on file prior to visit.    Assessment:     [id number] Patient Active Problem List   Diagnosis Date Noted   H/O unilateral salpingectomy (left due to ectopic) 2020   History of ectopic pregnancy 09/26/2018   Contraceptive management 04/09/2016   Abnormal Pap smear of cervix 12/22/2013     1. CIN I (cervical intraepithelial neoplasia I)     Based on colposcopically directed biopsies  Plan:            1.  CIN-1 discussed in detail.  HPV and its relationship to cervical dysplasia and eventually cervical cancer discussed.  Necessary follow-ups discussed.  Possible treatment options in the future discussed.  Based on ASCCP guidelines I have recommended a follow-up Pap smear in 1 year.  All questions answered. Orders No orders of the defined types were placed in this encounter.   No orders of the defined types were placed in this encounter.     F/U  Return in about 1 year (around 03/04/2025) for Annual Physical.   Delice Felt, M.D. 03/04/2024 8:04 AM

## 2024-04-20 ENCOUNTER — Ambulatory Visit

## 2024-04-20 DIAGNOSIS — Z23 Encounter for immunization: Secondary | ICD-10-CM

## 2024-04-24 ENCOUNTER — Ambulatory Visit

## 2024-07-10 DIAGNOSIS — E66812 Obesity, class 2: Secondary | ICD-10-CM | POA: Diagnosis not present

## 2024-07-10 DIAGNOSIS — Z9884 Bariatric surgery status: Secondary | ICD-10-CM | POA: Diagnosis not present

## 2024-07-10 DIAGNOSIS — Z0001 Encounter for general adult medical examination with abnormal findings: Secondary | ICD-10-CM | POA: Diagnosis not present

## 2024-07-10 DIAGNOSIS — E559 Vitamin D deficiency, unspecified: Secondary | ICD-10-CM | POA: Diagnosis not present

## 2024-09-06 ENCOUNTER — Other Ambulatory Visit: Payer: Self-pay | Admitting: Nurse Practitioner

## 2024-09-06 DIAGNOSIS — Z0001 Encounter for general adult medical examination with abnormal findings: Secondary | ICD-10-CM
